# Patient Record
Sex: Female | Born: 1995 | Hispanic: Yes | Marital: Married | Smoking: Never smoker
Health system: Southern US, Community
[De-identification: ages and names within clinical notes are randomized; demographics above are authoritative.]

## PROBLEM LIST (undated history)

## (undated) DIAGNOSIS — Z789 Other specified health status: Secondary | ICD-10-CM

## (undated) DIAGNOSIS — E785 Hyperlipidemia, unspecified: Secondary | ICD-10-CM

## (undated) HISTORY — DX: Hyperlipidemia, unspecified: E78.5

## (undated) HISTORY — PX: WISDOM TOOTH EXTRACTION: SHX21

---
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
## 2015-05-11 ENCOUNTER — Encounter: Payer: Self-pay | Admitting: Pediatrics

## 2015-05-11 ENCOUNTER — Ambulatory Visit (INDEPENDENT_AMBULATORY_CARE_PROVIDER_SITE_OTHER): Payer: Medicaid Other | Admitting: Pediatrics

## 2015-05-11 VITALS — BP 100/60 | Ht 62.01 in | Wt 143.0 lb

## 2015-05-11 DIAGNOSIS — Z00121 Encounter for routine child health examination with abnormal findings: Secondary | ICD-10-CM | POA: Diagnosis not present

## 2015-05-11 DIAGNOSIS — Z113 Encounter for screening for infections with a predominantly sexual mode of transmission: Secondary | ICD-10-CM

## 2015-05-11 DIAGNOSIS — Z23 Encounter for immunization: Secondary | ICD-10-CM

## 2015-05-11 DIAGNOSIS — Z68.41 Body mass index (BMI) pediatric, 5th percentile to less than 85th percentile for age: Secondary | ICD-10-CM

## 2015-05-11 NOTE — Progress Notes (Signed)
[id number] Routine Well-Adolescent Visit  Dorreen's personal or confidential phone number: [phone]  PCP: Kela MillinBARRINO, ALETHEA Y, MD   History was provided by the patient.Patient unaccompanied  Genia Delrika Sanchez-Gonzalez is a 19 y.o. female who is here for well check.   Current concerns: none   Adolescent Assessment:  Confidentiality was discussed with the patient and if applicable, with caregiver as well.  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 Yes  Smoking: no Secondhand smoke exposure?  Drugs/EtOH: no   Sexuality:  -Menarche: age11 - females:  last menses:   - Sexually active? no  - sexual partners in last year:  - contraception use: abstinence - Last STI Screening: no  - Violence/Abuse:   Mood: Suicidality and Depression: denies depression Weapons:   Screenings: The patient completed the Rapid Assessment for Adolescent Preventive Services screening questionnaire and the following topics were identified as risk factors and discussed: mental health issues  In addition, the following topics were discussed as part of anticipatory guidance birth control.  PHQ-9 completed and results indicated mild risk for depression =scored 8 pt feels not depressed. Tired form work and school. Decline counseling   Hearing Screening   125Hz  250Hz  500Hz  1000Hz  2000Hz  4000Hz  8000Hz   Right ear:   20 20 20 20    Left ear:   20 20 20 20      Visual Acuity Screening   Right eye Left eye Both eyes  Without correction: 20/50 20/70   With correction:     Comments: Patient forgot glasses  BP 100/60 mmHg  Ht 5' 2.01" (1.575 m)  Wt 143 lb (64.864 kg)  BMI 26.15  kg/m2  BP 100/60 mmHg  Ht 5' 2.01" (1.575 m)  Wt 143 lb (64.864 kg)  BMI 26.15 kg/m2   Physical Exam:  BP 100/60 mmHg  Ht 5' 2.01" (1.575 m)  Wt 143 lb (64.864 kg)  BMI 26.15 kg/m2 Blood pressure percentiles are 20% systolic and 35% diastolic based on 2000 NHANES data. BP 100/60 mmHg  Ht 5' 2.01" (1.575 m)  Wt 143 lb (64.864 kg)  BMI 26.15 kg/m2   Objective:         General alert in NAD  Derm   no rashes or lesions  Head Normocephalic, atraumatic                    Eyes Normal, no discharge  Ears:   TMs normal bilaterally  Nose:   patent normal mucosa, turbinates normal, no rhinorhea  Oral cavity  moist mucous membranes, no lesions  Throat:   normal tonsils, without exudate or erythema  Neck:   .supple no significant adenopathy  Lungs:  clear with equal breath sounds bilaterally  Breast Tanner5  Heart:   regular rate and rhythm, no murmur  Abdomen:  soft nontender no organomegaly or masses  GU:  normal female Tanner 5  back No deformity  Extremities:   no deformity  Neuro:  intact no focal defects          Assessment/Plan: 1. Need for prophylactic vaccination and inoculation against unspecified single disease  - Hepatitis A vaccine pediatric / adolescent 2 dose IM - HPV vaccine quadravalent 3 dose IM -  Meningococcal conjugate vaccine 4-valent IM  2. Body mass index, pediatric, 5th percentile to less than 85th percentile for age  153. Routine screening for STI (sexually transmitted infection)  - GC/chlamydia probe amp, urineBMI: is appropriate for age  Immunizations today: per orders.  - Follow-up visit in 1 year for next visit, or sooner as needed.   Carma LeavenMary Jo Victoria Henshaw, MD

## 2015-05-11 NOTE — Patient Instructions (Signed)

## 2015-05-14 LAB — GC/CHLAMYDIA PROBE AMP, URINE
Chlamydia, Swab/Urine, PCR: NEGATIVE
GC Probe Amp, Urine: NEGATIVE

## 2015-08-16 ENCOUNTER — Ambulatory Visit (INDEPENDENT_AMBULATORY_CARE_PROVIDER_SITE_OTHER): Payer: Medicaid Other | Admitting: Pediatrics

## 2015-08-16 ENCOUNTER — Encounter (INDEPENDENT_AMBULATORY_CARE_PROVIDER_SITE_OTHER): Payer: Self-pay

## 2015-08-16 VITALS — Temp 97.8°F | Wt 145.2 lb

## 2015-08-16 DIAGNOSIS — Z23 Encounter for immunization: Secondary | ICD-10-CM

## 2015-08-16 NOTE — Progress Notes (Signed)
No chief complaint on file.   HPI Zoraya Sanchez-Gonzalezis here forHPV #3  History was provided by the . patient. No problems with past imminization, no concerns today  family history is not on file.   Temp(Src) 97.8 F (36.6 C)  Wt 145 lb 3.2 oz (65.862 kg)    Objective:         General alert in NAD  Derm   no rashes or lesions  Head Normocephalic, atraumatic                    Eyes Normal, no discharge  Ears:   TMs normal bilaterally  Nose:   patent normal mucosa, turbinates normal, no rhinorhea  Oral cavity  moist mucous membranes, no lesions  Throat:   normal tonsils, without exudate or erythema  Neck supple FROM  Lymph:   no significant cervicaladenopathy  Lungs:  clear with equal breath sounds bilaterally  Heart:   regular rate and rhythm, no murmur  Abdomen:  deferred  GU:  deferred  back No deformity  Extremities:   no deformity  :  i        Assessment/plan    1. Need for vaccination  - HPV 9-valent vaccine,Recombinat    Follow up  prn

## 2016-11-26 ENCOUNTER — Encounter: Payer: Self-pay | Admitting: Pediatrics

## 2016-11-27 ENCOUNTER — Encounter: Payer: Medicaid Other | Admitting: Pediatrics

## 2017-09-02 ENCOUNTER — Emergency Department (HOSPITAL_COMMUNITY)
Admission: EM | Admit: 2017-09-02 | Discharge: 2017-09-02 | Disposition: A | Discharge status: Home / Self Care | Payer: Self-pay | Attending: Emergency Medicine | Admitting: Emergency Medicine

## 2017-09-02 ENCOUNTER — Emergency Department (HOSPITAL_COMMUNITY): Payer: Self-pay

## 2017-09-02 ENCOUNTER — Encounter (HOSPITAL_COMMUNITY): Payer: Self-pay | Admitting: Emergency Medicine

## 2017-09-02 DIAGNOSIS — S60012A Contusion of left thumb without damage to nail, initial encounter: Secondary | ICD-10-CM | POA: Insufficient documentation

## 2017-09-02 DIAGNOSIS — Y939 Activity, unspecified: Secondary | ICD-10-CM | POA: Insufficient documentation

## 2017-09-02 DIAGNOSIS — Y999 Unspecified external cause status: Secondary | ICD-10-CM | POA: Insufficient documentation

## 2017-09-02 DIAGNOSIS — S60312A Abrasion of left thumb, initial encounter: Secondary | ICD-10-CM | POA: Insufficient documentation

## 2017-09-02 DIAGNOSIS — Y929 Unspecified place or not applicable: Secondary | ICD-10-CM | POA: Insufficient documentation

## 2017-09-02 DIAGNOSIS — W230XXA Caught, crushed, jammed, or pinched between moving objects, initial encounter: Secondary | ICD-10-CM | POA: Insufficient documentation

## 2017-09-02 DIAGNOSIS — R52 Pain, unspecified: Secondary | ICD-10-CM

## 2017-09-02 NOTE — Discharge Instructions (Signed)
The x-ray of your left thumb is negative for fracture or dislocation. Your examination is negative for any vascular or nerve related issue. I suspect that you have a contusion to the thumb, as well as an abrasion to the thumb. Please keep your hand elevated as much as possible. Place a clean bandage on the abrasion area daily. Please return to the verge department if any changes, problems, or concerns.
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
## 2017-09-02 NOTE — ED Triage Notes (Signed)
Pt c/o left thumb pain after slamming it in car door.

## 2017-09-02 NOTE — ED Notes (Signed)
Pt ambulatory to waiting room. Pt verbalized understanding of discharge instructions.   

## 2018-12-26 DIAGNOSIS — B360 Pityriasis versicolor: Secondary | ICD-10-CM | POA: Diagnosis not present

## 2018-12-26 DIAGNOSIS — Z01411 Encounter for gynecological examination (general) (routine) with abnormal findings: Secondary | ICD-10-CM | POA: Diagnosis not present

## 2018-12-26 DIAGNOSIS — Z6831 Body mass index (BMI) 31.0-31.9, adult: Secondary | ICD-10-CM | POA: Diagnosis not present

## 2018-12-26 DIAGNOSIS — Z1389 Encounter for screening for other disorder: Secondary | ICD-10-CM | POA: Diagnosis not present

## 2018-12-26 DIAGNOSIS — E6609 Other obesity due to excess calories: Secondary | ICD-10-CM | POA: Diagnosis not present

## 2018-12-26 DIAGNOSIS — L209 Atopic dermatitis, unspecified: Secondary | ICD-10-CM | POA: Diagnosis not present

## 2019-10-06 ENCOUNTER — Telehealth: Payer: Self-pay | Admitting: Advanced Practice Midwife

## 2019-10-06 NOTE — Telephone Encounter (Signed)

## 2019-10-07 ENCOUNTER — Encounter: Payer: Self-pay | Admitting: Women's Health

## 2019-10-07 ENCOUNTER — Ambulatory Visit (INDEPENDENT_AMBULATORY_CARE_PROVIDER_SITE_OTHER): Payer: Self-pay | Admitting: Adult Health

## 2019-10-07 ENCOUNTER — Other Ambulatory Visit: Payer: Self-pay

## 2019-10-07 VITALS — BP 129/85 | HR 95 | Ht 62.0 in | Wt 171.0 lb

## 2019-10-07 DIAGNOSIS — Z3202 Encounter for pregnancy test, result negative: Secondary | ICD-10-CM | POA: Insufficient documentation

## 2019-10-07 DIAGNOSIS — Z319 Encounter for procreative management, unspecified: Secondary | ICD-10-CM | POA: Insufficient documentation

## 2019-10-07 DIAGNOSIS — R1032 Left lower quadrant pain: Secondary | ICD-10-CM | POA: Insufficient documentation

## 2019-10-07 LAB — POCT URINE PREGNANCY: Preg Test, Ur: NEGATIVE

## 2019-10-07 NOTE — Progress Notes (Signed)
Patient ID: Catherine Park, female   DOB: 1996-05-04, 23 y.o.   MRN: [id number] History of Present Illness: Catherine Park is a 23 year old Hispanic female, married, G0P0 in complaining of pain in left ovary area for about a week,radiates to back, noticed after massage.Her periods are every 4-5 weeks, has some cramping and has spotted after sex like 2 times.She would like to get pregnant.Her husband is a Airline pilot. She works at [employer]. PCP is [place].   Current Medications, Allergies, Past Medical History, Past Surgical History, Family History and Social History were reviewed in [employer] record.     Review of Systems: +pain in left ovary area No pain with sex    Physical Exam:BP 129/85 (BP Location: Right Arm, Patient Position: Sitting, Cuff Size: Normal)   Pulse 95   Ht 5\' 2"  (1.575 m)   Wt 171 lb (77.6 kg)   LMP 10/05/2019 Comment: PMP 08/29/2019  BMI 31.28 kg/m UPT is negative.  General:  Well developed, well nourished, no acute distress Skin:  Warm and dry Neck:  Midline trachea, normal thyroid, good ROM, no lymphadenopathy Lungs; Clear to auscultation bilaterally Breast:  No dominant palpable mass, retraction, or nipple discharge Cardiovascular: Regular rate and rhythm Abdomen:  Soft, non tender, no hepatosplenomegaly Pelvic:  External genitalia is normal in appearance, no lesions.  The vagina is normal in appearance.+blood in vault Urethra has no lesions or masses. The cervix is smooth, no CMT.  Uterus is felt to be normal size, shape, and contour.  No adnexal masses. +LLQ tenderness noted.Bladder is non tender, no masses felt. Psych:  No mood changes, alert and cooperative,seems happy Fall risk is low PHQ 2 score is 0. Examination chaperoned and assisted by Weyman Croon FNP student.   Impression and Plan: 1. Pregnancy test negative   2. LLQ pain -will get GYN [place] next week to assess ovaries, will talk when results back, but suspect a cyst, and  she said her sister had to have one removed   3. Patient desires pregnancy -take PNV -can check progesterone level day 21 if -desired-discussed timing of sex, every other day 7-24 of cycle -maybe semen analysis for husband

## 2019-10-13 ENCOUNTER — Other Ambulatory Visit: Payer: Medicaid Other

## 2020-03-27 ENCOUNTER — Emergency Department (HOSPITAL_COMMUNITY)
Admission: EM | Admit: 2020-03-27 | Discharge: 2020-03-27 | Disposition: A | Discharge status: Home / Self Care | Payer: Self-pay | Attending: Emergency Medicine | Admitting: Emergency Medicine

## 2020-03-27 ENCOUNTER — Emergency Department (HOSPITAL_COMMUNITY): Payer: Self-pay

## 2020-03-27 ENCOUNTER — Encounter (HOSPITAL_COMMUNITY): Payer: Self-pay | Admitting: Emergency Medicine

## 2020-03-27 ENCOUNTER — Other Ambulatory Visit: Payer: Self-pay

## 2020-03-27 DIAGNOSIS — Y990 Civilian activity done for income or pay: Secondary | ICD-10-CM | POA: Insufficient documentation

## 2020-03-27 DIAGNOSIS — Y92512 Supermarket, store or market as the place of occurrence of the external cause: Secondary | ICD-10-CM | POA: Insufficient documentation

## 2020-03-27 DIAGNOSIS — W2209XA Striking against other stationary object, initial encounter: Secondary | ICD-10-CM | POA: Insufficient documentation

## 2020-03-27 DIAGNOSIS — Y939 Activity, unspecified: Secondary | ICD-10-CM | POA: Insufficient documentation

## 2020-03-27 DIAGNOSIS — S51812A Laceration without foreign body of left forearm, initial encounter: Secondary | ICD-10-CM | POA: Insufficient documentation

## 2020-03-27 DIAGNOSIS — S5002XA Contusion of left elbow, initial encounter: Secondary | ICD-10-CM | POA: Insufficient documentation

## 2020-03-27 DIAGNOSIS — Z23 Encounter for immunization: Secondary | ICD-10-CM | POA: Insufficient documentation

## 2020-03-27 LAB — POC URINE PREG, ED: Preg Test, Ur: NEGATIVE

## 2020-03-27 MED ORDER — IBUPROFEN 600 MG PO TABS: 600.0000 mg | ORAL_TABLET | Freq: Four times a day (QID) | ORAL | 0 refills | Status: DC | PRN

## 2020-03-27 MED ORDER — LIDOCAINE HCL (PF) 2 % IJ SOLN
5.0000 mL | Freq: Once | INTRAMUSCULAR | Status: AC
  Administered 2020-03-27: 5 mL

## 2020-03-27 MED ORDER — TETANUS-DIPHTH-ACELL PERTUSSIS 5-2.5-18.5 LF-MCG/0.5 IM SUSP
0.5000 mL | Freq: Once | INTRAMUSCULAR | Status: AC
  Administered 2020-03-27: 18:00:00 0.5 mL via INTRAMUSCULAR
  Filled 2020-03-27: qty 0.5

## 2020-03-27 NOTE — ED Notes (Signed)
To Rad 

## 2020-03-27 NOTE — ED Notes (Signed)
Out of bed to BR with easy, fluid gait
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
## 2020-03-27 NOTE — ED Notes (Signed)
From rad   JI PA in to evaluate
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
## 2020-03-27 NOTE — Discharge Instructions (Addendum)
Have your sutures removed in 10 days.  Keep your wound clean and dry,  Until a good scab forms - you may then wash gently twice daily with mild soap and water, but dry completely after.  Get rechecked for any sign of infection (redness,  Swelling,  Increased pain or drainage of purulent fluid).  Use ice packs as much as is comfortable for your aching joints and elbow hematoma - this will help with pain and swelling.
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
## 2020-03-31 ENCOUNTER — Telehealth: Payer: Self-pay | Admitting: Adult Health

## 2020-03-31 NOTE — Telephone Encounter (Signed)
Tried to reach the patient to remind her of her appointment/restrictions, mailbox is full. 

## 2020-04-01 ENCOUNTER — Encounter: Payer: Self-pay | Admitting: Adult Health

## 2020-04-01 ENCOUNTER — Ambulatory Visit (INDEPENDENT_AMBULATORY_CARE_PROVIDER_SITE_OTHER): Payer: Self-pay | Admitting: Adult Health

## 2020-04-01 ENCOUNTER — Other Ambulatory Visit: Payer: Self-pay

## 2020-04-01 VITALS — BP 111/79 | HR 81 | Ht 62.0 in | Wt 172.0 lb

## 2020-04-01 DIAGNOSIS — N760 Acute vaginitis: Secondary | ICD-10-CM

## 2020-04-01 DIAGNOSIS — N930 Postcoital and contact bleeding: Secondary | ICD-10-CM

## 2020-04-01 DIAGNOSIS — R1032 Left lower quadrant pain: Secondary | ICD-10-CM

## 2020-04-01 DIAGNOSIS — Z3202 Encounter for pregnancy test, result negative: Secondary | ICD-10-CM

## 2020-04-01 DIAGNOSIS — N926 Irregular menstruation, unspecified: Secondary | ICD-10-CM

## 2020-04-01 DIAGNOSIS — B9689 Other specified bacterial agents as the cause of diseases classified elsewhere: Secondary | ICD-10-CM

## 2020-04-01 LAB — POCT WET PREP (WET MOUNT)
Clue Cells Wet Prep Whiff POC: NEGATIVE
WBC, Wet Prep HPF POC: POSITIVE

## 2020-04-01 LAB — POCT URINE PREGNANCY: Preg Test, Ur: NEGATIVE

## 2020-04-01 MED ORDER — METRONIDAZOLE 500 MG PO TABS: 500.0000 mg | ORAL_TABLET | Freq: Two times a day (BID) | ORAL | 0 refills | Status: DC
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
## 2020-04-04 ENCOUNTER — Telehealth: Payer: Self-pay | Admitting: Obstetrics & Gynecology

## 2020-04-04 NOTE — Telephone Encounter (Signed)

## 2020-04-06 ENCOUNTER — Ambulatory Visit (INDEPENDENT_AMBULATORY_CARE_PROVIDER_SITE_OTHER): Payer: Self-pay

## 2020-04-06 ENCOUNTER — Other Ambulatory Visit: Payer: Self-pay

## 2020-04-06 DIAGNOSIS — R1032 Left lower quadrant pain: Secondary | ICD-10-CM

## 2020-04-06 NOTE — Progress Notes (Signed)
PELVIC US TA/TV: homogeneous anteverted uterus,wnl,EEC 4.8 mm, normal ovaries,ovaries appear mobile,no free fluid,no pain during ultrasound

## 2020-04-07 ENCOUNTER — Telehealth: Payer: Self-pay | Admitting: Adult Health

## 2020-04-07 NOTE — Telephone Encounter (Signed)
Pt aware that GYN [place] was normal.

## 2020-10-16 IMAGING — DX DG SHOULDER 2+V*L*
3 series · 3 of 3 positions shown · non-contrast
Comparison: None.

CLINICAL DATA: Laceration

EXAM:
LEFT SHOULDER - 2+ VIEW

[shoulder grashey]
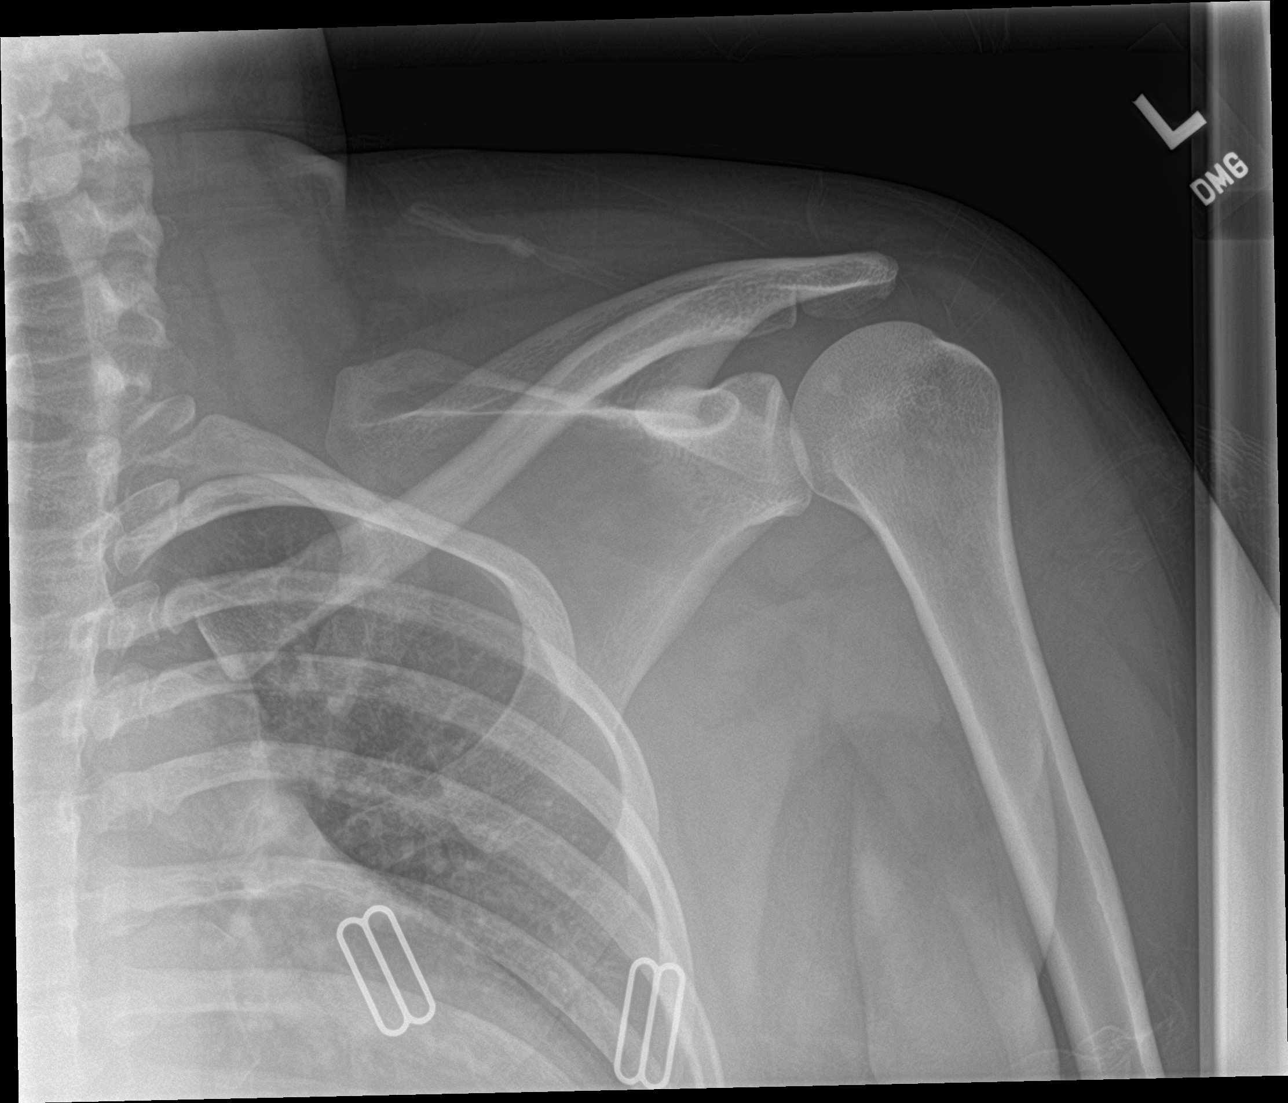

[shoulder y view]
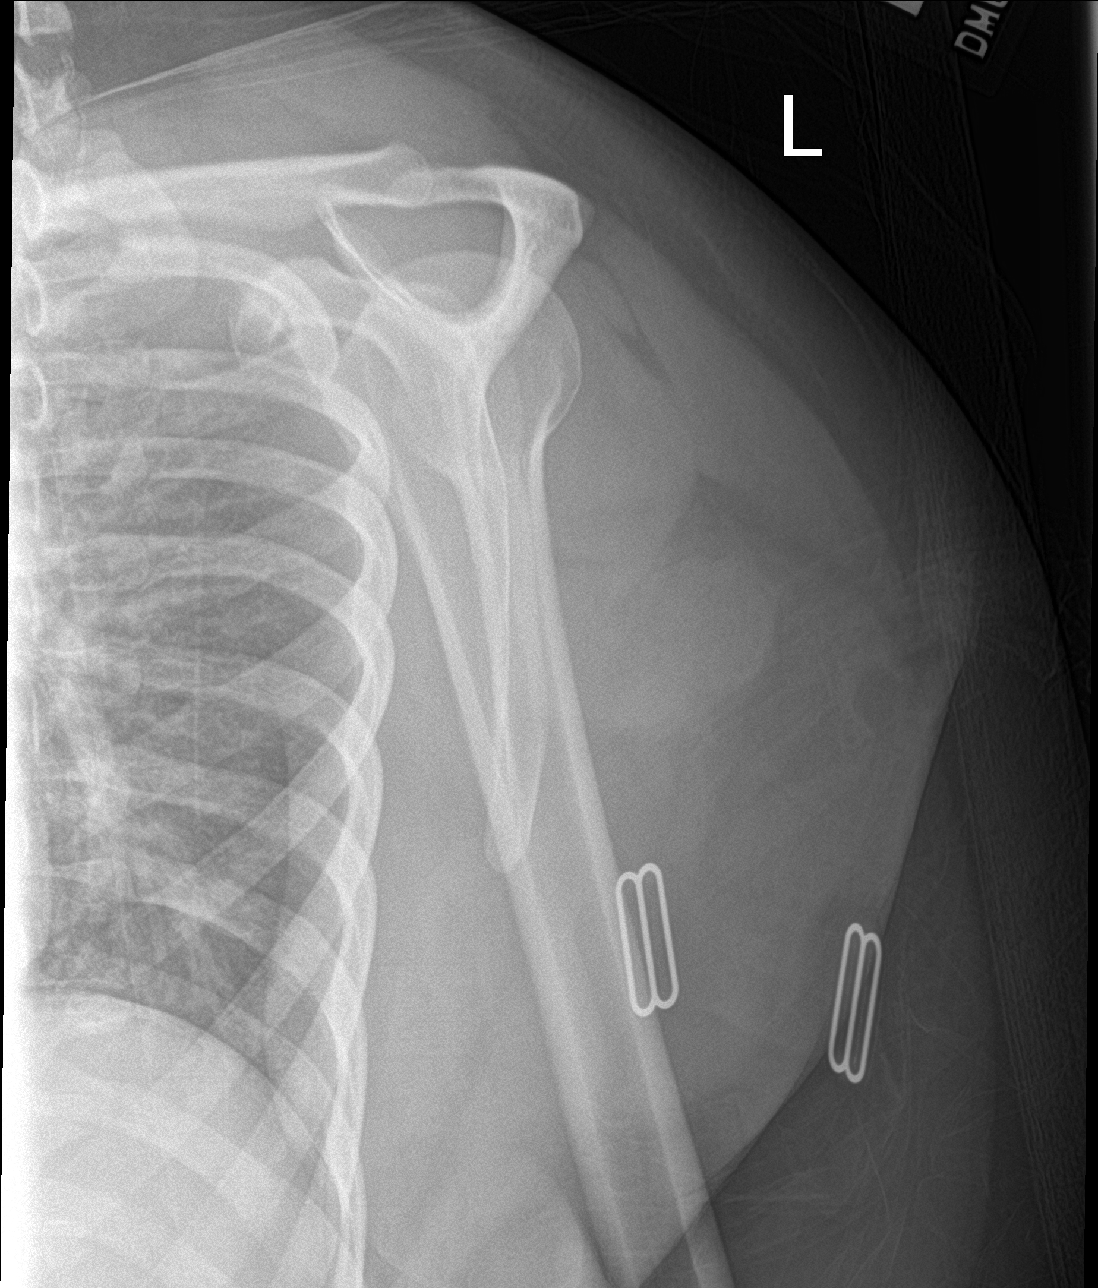

[shoulder axillary]
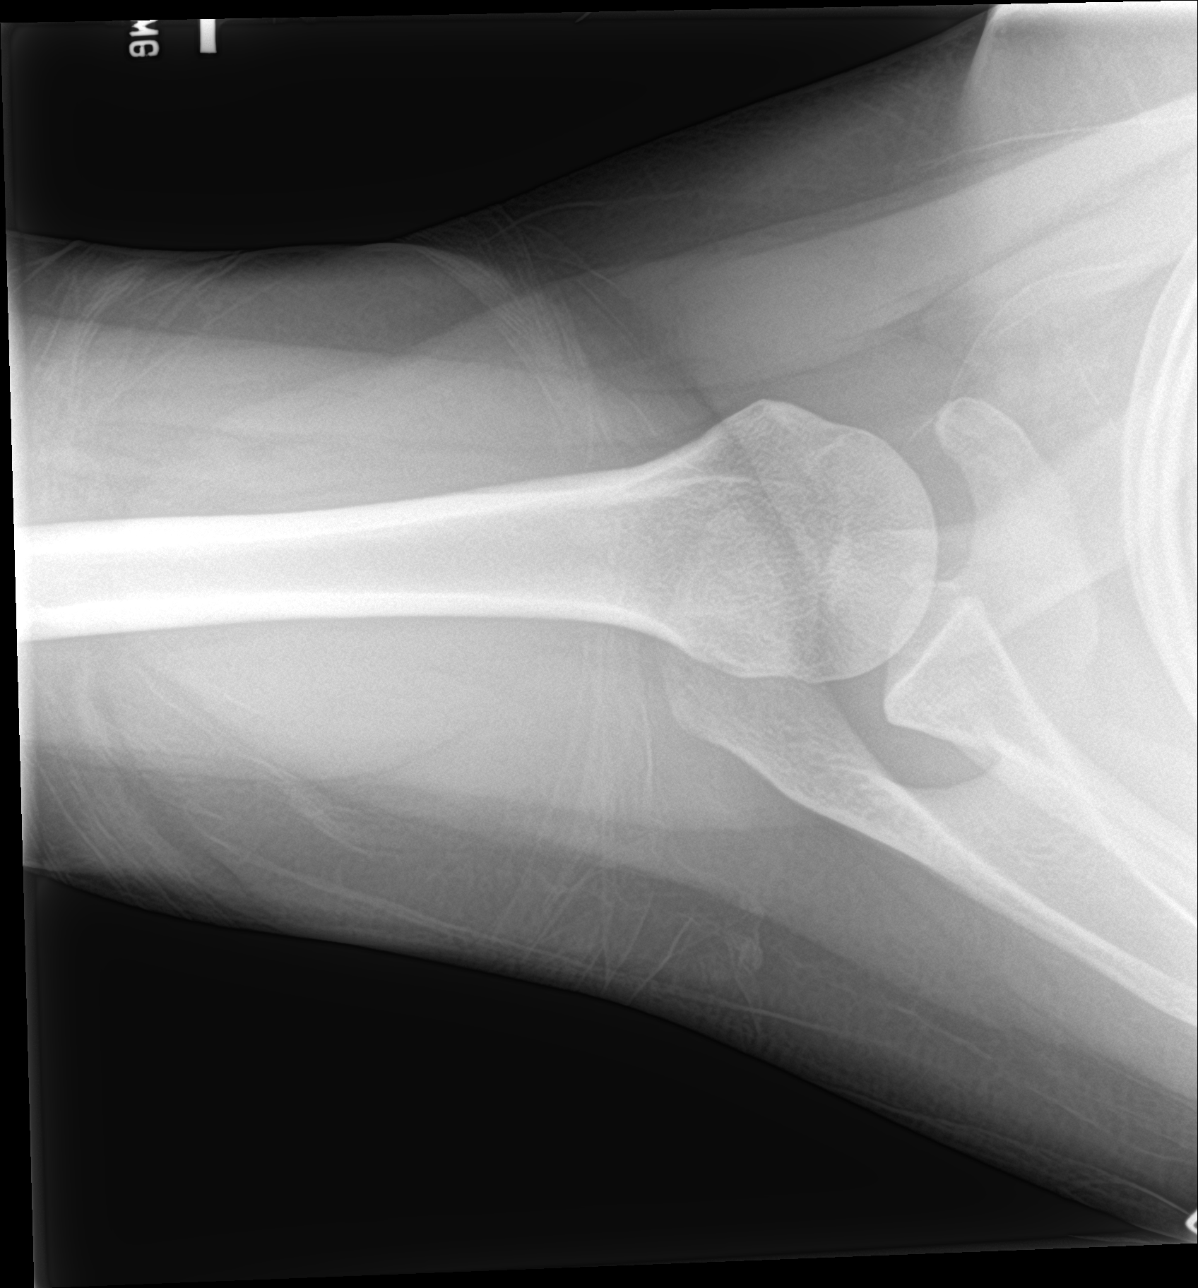

[3 of 3 positions shown; findings below may reference images not displayed]

FINDINGS: There is no evidence of fracture or dislocation. There is no
evidence of arthropathy or other focal bone abnormality. Soft
tissues are unremarkable.
IMPRESSION: No fracture or dislocation of the left shoulder.

## 2020-10-16 IMAGING — DX DG HIP (WITH OR WITHOUT PELVIS) 3-4V BILAT
5 series · 5 of 5 positions shown · non-contrast
Comparison: None.

CLINICAL DATA: Laceration

EXAM:
DG HIP (WITH OR WITHOUT PELVIS) 3-4V BILAT

[pelvis ap]
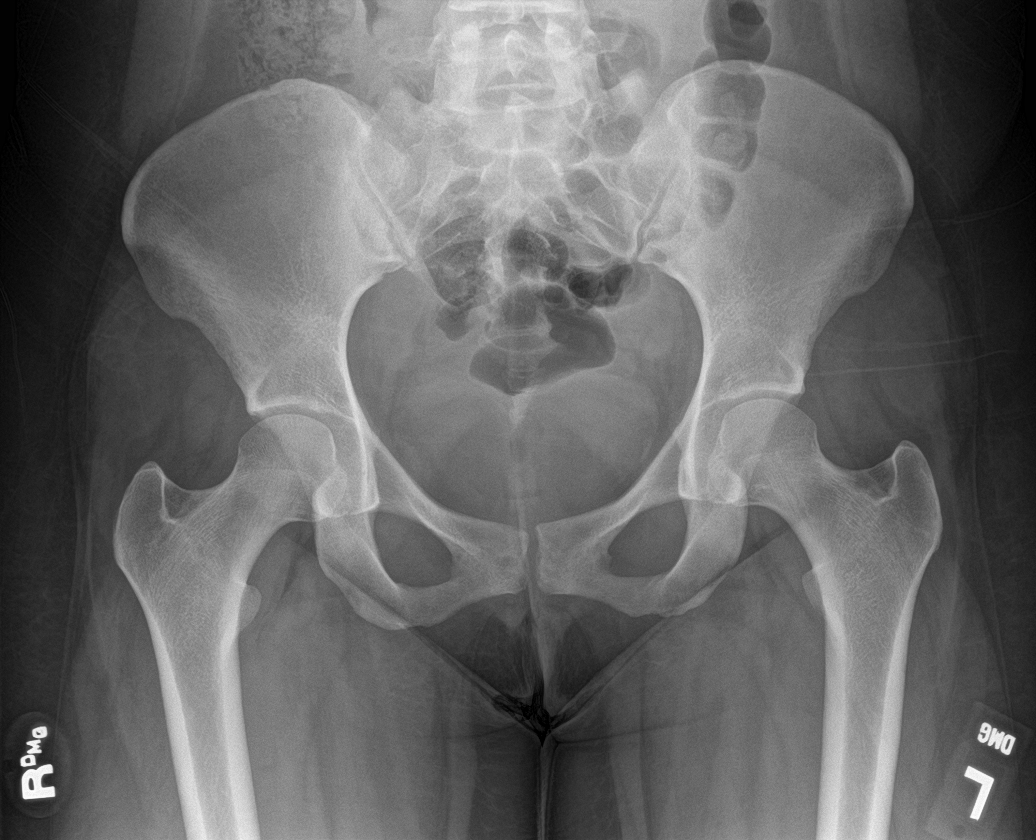

[hip ap (1 of 2)]
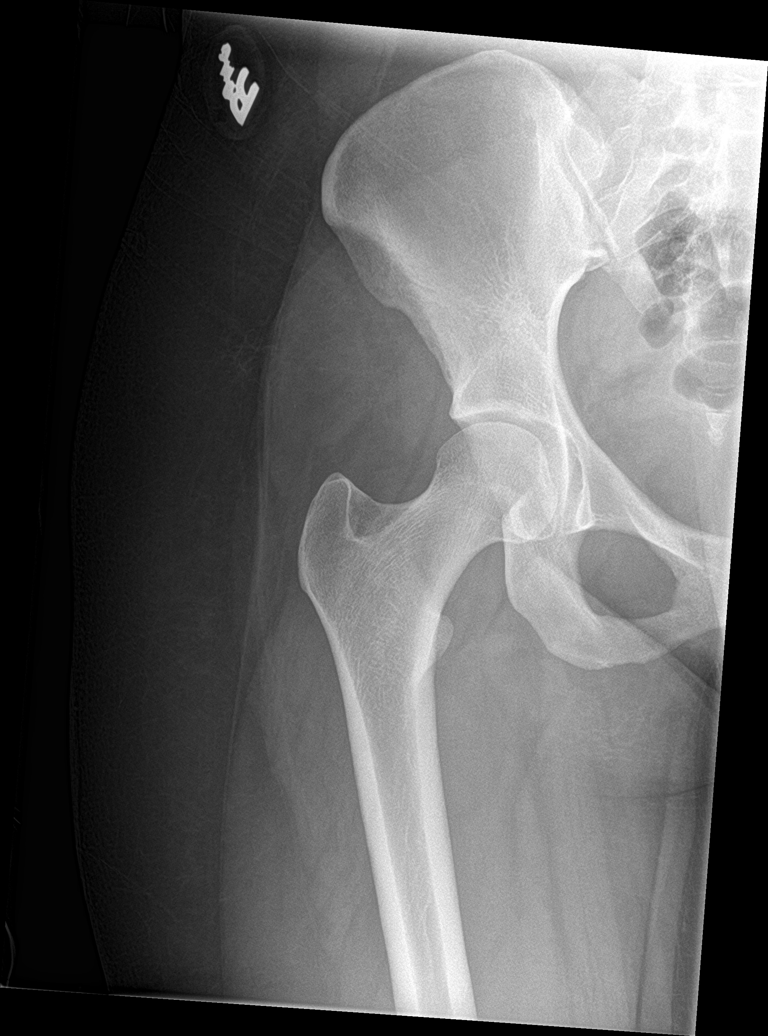

[hip lat (1 of 2)]
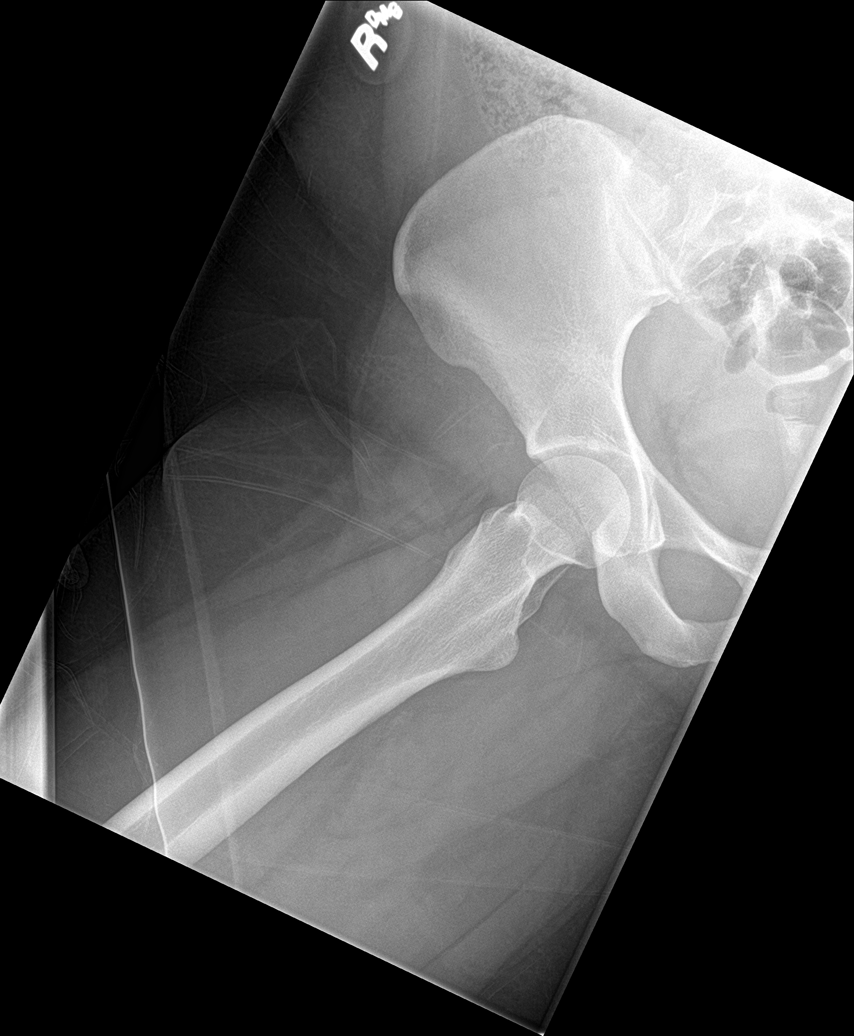

[hip ap (2 of 2)]
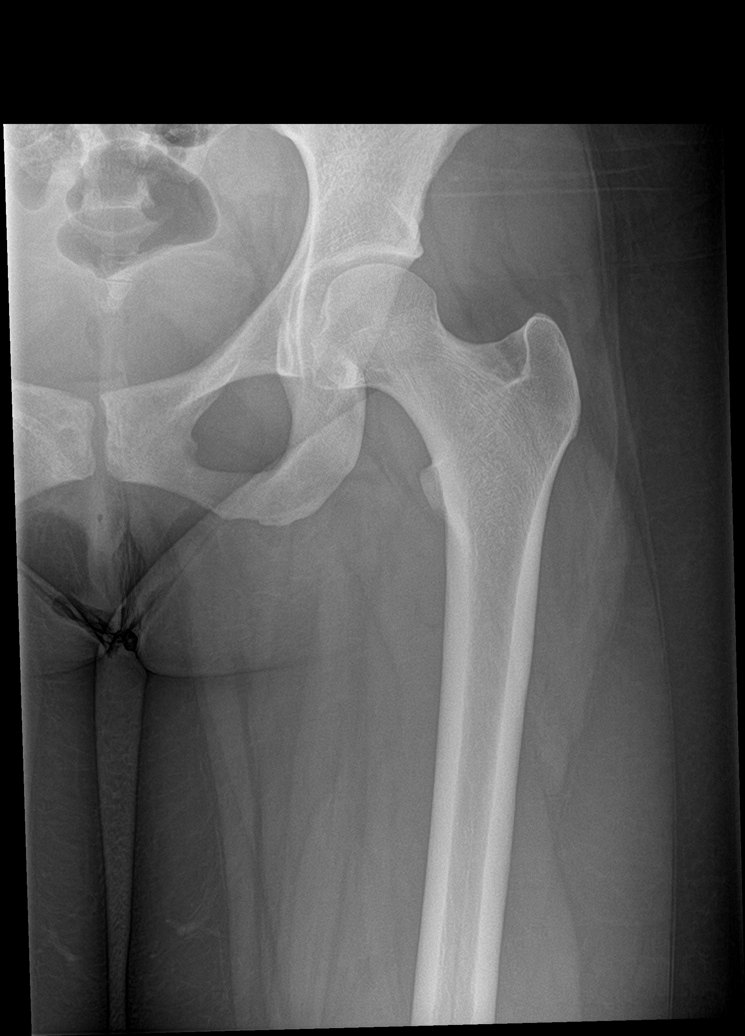

[hip lat (2 of 2)]
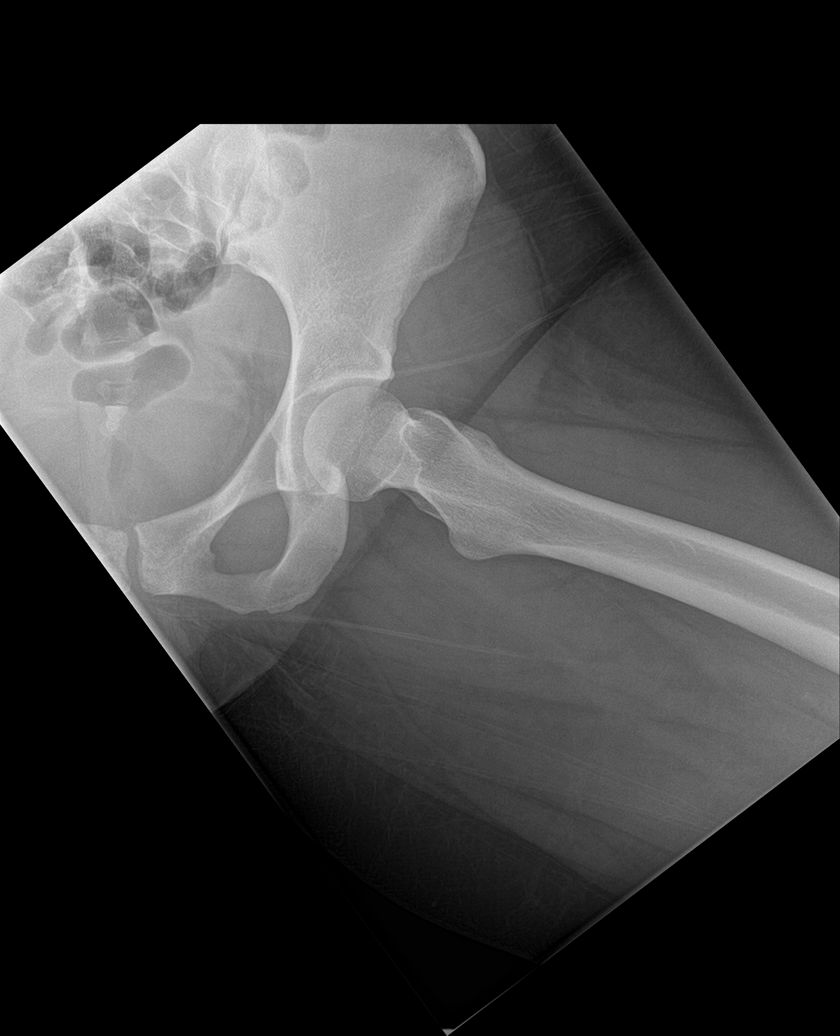

[5 of 5 positions shown; findings below may reference images not displayed]

FINDINGS: There is no evidence of hip fracture or dislocation. There is no
evidence of arthropathy or other focal bone abnormality.
IMPRESSION: No fracture or dislocation of the hips or pelvis.

## 2020-12-24 NOTE — L&D Delivery Note (Signed)
LABOR COURSE Catherine Park is a 25 y.o. female G1P0000 with IUP at [redacted]w[redacted]d by 8 week [place] presenting for SOL. She progressed in spontaneous labor and also underwent AROM.   Delivery Note Called to room and patient was complete and pushing. Head delivered spontaneously. Loose nuchal cord present, delivered through. Shoulder and body delivered in usual fashion. At  0847 a viable and healthy female was delivered via Vaginal, Spontaneous (Presentation: vertex; LOA).  Infant with spontaneous cry, placed on mother's abdomen, dried and stimulated. Cord clamped x 2 after 1-minute delay, and cut by FOB. Cord blood drawn. Placenta delivered spontaneously with gentle cord traction. Appears intact. Fundus firm with massage and Pitocin. Labia, perineum, vagina, and cervix inspected with right vaginal wall abrasion not requiring repair.    APGAR: 9, 10; weight  pending.   Cord: 3VC with the following complications:none.   Cord pH: n/a  Anesthesia: None Episiotomy: None Lacerations: Right vaginal wall abrasion, no repair needed Suture Repair:  n/a Est. Blood Loss (mL): 100  Mom to postpartum.  Baby to Couplet care / Skin to Skin.  Reeves Forth, MD 11/07/21 9:51 AM

## 2021-03-22 ENCOUNTER — Other Ambulatory Visit: Payer: Self-pay

## 2021-03-22 ENCOUNTER — Ambulatory Visit (INDEPENDENT_AMBULATORY_CARE_PROVIDER_SITE_OTHER): Payer: Self-pay

## 2021-03-22 VITALS — BP 128/83 | HR 88 | Ht 62.0 in | Wt 167.6 lb

## 2021-03-22 DIAGNOSIS — N926 Irregular menstruation, unspecified: Secondary | ICD-10-CM

## 2021-03-22 LAB — POCT URINE PREGNANCY: Preg Test, Ur: POSITIVE — AB

## 2021-03-22 NOTE — Progress Notes (Addendum)
   NURSE VISIT- PREGNANCY CONFIRMATION   SUBJECTIVE:  Catherine Park is a 25 y.o. G28P0000 female at [redacted]w[redacted]d by certain LMP of Patient's last menstrual period was 01/30/2021 (exact date). Here for pregnancy confirmation.  Home pregnancy test: positive x 2  She reports cramping.  She is not taking prenatal vitamins.    OBJECTIVE:  BP 128/83 (BP Location: Right Arm, Patient Position: Sitting, Cuff Size: Normal)   Pulse 88   Ht 5\' 2"  (1.575 m)   Wt 167 lb 9.6 oz (76 kg)   LMP 01/30/2021 (Exact Date)   BMI 30.65 kg/m   Appears well, in no apparent distress OB History  Gravida Para Term Preterm AB Living  1 0 0 0 0 0  SAB IAB Ectopic Multiple Live Births  0 0 0 0 0    # Outcome Date GA Lbr Len/2nd Weight Sex Delivery Anes PTL Lv  1 Current             Results for orders placed or performed in visit on 03/22/21 (from the past 24 hour(s))  POCT urine pregnancy   Collection Time: 03/22/21  4:20 PM  Result Value Ref Range   Preg Test, Ur Positive (A) Negative    ASSESSMENT: Positive pregnancy test, [redacted]w[redacted]d by LMP    PLAN: Schedule for dating ultrasound in 1 week Prenatal vitamins: plans to begin OTC ASAP   Nausea medicines: not currently needed   OB packet given: Yes  Bain Whichard A Lorrin Bodner  03/22/2021 4:29 PM   Chart reviewed for nurse visit. Agree with plan of care.  03/24/2021, Cheral Marker 03/23/2021 9:43 AM

## 2021-03-24 DIAGNOSIS — Z419 Encounter for procedure for purposes other than remedying health state, unspecified: Secondary | ICD-10-CM | POA: Diagnosis not present

## 2021-04-05 ENCOUNTER — Other Ambulatory Visit: Payer: Self-pay | Admitting: Obstetrics & Gynecology

## 2021-04-05 ENCOUNTER — Ambulatory Visit (INDEPENDENT_AMBULATORY_CARE_PROVIDER_SITE_OTHER): Payer: Medicaid Other

## 2021-04-05 ENCOUNTER — Other Ambulatory Visit: Payer: Self-pay

## 2021-04-05 DIAGNOSIS — O3680X0 Pregnancy with inconclusive fetal viability, not applicable or unspecified: Secondary | ICD-10-CM | POA: Diagnosis not present

## 2021-04-05 NOTE — Progress Notes (Signed)
[place] 8+2 wks,single IUP w/ys,fht 162 bpm,normal ovaries,crl 18.33 mm

## 2021-04-06 ENCOUNTER — Other Ambulatory Visit: Payer: Medicaid Other

## 2021-04-23 DIAGNOSIS — Z419 Encounter for procedure for purposes other than remedying health state, unspecified: Secondary | ICD-10-CM | POA: Diagnosis not present

## 2021-04-24 DIAGNOSIS — Z3401 Encounter for supervision of normal first pregnancy, first trimester: Secondary | ICD-10-CM | POA: Diagnosis not present

## 2021-05-09 ENCOUNTER — Other Ambulatory Visit: Payer: Self-pay | Admitting: Obstetrics & Gynecology

## 2021-05-09 DIAGNOSIS — Z3400 Encounter for supervision of normal first pregnancy, unspecified trimester: Secondary | ICD-10-CM | POA: Insufficient documentation

## 2021-05-09 DIAGNOSIS — Z3682 Encounter for antenatal screening for nuchal translucency: Secondary | ICD-10-CM

## 2021-05-10 ENCOUNTER — Encounter: Payer: Self-pay | Admitting: Advanced Practice Midwife

## 2021-05-10 ENCOUNTER — Ambulatory Visit (INDEPENDENT_AMBULATORY_CARE_PROVIDER_SITE_OTHER): Payer: Medicaid Other | Admitting: Advanced Practice Midwife

## 2021-05-10 ENCOUNTER — Other Ambulatory Visit: Payer: Self-pay

## 2021-05-10 ENCOUNTER — Ambulatory Visit: Payer: Medicaid Other | Admitting: *Deleted

## 2021-05-10 ENCOUNTER — Other Ambulatory Visit: Payer: Medicaid Other

## 2021-05-10 VITALS — BP 132/55 | HR 82 | Wt 164.0 lb

## 2021-05-10 DIAGNOSIS — Z3A13 13 weeks gestation of pregnancy: Secondary | ICD-10-CM

## 2021-05-10 DIAGNOSIS — Z3401 Encounter for supervision of normal first pregnancy, first trimester: Secondary | ICD-10-CM

## 2021-05-10 LAB — POCT URINALYSIS DIPSTICK OB
Blood, UA: NEGATIVE
Glucose, UA: NEGATIVE
Ketones, UA: NEGATIVE
Leukocytes, UA: NEGATIVE
Nitrite, UA: NEGATIVE
POC,PROTEIN,UA: NEGATIVE

## 2021-05-10 MED ORDER — BLOOD PRESSURE MONITOR MISC: 0 refills | Status: DC

## 2021-05-10 NOTE — Patient Instructions (Signed)
Catherine Park, I greatly value your feedback.  If you receive a survey following your visit with [place] today, we appreciate you taking the time to fill it out.  Thanks, [name] CNM   [facility] ([address] [place], [place] [address]) Entrance C, located off of [address] Free 24/7 valet parking   Nausea & Vomiting  Have saltine crackers or pretzels by your bed and eat a few bites before you raise your head out of bed in the morning  Eat small frequent meals throughout the day instead of large meals  Drink plenty of fluids throughout the day to stay hydrated, just don't drink a lot of fluids with your meals.  This can make your stomach fill up faster making you feel sick  Do not brush your teeth right after you eat  Products with real ginger are good for nausea, like ginger ale and ginger hard candy Make sure it says made with real ginger!  Sucking on sour candy like lemon heads is also good for nausea  If your prenatal vitamins make you nauseated, take them at night so you will sleep through the nausea  Sea Bands  If you feel like you need medicine for the nausea & vomiting please let us know  If you are unable to keep any fluids or food down please let us know   Constipation  Drink plenty of fluid, preferably water, throughout the day  Eat foods high in fiber such as fruits, vegetables, and grains  Exercise, such as walking, is a good way to keep your bowels regular  Drink warm fluids, especially warm prune juice, or decaf coffee  Eat a 1/2 cup of real oatmeal (not instant), 1/2 cup applesauce, and 1/2-1 cup warm prune juice every day  If needed, you may take Colace (docusate sodium) stool softener once or twice a day to help keep the stool soft.   If you still are having problems with constipation, you may take Miralax once daily as needed to help keep your bowels regular.   Home Blood Pressure Monitoring for Patients   Your  provider has recommended that you check your blood pressure (BP) at least once a week at home. If you do not have a blood pressure cuff at home, one will be provided for you. Contact your provider if you have not received your monitor within 1 week.   Helpful Tips for Accurate Home Blood Pressure Checks  . Don't smoke, exercise, or drink caffeine 30 minutes before checking your BP . Use the restroom before checking your BP (a full bladder can raise your pressure) . Relax in a comfortable upright chair . Feet on the ground . Left arm resting comfortably on a flat surface at the level of your heart . Legs uncrossed . Back supported . Sit quietly and don't talk . Place the cuff on your bare arm . Adjust snuggly, so that only two fingertips can fit between your skin and the top of the cuff . Check 2 readings separated by at least one minute . Keep a log of your BP readings . For a visual, please reference this diagram: http://ccnc.care/bpdiagram  Provider Name: [facility]     Phone: [phone]  Zone 1: ALL CLEAR  Continue to monitor your symptoms:  . BP reading is less than 140 (top number) or less than 90 (bottom number)  . No right upper stomach pain . No headaches or seeing spots .  No feeling nauseated or throwing up . No swelling in face and hands  Zone 2: CAUTION Call your doctor's office for any of the following:  . BP reading is greater than 140 (top number) or greater than 90 (bottom number)  . Stomach pain under your ribs in the middle or right side . Headaches or seeing spots . Feeling nauseated or throwing up . Swelling in face and hands  Zone 3: EMERGENCY  Seek immediate medical care if you have any of the following:  . BP reading is greater than160 (top number) or greater than 110 (bottom number) . Severe headaches not improving with Tylenol . Serious difficulty catching your breath . Any worsening symptoms from Zone 2    First Trimester of Pregnancy The  first trimester of pregnancy is from week 1 until the end of week 12 (months 1 through 3). A week after a sperm fertilizes an egg, the egg will implant on the wall of the uterus. This embryo will begin to develop into a baby. Genes from you and your partner are forming the baby. The female genes determine whether the baby is a boy or a girl. At 6-8 weeks, the eyes and face are formed, and the heartbeat can be seen on ultrasound. At the end of 12 weeks, all the baby's organs are formed.  Now that you are pregnant, you will want to do everything you can to have a healthy baby. Two of the most important things are to get good prenatal care and to follow your health care provider's instructions. Prenatal care is all the medical care you receive before the baby's birth. This care will help prevent, find, and treat any problems during the pregnancy and childbirth. BODY CHANGES Your body goes through many changes during pregnancy. The changes vary from woman to woman.   You may gain or lose a couple of pounds at first.  You may feel sick to your stomach (nauseous) and throw up (vomit). If the vomiting is uncontrollable, call your health care provider.  You may tire easily.  You may develop headaches that can be relieved by medicines approved by your health care provider.  You may urinate more often. Painful urination may mean you have a bladder infection.  You may develop heartburn as a result of your pregnancy.  You may develop constipation because certain hormones are causing the muscles that push waste through your intestines to slow down.  You may develop hemorrhoids or swollen, bulging veins (varicose veins).  Your breasts may begin to grow larger and become tender. Your nipples may stick out more, and the tissue that surrounds them (areola) may become darker.  Your gums may bleed and may be sensitive to brushing and flossing.  Dark spots or blotches (chloasma, mask of pregnancy) may develop on  your face. This will likely fade after the baby is born.  Your menstrual periods will stop.  You may have a loss of appetite.  You may develop cravings for certain kinds of food.  You may have changes in your emotions from day to day, such as being excited to be pregnant or being concerned that something may go wrong with the pregnancy and baby.  You may have more vivid and strange dreams.  You may have changes in your hair. These can include thickening of your hair, rapid growth, and changes in texture. Some women also have hair loss during or after pregnancy, or hair that feels dry or thin. Your hair will most  likely return to normal after your baby is born. WHAT TO EXPECT AT YOUR PRENATAL VISITS During a routine prenatal visit:  You will be weighed to make sure you and the baby are growing normally.  Your blood pressure will be taken.  Your abdomen will be measured to track your baby's growth.  The fetal heartbeat will be listened to starting around week 10 or 12 of your pregnancy.  Test results from any previous visits will be discussed. Your health care provider may ask you:  How you are feeling.  If you are feeling the baby move.  If you have had any abnormal symptoms, such as leaking fluid, bleeding, severe headaches, or abdominal cramping.  If you have any questions. Other tests that may be performed during your first trimester include:  Blood tests to find your blood type and to check for the presence of any previous infections. They will also be used to check for low iron levels (anemia) and Rh antibodies. Later in the pregnancy, blood tests for diabetes will be done along with other tests if problems develop.  Urine tests to check for infections, diabetes, or protein in the urine.  An ultrasound to confirm the proper growth and development of the baby.  An amniocentesis to check for possible genetic problems.  Fetal screens for spina bifida and Down  syndrome.  You may need other tests to make sure you and the baby are doing well. HOME CARE INSTRUCTIONS  Medicines  Follow your health care provider's instructions regarding medicine use. Specific medicines may be either safe or unsafe to take during pregnancy.  Take your prenatal vitamins as directed.  If you develop constipation, try taking a stool softener if your health care provider approves. Diet  Eat regular, well-balanced meals. Choose a variety of foods, such as meat or vegetable-based protein, fish, milk and low-fat dairy products, vegetables, fruits, and whole grain breads and cereals. Your health care provider will help you determine the amount of weight gain that is right for you.  Avoid raw meat and uncooked cheese. These carry germs that can cause birth defects in the baby.  Eating four or five small meals rather than three large meals a day may help relieve nausea and vomiting. If you start to feel nauseous, eating a few soda crackers can be helpful. Drinking liquids between meals instead of during meals also seems to help nausea and vomiting.  If you develop constipation, eat more high-fiber foods, such as fresh vegetables or fruit and whole grains. Drink enough fluids to keep your urine clear or pale yellow. Activity and Exercise  Exercise only as directed by your health care provider. Exercising will help you:  Control your weight.  Stay in shape.  Be prepared for labor and delivery.  Experiencing pain or cramping in the lower abdomen or low back is a good sign that you should stop exercising. Check with your health care provider before continuing normal exercises.  Try to avoid standing for long periods of time. Move your legs often if you must stand in one place for a long time.  Avoid heavy lifting.  Wear low-heeled shoes, and practice good posture.  You may continue to have sex unless your health care provider directs you otherwise. Relief of Pain or  Discomfort  Wear a good support bra for breast tenderness.    Take warm sitz baths to soothe any pain or discomfort caused by hemorrhoids. Use hemorrhoid cream if your health care provider approves.  Rest with your legs elevated if you have leg cramps or low back pain.  If you develop varicose veins in your legs, wear support hose. Elevate your feet for 15 minutes, 3-4 times a day. Limit salt in your diet. Prenatal Care  Schedule your prenatal visits by the twelfth week of pregnancy. They are usually scheduled monthly at first, then more often in the last 2 months before delivery.  Write down your questions. Take them to your prenatal visits.  Keep all your prenatal visits as directed by your health care provider. Safety  Wear your seat belt at all times when driving.  Make a list of emergency phone numbers, including numbers for family, friends, the hospital, and police and fire departments. General Tips  Ask your health care provider for a referral to a local prenatal education class. Begin classes no later than at the beginning of month 6 of your pregnancy.  Ask for help if you have counseling or nutritional needs during pregnancy. Your health care provider can offer advice or refer you to specialists for help with various needs.  Do not use hot tubs, steam rooms, or saunas.  Do not douche or use tampons or scented sanitary pads.  Do not cross your legs for long periods of time.  Avoid cat litter boxes and soil used by cats. These carry germs that can cause birth defects in the baby and possibly loss of the fetus by miscarriage or stillbirth.  Avoid all smoking, herbs, alcohol, and medicines not prescribed by your health care provider. Chemicals in these affect the formation and growth of the baby.  Schedule a dentist appointment. At home, brush your teeth with a soft toothbrush and be gentle when you floss. SEEK MEDICAL CARE IF:   You have dizziness.  You have mild  pelvic cramps, pelvic pressure, or nagging pain in the abdominal area.  You have persistent nausea, vomiting, or diarrhea.  You have a bad smelling vaginal discharge.  You have pain with urination.  You notice increased swelling in your face, hands, legs, or ankles. SEEK IMMEDIATE MEDICAL CARE IF:   You have a fever.  You are leaking fluid from your vagina.  You have spotting or bleeding from your vagina.  You have severe abdominal cramping or pain.  You have rapid weight gain or loss.  You vomit blood or material that looks like coffee grounds.  You are exposed to [place] measles and have never had them.  You are exposed to fifth disease or chickenpox.  You develop a severe headache.  You have shortness of breath.  You have any kind of trauma, such as from a fall or a car accident. Document Released: 12/04/2001 Document Revised: 04/26/2014 Document Reviewed: 10/20/2013 [facility] Patient Information 2015 [place], [place]. This information is not intended to replace advice given to you by your health care provider. Make sure you discuss any questions you have with your health care provider.

## 2021-05-10 NOTE — Progress Notes (Signed)
INITIAL OBSTETRICAL VISIT Patient name: Catherine Park MRN [id number]  Date of birth: 25-Apr-1996 Chief Complaint:   Initial Prenatal Visit  History of Present Illness:   Dimitria Ketchum is a 25 y.o. G15P0000 Hispanic female at [redacted]w[redacted]d by [place] at 8.2 weeks with an Estimated Date of Delivery: 11/13/21 being seen today for her initial obstetrical visit.   Her obstetrical history is significant for G1.   Today she reports no complaints.  Depression screen [facility] 2/9 05/10/2021 10/07/2019  Decreased Interest 0 0  Down, Depressed, Hopeless 0 0  PHQ - 2 Score 0 0  Altered sleeping 1 -  Tired, decreased energy 0 -  Change in appetite 1 -  Feeling bad or failure about yourself  0 -  Trouble concentrating 1 -  Moving slowly or fidgety/restless 0 -  Suicidal thoughts 0 -  PHQ-9 Score 3 -    Patient's last menstrual period was 01/30/2021 (exact date). Last pap early 2020. Results were: neg per pt; done at [facility] and records requested Review of Systems:   Pertinent items are noted in HPI Denies cramping/contractions, leakage of fluid, vaginal bleeding, abnormal vaginal discharge w/ itching/odor/irritation, headaches, visual changes, shortness of breath, chest pain, abdominal pain, severe nausea/vomiting, or problems with urination or bowel movements unless otherwise stated above.  Pertinent History Reviewed:  Reviewed past medical,surgical, social, obstetrical and family history.  Reviewed problem list, medications and allergies. OB History  Gravida Para Term Preterm AB Living  1 0 0 0 0 0  SAB IAB Ectopic Multiple Live Births  0 0 0 0 0    # Outcome Date GA Lbr Len/2nd Weight Sex Delivery Anes PTL Lv  1 Current            Physical Assessment:   Vitals:   05/10/21 1439  BP: (!) 132/55  Pulse: 82  Weight: 164 lb (74.4 kg)  Body mass index is 30 kg/m.       Physical Examination:  General appearance - well appearing, and in no distress  Mental status - alert, oriented to  person, place, and time  Psych:  She has a normal mood and affect  Skin - warm and dry, normal color, no suspicious lesions noted  Chest - effort normal, all lung fields clear to auscultation bilaterally  Heart - normal rate and regular rhythm  Abdomen - soft, nontender; FHT dopplered 148bpm  Extremities:  No swelling or varicosities noted  Pelvic - not indicated  Thin prep pap is not done   TODAY'S NT : unable to obtain  Results for orders placed or performed in visit on 05/10/21 (from the past 24 hour(s))  POC Urinalysis Dipstick OB   Collection Time: 05/10/21  3:32 PM  Result Value Ref Range   Color, UA     Clarity, UA     Glucose, UA Negative Negative   Bilirubin, UA     Ketones, UA neg    Spec Grav, UA     Blood, UA neg    pH, UA     POC,PROTEIN,UA Negative Negative, Trace, Small (1+), Moderate (2+), Large (3+), 4+   Urobilinogen, UA     Nitrite, UA neg    Leukocytes, UA Negative Negative   Appearance     Odor      Assessment & Plan:  1) Low-Risk Pregnancy G1P0000 at [redacted]w[redacted]d with an Estimated Date of Delivery: 11/13/21   2) Initial OB visit  Meds:  Meds ordered this encounter  Medications  . Blood Pressure  Monitor MISC    Sig: For regular home bp monitoring during pregnancy    Dispense:  1 each    Refill:  0    Z34.13 Please mail to patient    Initial labs obtained Continue prenatal vitamins Reviewed n/v relief measures and warning s/s to report Reviewed recommended weight gain based on pre-gravid BMI Encouraged well-balanced diet Genetic & carrier screening discussed: requests Panorama and Horizon 14 , requests AFP Ultrasound discussed; fetal survey: requested [facility] completed> form faxed if has or is planning to apply for medicaid The nature of [facility] [employer] with multiple MDs and other Advanced Practice Providers was explained to patient; also emphasized that fellows, residents, and students are part of our team. Does not have  home bp cuff. Office bp cuff given: no; rx given for cuff. Check bp weekly, let us know if consistently >140/90.   No indications for ASA therapy or early HgbA1c (per uptodate)   Follow-up: Return in about 3 weeks (around 05/31/2021) for LROB; 3-4wks, in person.   Orders Placed This Encounter  Procedures  . Urine Culture  . GC/Chlamydia Probe Amp  . Genetic Screening  . Pain Management Screening Profile (10S)  . Hepatitis C antibody  . CBC/D/Plt+RPR+Rh+ABO+RubIgG...  . POC Urinalysis Dipstick OB    Arabella Merles [facility] 05/10/2021 4:32 PM

## 2021-05-11 LAB — CBC/D/PLT+RPR+RH+ABO+RUBIGG...
Antibody Screen: NEGATIVE
Basophils Absolute: 0.1 10*3/uL (ref 0.0–0.2)
Basos: 1 %
EOS (ABSOLUTE): 0.2 10*3/uL (ref 0.0–0.4)
Eos: 2 %
HCV Ab: <0.1 s/co ratio (ref 0.0–0.9)
HIV Screen 4th Generation wRfx: NONREACTIVE
Hematocrit: 39.4 % (ref 34.0–46.6)
Hemoglobin: 12.9 g/dL (ref 11.1–15.9)
Hepatitis B Surface Ag: NEGATIVE
Immature Grans (Abs): 0 10*3/uL (ref 0.0–0.1)
Immature Granulocytes: 0 %
Lymphocytes Absolute: 2.5 10*3/uL (ref 0.7–3.1)
Lymphs: 24 %
MCH: 28.5 pg (ref 26.6–33.0)
MCHC: 32.7 g/dL (ref 31.5–35.7)
MCV: 87 fL (ref 79–97)
Monocytes Absolute: 0.5 10*3/uL (ref 0.1–0.9)
Monocytes: 5 %
Neutrophils Absolute: 7 10*3/uL (ref 1.4–7.0)
Neutrophils: 68 %
Platelets: 294 10*3/uL (ref 150–450)
RBC: 4.53 x10E6/uL (ref 3.77–5.28)
RDW: 13.1 % (ref 11.7–15.4)
RPR Ser Ql: NONREACTIVE
Rh Factor: POSITIVE
Rubella Antibodies, IGG: 2.16 index (ref >0.99)
WBC: 10.2 10*3/uL (ref 3.4–10.8)

## 2021-05-11 LAB — PMP SCREEN PROFILE (10S), URINE
Amphetamine Scrn, Ur: NEGATIVE ng/mL
BARBITURATE SCREEN URINE: NEGATIVE ng/mL
BENZODIAZEPINE SCREEN, URINE: NEGATIVE ng/mL
CANNABINOIDS UR QL SCN: NEGATIVE ng/mL
Cocaine (Metab) Scrn, Ur: NEGATIVE ng/mL
Creatinine(Crt), U: 34.3 mg/dL (ref 20.0–300.0)
Methadone Screen, Urine: NEGATIVE ng/mL
OXYCODONE+OXYMORPHONE UR QL SCN: NEGATIVE ng/mL
Opiate Scrn, Ur: NEGATIVE ng/mL
Ph of Urine: 5.8 (ref 4.5–8.9)
Phencyclidine Qn, Ur: NEGATIVE ng/mL
Propoxyphene Scrn, Ur: NEGATIVE ng/mL

## 2021-05-11 LAB — HEPATITIS C ANTIBODY: Hep C Virus Ab: <0.1 s/co ratio (ref 0.0–0.9)

## 2021-05-11 LAB — HCV INTERPRETATION

## 2021-05-12 LAB — GC/CHLAMYDIA PROBE AMP
Chlamydia trachomatis, NAA: NEGATIVE
Neisseria Gonorrhoeae by PCR: NEGATIVE

## 2021-05-12 LAB — URINE CULTURE

## 2021-05-18 DIAGNOSIS — Z3401 Encounter for supervision of normal first pregnancy, first trimester: Secondary | ICD-10-CM | POA: Diagnosis not present

## 2021-05-23 ENCOUNTER — Encounter: Payer: Self-pay | Admitting: *Deleted

## 2021-05-23 ENCOUNTER — Telehealth: Payer: Self-pay | Admitting: Women's Health

## 2021-05-23 NOTE — Telephone Encounter (Signed)
Patient called stating that she forgot to take the information of the natera, so she could look up the gender of the baby. She would like to know if we could give her login info for natera so she could see it. Please contact pt

## 2021-05-24 DIAGNOSIS — Z419 Encounter for procedure for purposes other than remedying health state, unspecified: Secondary | ICD-10-CM | POA: Diagnosis not present

## 2021-06-07 ENCOUNTER — Other Ambulatory Visit (HOSPITAL_COMMUNITY)
Admission: RE | Admit: 2021-06-07 | Discharge: 2021-06-07 | Disposition: A | Discharge status: Home / Self Care | Payer: Medicaid Other | Source: Ambulatory Visit | Attending: Advanced Practice Midwife | Admitting: Advanced Practice Midwife

## 2021-06-07 ENCOUNTER — Other Ambulatory Visit: Payer: Self-pay

## 2021-06-07 ENCOUNTER — Ambulatory Visit (INDEPENDENT_AMBULATORY_CARE_PROVIDER_SITE_OTHER): Payer: Medicaid Other | Admitting: Advanced Practice Midwife

## 2021-06-07 VITALS — BP 108/68 | HR 88 | Wt 165.6 lb

## 2021-06-07 DIAGNOSIS — Z01419 Encounter for gynecological examination (general) (routine) without abnormal findings: Secondary | ICD-10-CM | POA: Insufficient documentation

## 2021-06-07 DIAGNOSIS — Z363 Encounter for antenatal screening for malformations: Secondary | ICD-10-CM

## 2021-06-07 DIAGNOSIS — Z3402 Encounter for supervision of normal first pregnancy, second trimester: Secondary | ICD-10-CM

## 2021-06-07 DIAGNOSIS — Z1379 Encounter for other screening for genetic and chromosomal anomalies: Secondary | ICD-10-CM

## 2021-06-07 DIAGNOSIS — Z3A17 17 weeks gestation of pregnancy: Secondary | ICD-10-CM

## 2021-06-07 NOTE — Progress Notes (Signed)
   LOW-RISK PREGNANCY VISIT Patient name: Catherine Park MRN [id number]  Date of birth: [birth year] Chief Complaint:   Routine Prenatal Visit  History of Present Illness:   Catherine Park is a 25 y.o. G45P0000 female at [redacted]w[redacted]d with an Estimated Date of Delivery: 11/13/21 being seen today for ongoing management of a low-risk pregnancy.  Today she reports  low back and R leg discomfort . Contractions: Not present. Vag. Bleeding: None.  Movement: Absent. denies leaking of fluid. Review of Systems:   Pertinent items are noted in HPI Denies abnormal vaginal discharge w/ itching/odor/irritation, headaches, visual changes, shortness of breath, chest pain, abdominal pain, severe nausea/vomiting, or problems with urination or bowel movements unless otherwise stated above. Pertinent History Reviewed:  Reviewed past medical,surgical, social, obstetrical and family history.  Reviewed problem list, medications and allergies. Physical Assessment:   Vitals:   06/07/21 1620  BP: 108/68  Pulse: 88  Weight: 165 lb 9.6 oz (75.1 kg)  Body mass index is 30.29 kg/m.        Physical Examination:   General appearance: Well appearing, and in no distress  Mental status: Alert, oriented to person, place, and time  Skin: Warm & dry  Cardiovascular: Normal heart rate noted  Respiratory: Normal respiratory effort, no distress  Abdomen: Soft, gravid, nontender  Pelvic: Cervical exam deferred ; Thin prep collected        Extremities: Edema: None  Fetal Status: Fetal Heart Rate (bpm): 146   Movement: Absent    No results found for this or any previous visit (from the past 24 hour(s)).  Assessment & Plan:  1) Low-risk pregnancy G1P0000 at [redacted]w[redacted]d with an Estimated Date of Delivery: 11/13/21   2) Low back/R leg discomfort, tips given; may take intermittent ibuprofen until 28wks   Meds: No orders of the defined types were placed in this encounter.  Labs/procedures today: Pap & AFP  Plan:   Continue routine obstetrical care   Reviewed: Preterm labor symptoms and general obstetric precautions including but not limited to vaginal bleeding, contractions, leaking of fluid and fetal movement were reviewed in detail with the patient.  All questions were answered. Didn't ask about home bp cuff. Check bp weekly, let us know if >140/90.   Follow-up: Return for 2-3 wks LROB, anatomy u/s.  Orders Placed This Encounter  Procedures   US OB Comp + 14 Wk   AFP, Serum, Open Spina Bifida   Arabella Merles [facility] 06/07/2021 4:49 PM

## 2021-06-07 NOTE — Patient Instructions (Signed)
Catherine Park, thank you for choosing our office today! We appreciate the opportunity to meet your healthcare needs. You may receive a short survey by mail, e-mail, or through MyChart. If you are happy with your care we would appreciate if you could take just a few minutes to complete the survey questions. We read all of your comments and take your feedback very seriously. Thank you again for choosing our office.  [facility] Team at [facility] ([address] [place], [place] [address]) Entrance C, located off of [address] Free 24/7 valet parking  Go to [email or web address] to register for FREE online childbirth classes  Call the office ([phone]) or go to [facility] if: You begin to severe cramping Your water breaks.  Sometimes it is a big gush of fluid, sometimes it is just a trickle that keeps getting your panties wet or running down your legs You have vaginal bleeding.  It is normal to have a small amount of spotting if your cervix was checked.   [place] Pediatricians/Family Doctors [facility] ([facility]): [address], [phone]           [facility]: [address], [phone]                [facility] ([facility]): [address], [phone] (call to ask if accepting patients) [facility]: [address], [place], [phone]    [place] Pediatricians/Family Doctors [facility] ([facility]): [address], [phone] [facility]: [address], [phone] [facility]: [address], [phone]  [place] [facility]  [facility] ([facility]): [phone] [facility]: [address], [phone]   [place] [facility]: [address], [phone]  [place] [facility]  [facility]: [address], [phone]  Home Blood Pressure Monitoring for Patients   Your provider has recommended that you check your blood pressure (BP) at least once a week at home. If you do not have a blood pressure cuff at home, one will be provided for you. Contact your provider if you have not received your monitor within 1 week.   Helpful Tips for Accurate Home Blood Pressure Checks  Don't smoke, exercise, or drink caffeine 30 minutes before checking your BP Use the restroom before checking your BP (a full bladder can raise your pressure) Relax in a comfortable upright chair Feet on the ground Left arm resting comfortably on a flat surface at the level of your heart Legs uncrossed Back supported Sit quietly and don't talk Place the cuff on your bare arm Adjust snuggly, so that only two fingertips can fit between your skin and the top of the cuff Check 2 readings separated by at least one minute Keep a log of your BP readings For a visual, please reference this diagram: http://ccnc.care/bpdiagram  Provider Name: [facility]     Phone: [phone]  Zone 1: ALL CLEAR  Continue to monitor your symptoms:  BP reading is less than 140 (top number) or less than 90 (bottom number)  No right upper stomach pain No headaches or seeing spots No feeling nauseated or throwing up No swelling in face and hands  Zone 2: CAUTION Call your doctor's office for any of the following:  BP reading is greater than 140 (top number) or greater than   90 (bottom number)  Stomach pain under your ribs in the middle or right side Headaches or seeing spots Feeling nauseated or throwing up Swelling in face and hands  Zone 3: EMERGENCY  Seek immediate medical care if you have any of the following:  BP reading is greater than160 (top number) or greater than 110 (bottom number) Severe headaches not improving with Tylenol Serious difficulty catching your breath Any worsening symptoms from  Zone 2     Second Trimester of Pregnancy The second trimester is from week 14 through week 27 (months 4 through 6). The second trimester is often a time when you feel your best. Your body has adjusted to being pregnant, and you begin to feel better physically. Usually, morning sickness has lessened or quit completely, you may have more energy, and you may have an increase in appetite. The second trimester is also a time when the fetus is growing rapidly. At the end of the sixth month, the fetus is about 9 inches long and weighs about 1 pounds. You will likely begin to feel the baby move (quickening) between 16 and 20 weeks of pregnancy. Body changes during your second trimester Your body continues to go through many changes during your second trimester. The changes vary from woman to woman. Your weight will continue to increase. You will notice your lower abdomen bulging out. You may begin to get stretch marks on your hips, abdomen, and breasts. You may develop headaches that can be relieved by medicines. The medicines should be approved by your health care provider. You may urinate more often because the fetus is pressing on your bladder. You may develop or continue to have heartburn as a result of your pregnancy. You may develop constipation because certain hormones are causing the muscles that push waste through your intestines to slow down. You may develop hemorrhoids or swollen, bulging veins (varicose veins). You may have back pain. This is caused by: Weight gain. Pregnancy hormones that are relaxing the joints in your pelvis. A shift in weight and the muscles that support your balance. Your breasts will continue to grow and they will continue to become tender. Your gums may bleed and may be sensitive to brushing and flossing. Dark spots or blotches (chloasma, mask of pregnancy) may develop on your face. This will likely fade after the baby is born. A dark line from your belly button to  the pubic area (linea nigra) may appear. This will likely fade after the baby is born. You may have changes in your hair. These can include thickening of your hair, rapid growth, and changes in texture. Some women also have hair loss during or after pregnancy, or hair that feels dry or thin. Your hair will most likely return to normal after your baby is born.  What to expect at prenatal visits During a routine prenatal visit: You will be weighed to make sure you and the fetus are growing normally. Your blood pressure will be taken. Your abdomen will be measured to track your baby's growth. The fetal heartbeat will be listened to. Any test results from the previous visit will be discussed.  Your health care provider may ask you: How you are feeling. If you are feeling the baby move. If you have had any abnormal symptoms, such as leaking fluid, bleeding, severe headaches, or abdominal cramping. If you are using any tobacco products, including cigarettes, chewing tobacco, and electronic cigarettes. If you have any questions.  Other tests that may be performed during   your second trimester include: Blood tests that check for: Low iron levels (anemia). High blood sugar that affects pregnant women (gestational diabetes) between 24 and 28 weeks. Rh antibodies. This is to check for a protein on red blood cells (Rh factor). Urine tests to check for infections, diabetes, or protein in the urine. An ultrasound to confirm the proper growth and development of the baby. An amniocentesis to check for possible genetic problems. Fetal screens for spina bifida and Down syndrome. HIV (human immunodeficiency virus) testing. Routine prenatal testing includes screening for HIV, unless you choose not to have this test.  Follow these instructions at home: Medicines Follow your health care provider's instructions regarding medicine use. Specific medicines may be either safe or unsafe to take during  pregnancy. Take a prenatal vitamin that contains at least 600 micrograms (mcg) of folic acid. If you develop constipation, try taking a stool softener if your health care provider approves. Eating and drinking Eat a balanced diet that includes fresh fruits and vegetables, whole grains, good sources of protein such as meat, eggs, or tofu, and low-fat dairy. Your health care provider will help you determine the amount of weight gain that is right for you. Avoid raw meat and uncooked cheese. These carry germs that can cause birth defects in the baby. If you have low calcium intake from food, talk to your health care provider about whether you should take a daily calcium supplement. Limit foods that are high in fat and processed sugars, such as fried and sweet foods. To prevent constipation: Drink enough fluid to keep your urine clear or pale yellow. Eat foods that are high in fiber, such as fresh fruits and vegetables, whole grains, and beans. Activity Exercise only as directed by your health care provider. Most women can continue their usual exercise routine during pregnancy. Try to exercise for 30 minutes at least 5 days a week. Stop exercising if you experience uterine contractions. Avoid heavy lifting, wear low heel shoes, and practice good posture. A sexual relationship may be continued unless your health care provider directs you otherwise. Relieving pain and discomfort Wear a good support bra to prevent discomfort from breast tenderness. Take warm sitz baths to soothe any pain or discomfort caused by hemorrhoids. Use hemorrhoid cream if your health care provider approves. Rest with your legs elevated if you have leg cramps or low back pain. If you develop varicose veins, wear support hose. Elevate your feet for 15 minutes, 3-4 times a day. Limit salt in your diet. Prenatal Care Write down your questions. Take them to your prenatal visits. Keep all your prenatal visits as told by your health  care provider. This is important. Safety Wear your seat belt at all times when driving. Make a list of emergency phone numbers, including numbers for family, friends, the hospital, and police and fire departments. General instructions Ask your health care provider for a referral to a local prenatal education class. Begin classes no later than the beginning of month 6 of your pregnancy. Ask for help if you have counseling or nutritional needs during pregnancy. Your health care provider can offer advice or refer you to specialists for help with various needs. Do not use hot tubs, steam rooms, or saunas. Do not douche or use tampons or scented sanitary pads. Do not cross your legs for long periods of time. Avoid cat litter boxes and soil used by cats. These carry germs that can cause birth defects in the baby and possibly loss of the   fetus by miscarriage or stillbirth. Avoid all smoking, herbs, alcohol, and unprescribed drugs. Chemicals in these products can affect the formation and growth of the baby. Do not use any products that contain nicotine or tobacco, such as cigarettes and e-cigarettes. If you need help quitting, ask your health care provider. Visit your dentist if you have not gone yet during your pregnancy. Use a soft toothbrush to brush your teeth and be gentle when you floss. Contact a health care provider if: You have dizziness. You have mild pelvic cramps, pelvic pressure, or nagging pain in the abdominal area. You have persistent nausea, vomiting, or diarrhea. You have a bad smelling vaginal discharge. You have pain when you urinate. Get help right away if: You have a fever. You are leaking fluid from your vagina. You have spotting or bleeding from your vagina. You have severe abdominal cramping or pain. You have rapid weight gain or weight loss. You have shortness of breath with chest pain. You notice sudden or extreme swelling of your face, hands, ankles, feet, or legs. You  have not felt your baby move in over an hour. You have severe headaches that do not go away when you take medicine. You have vision changes. Summary The second trimester is from week 14 through week 27 (months 4 through 6). It is also a time when the fetus is growing rapidly. Your body goes through many changes during pregnancy. The changes vary from woman to woman. Avoid all smoking, herbs, alcohol, and unprescribed drugs. These chemicals affect the formation and growth your baby. Do not use any tobacco products, such as cigarettes, chewing tobacco, and e-cigarettes. If you need help quitting, ask your health care provider. Contact your health care provider if you have any questions. Keep all prenatal visits as told by your health care provider. This is important. This information is not intended to replace advice given to you by your health care provider. Make sure you discuss any questions you have with your health care provider. Document Released: 12/04/2001 Document Revised: 05/17/2016 Document Reviewed: 02/10/2013 Elsevier Interactive Patient Education  2017 Elsevier Inc.  

## 2021-06-09 LAB — CYTOLOGY - PAP: Diagnosis: NEGATIVE

## 2021-06-15 LAB — AFP, SERUM, OPEN SPINA BIFIDA
AFP MoM: 0.77
AFP Value: 29.5 ng/mL
Gest. Age on Collection Date: 17.2 weeks
Maternal Age At EDD: 25.2 yr
OSBR Risk 1 IN: 10000
Test Results:: NEGATIVE
Weight: 165 [lb_av]

## 2021-06-16 ENCOUNTER — Encounter (HOSPITAL_COMMUNITY): Payer: Self-pay | Admitting: Emergency Medicine

## 2021-06-16 ENCOUNTER — Emergency Department (HOSPITAL_COMMUNITY): Payer: Medicaid Other

## 2021-06-16 ENCOUNTER — Other Ambulatory Visit: Payer: Self-pay

## 2021-06-16 ENCOUNTER — Emergency Department (HOSPITAL_COMMUNITY)
Admission: EM | Admit: 2021-06-16 | Discharge: 2021-06-16 | Disposition: A | Discharge status: Home / Self Care | Payer: Medicaid Other | Attending: Emergency Medicine | Admitting: Emergency Medicine

## 2021-06-16 DIAGNOSIS — O219 Vomiting of pregnancy, unspecified: Secondary | ICD-10-CM | POA: Diagnosis not present

## 2021-06-16 DIAGNOSIS — O26892 Other specified pregnancy related conditions, second trimester: Secondary | ICD-10-CM | POA: Insufficient documentation

## 2021-06-16 DIAGNOSIS — Z3A18 18 weeks gestation of pregnancy: Secondary | ICD-10-CM | POA: Diagnosis not present

## 2021-06-16 DIAGNOSIS — R1031 Right lower quadrant pain: Secondary | ICD-10-CM | POA: Diagnosis not present

## 2021-06-16 DIAGNOSIS — R188 Other ascites: Secondary | ICD-10-CM | POA: Diagnosis not present

## 2021-06-16 DIAGNOSIS — O210 Mild hyperemesis gravidarum: Secondary | ICD-10-CM | POA: Diagnosis not present

## 2021-06-16 DIAGNOSIS — R1033 Periumbilical pain: Secondary | ICD-10-CM | POA: Diagnosis not present

## 2021-06-16 DIAGNOSIS — E785 Hyperlipidemia, unspecified: Secondary | ICD-10-CM | POA: Insufficient documentation

## 2021-06-16 DIAGNOSIS — R112 Nausea with vomiting, unspecified: Secondary | ICD-10-CM

## 2021-06-16 DIAGNOSIS — O99282 Endocrine, nutritional and metabolic diseases complicating pregnancy, second trimester: Secondary | ICD-10-CM | POA: Insufficient documentation

## 2021-06-16 DIAGNOSIS — R110 Nausea: Secondary | ICD-10-CM | POA: Diagnosis not present

## 2021-06-16 DIAGNOSIS — R109 Unspecified abdominal pain: Secondary | ICD-10-CM

## 2021-06-16 LAB — URINALYSIS, ROUTINE W REFLEX MICROSCOPIC
Bilirubin Urine: NEGATIVE
Glucose, UA: NEGATIVE mg/dL
Hgb urine dipstick: NEGATIVE
Ketones, ur: 80 mg/dL — AB
Nitrite: NEGATIVE
Protein, ur: 30 mg/dL — AB
Specific Gravity, Urine: 1.027 (ref 1.005–1.030)
pH: 7 (ref 5.0–8.0)

## 2021-06-16 LAB — BASIC METABOLIC PANEL
Anion gap: 8 (ref 5–15)
BUN: 6 mg/dL (ref 6–20)
CO2: 23 mmol/L (ref 22–32)
Calcium: 9 mg/dL (ref 8.9–10.3)
Chloride: 104 mmol/L (ref 98–111)
Creatinine, Ser: 0.39 mg/dL — ABNORMAL LOW (ref 0.44–1.00)
GFR, Estimated: >60 mL/min (ref >60)
Glucose, Bld: 115 mg/dL — ABNORMAL HIGH (ref 70–99)
Potassium: 3.5 mmol/L (ref 3.5–5.1)
Sodium: 135 mmol/L (ref 135–145)

## 2021-06-16 LAB — CBC
HCT: 38.4 % (ref 36.0–46.0)
Hemoglobin: 13.1 g/dL (ref 12.0–15.0)
MCH: 29.8 pg (ref 26.0–34.0)
MCHC: 34.1 g/dL (ref 30.0–36.0)
MCV: 87.3 fL (ref 80.0–100.0)
Platelets: 271 10*3/uL (ref 150–400)
RBC: 4.4 MIL/uL (ref 3.87–5.11)
RDW: 13.2 % (ref 11.5–15.5)
WBC: 15.6 10*3/uL — ABNORMAL HIGH (ref 4.0–10.5)
nRBC: 0 % (ref 0.0–0.2)

## 2021-06-16 MED ORDER — DEXTROSE 5 % IN LACTATED RINGERS IV BOLUS
1000.0000 mL | Freq: Once | INTRAVENOUS | Status: AC
  Administered 2021-06-16: 1000 mL via INTRAVENOUS

## 2021-06-16 MED ORDER — ONDANSETRON HCL 4 MG/2ML IJ SOLN
4.0000 mg | Freq: Once | INTRAMUSCULAR | Status: AC
  Administered 2021-06-16: 4 mg via INTRAVENOUS
  Filled 2021-06-16: qty 2

## 2021-06-16 NOTE — ED Triage Notes (Signed)
Pt c/o N/V since about 2030 last night. Pt is currently [redacted] weeks pregnant.

## 2021-06-16 NOTE — ED Notes (Signed)
Pt transported to MRI 

## 2021-06-16 NOTE — ED Provider Notes (Signed)
[facility] EMERGENCY DEPARTMENT Provider Note   CSN: [id number] Arrival date & time: 06/16/21  0319     History Chief Complaint  Patient presents with   Emesis During Pregnancy    Catherine Park is a 25 y.o. female.  The history is provided by the patient.  She has history of hyperlipidemia and is currently [redacted] weeks pregnant and comes in because of right-sided abdominal pain and nausea and vomiting which started about 8 PM.  She has vomited several times.  Pain is located in the right mid abdomen.  There is no radiation of pain.  She denies fever, chills, sweats.  Pain is rated at 6/10.  Pregnancy has been uncomplicated to this point.   Past Medical History:  Diagnosis Date   Hyperlipidemia     Patient Active Problem List   Diagnosis Date Noted   Supervision of normal first pregnancy 05/09/2021    Past Surgical History:  Procedure Laterality Date   WISDOM TOOTH EXTRACTION       OB History     Gravida  1   Para  0   Term  0   Preterm  0   AB  0   Living  0      SAB  0   IAB  0   Ectopic  0   Multiple  0   Live Births  0           Family History  Problem Relation Age of Onset   Hyperlipidemia Mother     Social History   Tobacco Use   Smoking status: Never   Smokeless tobacco: Never  Vaping Use   Vaping Use: Never used  Substance Use Topics   Alcohol use: No   Drug use: No    Home Medications Prior to Admission medications   Medication Sig Start Date End Date Taking? Authorizing Provider  Blood Pressure Monitor MISC For regular home bp monitoring during pregnancy 05/10/21   Arabella Merles, CNM  Prenatal Vit-Fe Fumarate-FA (PRENATAL VITAMIN PO) Take by mouth. Patient not taking: No sig reported    [provider]    Allergies    Patient has no known allergies.  Review of Systems   Review of Systems  All other systems reviewed and are negative.  Physical Exam Updated Vital Signs BP 111/76 (BP Location:  Left Arm)   Pulse 80   Temp 98 F (36.7 C) (Oral)   Resp 17   Ht 5\' 2"  (1.575 m)   Wt 75.8 kg   LMP 01/30/2021 (Exact Date)   SpO2 99%   BMI 30.54 kg/m   Physical Exam Vitals and nursing note reviewed.  25 year old female, resting comfortably and in no acute distress. Vital signs are normal. Oxygen saturation is 99%, which is normal. Head is normocephalic and atraumatic. PERRLA, EOMI. Oropharynx is clear. Neck is nontender and supple without adenopathy or JVD. Back is nontender and there is no CVA tenderness. Lungs are clear without rales, wheezes, or rhonchi. Chest is nontender. Heart has regular rate and rhythm without murmur. Abdomen is soft, flat, with moderate tenderness in the right mid abdomen.  There is no rebound or guarding.  There are no masses or hepatosplenomegaly and peristalsis is hypoactive.  Fundus is enlarged to just below the umbilicus, consistent with dates.  Fundus is nontender. Extremities have no cyanosis or edema, full range of motion is present. Skin is warm and dry without rash. Neurologic: Mental status is normal, cranial nerves  are intact, there are no motor or sensory deficits.  ED Results / Procedures / Treatments   Labs (all labs ordered are listed, but only abnormal results are displayed) Labs Reviewed  CBC - Abnormal; Notable for the following components:      Result Value   WBC 15.6 (*)    All other components within normal limits  BASIC METABOLIC PANEL - Abnormal; Notable for the following components:   Glucose, Bld 115 (*)    Creatinine, Ser 0.39 (*)    All other components within normal limits  URINALYSIS, ROUTINE W REFLEX MICROSCOPIC - Abnormal; Notable for the following components:   APPearance HAZY (*)    Ketones, ur 80 (*)    Protein, ur 30 (*)    Leukocytes,Ua TRACE (*)    Bacteria, UA FEW (*)    All other components within normal limits   Radiology No results found.  Procedures Procedures   Medications Ordered in  ED Medications  dextrose 5% lactated ringers bolus 1,000 mL (1,000 mLs Intravenous New Bag/Given 06/16/21 0627)  ondansetron (ZOFRAN) injection 4 mg (4 mg Intravenous Given 06/16/21 8469)    ED Course  I have reviewed the triage vital signs and the nursing notes.  Pertinent labs & imaging results that were available during my care of the patient were reviewed by me and considered in my medical decision making (see chart for details).   MDM Rules/Calculators/A&P                         Right-sided abdominal pain in the second trimester pregnancy.  Abdomen is only mildly to moderately tender, but I am concerned about possible intra-abdominal pathology such as cholecystitis, appendicitis.  Will check screening labs and give IV fluids and ondansetron.  Old records are reviewed, confirming outpatient management of first pregnancy with estimated date of delivery 11/13/2021.  Labs show mild leukocytosis which is not unusual for the stage of pregnancy.  Urinalysis does have significant ketonuria.  Case is signed out to Dr. Bernette Mayers.  Final Clinical Impression(s) / ED Diagnoses Final diagnoses:  Abdominal pain during pregnancy in second trimester  Non-intractable vomiting with nausea, unspecified vomiting type    Rx / DC Orders ED Discharge Orders     None        Dione Booze, MD 06/16/21 0740

## 2021-06-16 NOTE — ED Provider Notes (Signed)
Care of the patient assumed at the change of shift. She is ~[redacted]wks pregnant with uncomplicated course so far. Here with R sided abdominal pain, vomiting.  Physical Exam  BP 113/70   Pulse 74   Temp 98 F (36.7 C) (Oral)   Resp 18   Ht 5\' 2"  (1.575 m)   Wt 75.8 kg   LMP 01/30/2021 (Exact Date)   SpO2 100%   BMI 30.54 kg/m   Physical Exam Tenderness to palpation RLQ, no  guarding, gravid uterus below the umbilicus ED Course/Procedures     Procedures  MDM  Given continued RLQ tenderness, will send for MRI to rule out appendicitis.   10:20 AM Appendix not definitely seen on MRI but no other sequela of acute appendicitis. Will d/c home with Ob follow up.        03/30/2021, MD 06/16/21 [phone]

## 2021-06-19 ENCOUNTER — Ambulatory Visit (INDEPENDENT_AMBULATORY_CARE_PROVIDER_SITE_OTHER): Payer: Medicaid Other | Admitting: Women's Health

## 2021-06-19 ENCOUNTER — Other Ambulatory Visit: Payer: Self-pay

## 2021-06-19 ENCOUNTER — Encounter: Payer: Self-pay | Admitting: Women's Health

## 2021-06-19 VITALS — BP 117/79 | HR 83 | Wt 168.0 lb

## 2021-06-19 DIAGNOSIS — R1011 Right upper quadrant pain: Secondary | ICD-10-CM

## 2021-06-19 DIAGNOSIS — Z3402 Encounter for supervision of normal first pregnancy, second trimester: Secondary | ICD-10-CM

## 2021-06-19 DIAGNOSIS — R1013 Epigastric pain: Secondary | ICD-10-CM

## 2021-06-19 NOTE — Progress Notes (Signed)
Work-in LOW-RISK PREGNANCY VISIT Patient name: Catherine Park MRN [id number]  Date of birth: 1996-11-28 Chief Complaint:   Pain  History of Present Illness:   Christean Silvestri is a 25 y.o. G29P0000 female at [redacted]w[redacted]d with an Estimated Date of Delivery: 11/13/21 being seen today for ongoing management of a low-risk pregnancy.   Today she is being seen as a work-in for report of epigastric pain radiating to RUQ that started last Thursday night. Tried to apply heat, which had helped in past when she had this pain (in April), had n/v x1. Ate noodles, then vomited few other times. Went to ED, had neg labs/MRI. Denies heartburn/reflux. Pain does not radiate to right upper back. Pain and n/v have both improved since Thursday. Ate hamburger on Sat w/o any problems.  Contractions: Not present. Vag. Bleeding: None.  Movement: Present. denies leaking of fluid.  Depression screen [facility] 2/9 05/10/2021 10/07/2019  Decreased Interest 0 0  Down, Depressed, Hopeless 0 0  PHQ - 2 Score 0 0  Altered sleeping 1 -  Tired, decreased energy 0 -  Change in appetite 1 -  Feeling bad or failure about yourself  0 -  Trouble concentrating 1 -  Moving slowly or fidgety/restless 0 -  Suicidal thoughts 0 -  PHQ-9 Score 3 -     GAD 7 : Generalized Anxiety Score 05/10/2021  Nervous, Anxious, on Edge 1  Control/stop worrying 1  Worry too much - different things 1  Trouble relaxing 0  Restless 1  Easily annoyed or irritable 1  Afraid - awful might happen 0  Total GAD 7 Score 5      Review of Systems:   Pertinent items are noted in HPI Denies abnormal vaginal discharge w/ itching/odor/irritation, headaches, visual changes, shortness of breath, chest pain, abdominal pain, severe nausea/vomiting, or problems with urination or bowel movements unless otherwise stated above. Pertinent History Reviewed:  Reviewed past medical,surgical, social, obstetrical and family history.  Reviewed problem list,  medications and allergies. Physical Assessment:   Vitals:   06/19/21 1005  BP: 117/79  Pulse: 83  Weight: 168 lb (76.2 kg)  Body mass index is 30.73 kg/m.        Physical Examination:   General appearance: Well appearing, and in no distress  Mental status: Alert, oriented to person, place, and time  Skin: Warm & dry  Cardiovascular: Normal heart rate noted  Respiratory: Normal respiratory effort, no distress  Abdomen: Soft, gravid, no epigastric pain, mild RUQ pain  Pelvic: Cervical exam deferred         Extremities: Edema: Trace  Fetal Status: Fetal Heart Rate (bpm): 140   Movement: Present    Chaperone: N/A   No results found for this or any previous visit (from the past 24 hour(s)).  Assessment & Plan:  1) Low-risk pregnancy G1P0000 at [redacted]w[redacted]d with an Estimated Date of Delivery: 11/13/21   2) Epigastric/RUQ pain w/ n/v, on Thursday, improving now. Normal MRI/labs in ED. Mild RUQ tenderness on exam. Gallbladder vs reflux discussed. If comes back, try TUMS or otc antacid, if worsening/not improving let us know and will get GB u/s.    Meds: No orders of the defined types were placed in this encounter.  Labs/procedures today: none  Plan:  Continue routine obstetrical care  Next visit: prefers will be in person for u/s     Reviewed: Preterm labor symptoms and general obstetric precautions including but not limited to vaginal bleeding, contractions, leaking of fluid and fetal  movement were reviewed in detail with the patient.  All questions were answered.   Follow-up: Return for As scheduled.  Future Appointments  Date Time Provider Department Center  06/29/2021  3:15 PM [facility] [place] [facility] None  06/29/2021  4:10 PM Cheral Marker, CNM [facility]    No orders of the defined types were placed in this encounter.  Cheral Marker CNM, [facility] 06/19/2021 11:18 AM

## 2021-06-19 NOTE — Patient Instructions (Signed)
[place], thank you for choosing our office today! We appreciate the opportunity to meet your healthcare needs. You may receive a short survey by mail, e-mail, or through [employer]. If you are happy with your care we would appreciate if you could take just a few minutes to complete the survey questions. We read all of your comments and take your feedback very seriously. Thank you again for choosing our office.  Center for [employer] Team at [facility] at [facility] ([address] [place], [place] [address]) Entrance C, located off of [address] Free 24/7 valet parking  Go to [email or web address] to register for FREE online childbirth classes  Call the office [phone]) or go to [facility] if: You begin to severe cramping Your water breaks.  Sometimes it is a big gush of fluid, sometimes it is just a trickle that keeps getting your panties wet or running down your legs You have vaginal bleeding.  It is normal to have a small amount of spotting if your cervix was checked.   [facility] Pediatricians/Family Doctors [facility]): [address] Colette Ribas, [phone]           [facility]: [address], [phone]                [facility]): [address], [phone] (call to ask if accepting patients) [place] Department: [address], [place], [phone]    [facility] Pediatricians/Family Doctors [facility]): [phone] [address], [phone] [facility]: [address] [place], [phone] [facility]: [address], [phone]  [facility] Doctors  [facility]): [phone] [facility]: [address], [phone]   [facility] Doctors [facility]: [address] [phone]  [facility] Doctors  [facility]: [phone], [phone]  Home Blood Pressure Monitoring for Patients   Your provider has recommended that you check your blood pressure (BP) at least once a week at home. If you do not have a blood pressure cuff at home, one will be provided for you. Contact your provider if you have not received your monitor within 1 week.   Helpful Tips for Accurate Home Blood Pressure Checks  Don't smoke, exercise, or drink caffeine 30 minutes before checking your BP Use the restroom before checking your BP (a full bladder can raise your pressure) Relax in a comfortable upright chair Feet on the ground Left arm resting comfortably on a flat surface at the level of your heart Legs uncrossed Back supported Sit quietly and don't talk Place the cuff on your bare arm Adjust snuggly, so that only two fingertips can fit between your skin and the top of the cuff Check 2 readings separated by at least one minute Keep a log of your BP readings For a visual, please reference this diagram: http://ccnc.care/bpdiagram  Provider Name: [facility]     Phone: [phone]  Zone 1: ALL CLEAR  Continue to monitor your symptoms:  BP reading is less than 140 (top number) or less than 90 (bottom number)  No right upper stomach pain No headaches or seeing spots No feeling nauseated or throwing up No swelling in face and hands  Zone 2: CAUTION Call your doctor's office for any of the following:  BP reading is greater than 140 (top number) or greater than  90 (bottom number)  Stomach pain under your ribs in the middle or right side Headaches or seeing spots Feeling nauseated or throwing up Swelling in face and hands  Zone 3: EMERGENCY  Seek immediate medical care if you have any of the following:  BP reading is greater than160 (top number) or greater than 110 (bottom number) Severe headaches not improving with Tylenol Serious difficulty catching your breath Any worsening symptoms from  Zone 2     Second Trimester of Pregnancy The second trimester is from week 14 through week 27 (months 4 through 6). The second trimester is often a time when you feel your best. Your body has adjusted to being pregnant, and you begin to feel better physically. Usually, morning sickness has lessened or quit completely, you may have more energy, and you may have an increase in appetite. The second trimester is also a time when the fetus is growing rapidly. At the end of the sixth month, the fetus is about 9 inches long and weighs about 1 pounds. You will likely begin to feel the baby move (quickening) between 16 and 20 weeks of pregnancy. Body changes during your second trimester Your body continues to go through many changes during your second trimester. The changes vary from woman to woman. Your weight will continue to increase. You will notice your lower abdomen bulging out. You may begin to get stretch marks on your hips, abdomen, and breasts. You may develop headaches that can be relieved by medicines. The medicines should be approved by your health care provider. You may urinate more often because the fetus is pressing on your bladder. You may develop or continue to have heartburn as a result of your pregnancy. You may develop constipation because certain hormones are causing the muscles that push waste through your intestines to slow down. You may develop hemorrhoids or swollen, bulging veins (varicose veins). You may have back pain. This is caused by: Weight gain. Pregnancy hormones that are relaxing the joints in your pelvis. A shift in weight and the muscles that support your balance. Your breasts will continue to grow and they will continue to become tender. Your gums may bleed and may be sensitive to brushing and flossing. Dark spots or blotches (chloasma, mask of pregnancy) may develop on your face. This will likely fade after the baby is born. A dark line from your belly button to  the pubic area (linea nigra) may appear. This will likely fade after the baby is born. You may have changes in your hair. These can include thickening of your hair, rapid growth, and changes in texture. Some women also have hair loss during or after pregnancy, or hair that feels dry or thin. Your hair will most likely return to normal after your baby is born.  What to expect at prenatal visits During a routine prenatal visit: You will be weighed to make sure you and the fetus are growing normally. Your blood pressure will be taken. Your abdomen will be measured to track your baby's growth. The fetal heartbeat will be listened to. Any test results from the previous visit will be discussed.  Your health care provider may ask you: How you are feeling. If you are feeling the baby move. If you have had any abnormal symptoms, such as leaking fluid, bleeding, severe headaches, or abdominal cramping. If you are using any tobacco products, including cigarettes, chewing tobacco, and electronic cigarettes. If you have any questions.  Other tests that may be performed during   your second trimester include: Blood tests that check for: Low iron levels (anemia). High blood sugar that affects pregnant women (gestational diabetes) between 24 and 28 weeks. Rh antibodies. This is to check for a protein on red blood cells (Rh factor). Urine tests to check for infections, diabetes, or protein in the urine. An ultrasound to confirm the proper growth and development of the baby. An amniocentesis to check for possible genetic problems. Fetal screens for spina bifida and Down syndrome. HIV (human immunodeficiency virus) testing. Routine prenatal testing includes screening for HIV, unless you choose not to have this test.  Follow these instructions at home: Medicines Follow your health care provider's instructions regarding medicine use. Specific medicines may be either safe or unsafe to take during  pregnancy. Take a prenatal vitamin that contains at least 600 micrograms (mcg) of folic acid. If you develop constipation, try taking a stool softener if your health care provider approves. Eating and drinking Eat a balanced diet that includes fresh fruits and vegetables, whole grains, good sources of protein such as meat, eggs, or tofu, and low-fat dairy. Your health care provider will help you determine the amount of weight gain that is right for you. Avoid raw meat and uncooked cheese. These carry germs that can cause birth defects in the baby. If you have low calcium intake from food, talk to your health care provider about whether you should take a daily calcium supplement. Limit foods that are high in fat and processed sugars, such as fried and sweet foods. To prevent constipation: Drink enough fluid to keep your urine clear or pale yellow. Eat foods that are high in fiber, such as fresh fruits and vegetables, whole grains, and beans. Activity Exercise only as directed by your health care provider. Most women can continue their usual exercise routine during pregnancy. Try to exercise for 30 minutes at least 5 days a week. Stop exercising if you experience uterine contractions. Avoid heavy lifting, wear low heel shoes, and practice good posture. A sexual relationship may be continued unless your health care provider directs you otherwise. Relieving pain and discomfort Wear a good support bra to prevent discomfort from breast tenderness. Take warm sitz baths to soothe any pain or discomfort caused by hemorrhoids. Use hemorrhoid cream if your health care provider approves. Rest with your legs elevated if you have leg cramps or low back pain. If you develop varicose veins, wear support hose. Elevate your feet for 15 minutes, 3-4 times a day. Limit salt in your diet. Prenatal Care Write down your questions. Take them to your prenatal visits. Keep all your prenatal visits as told by your health  care provider. This is important. Safety Wear your seat belt at all times when driving. Make a list of emergency phone numbers, including numbers for family, friends, the hospital, and police and fire departments. General instructions Ask your health care provider for a referral to a local prenatal education class. Begin classes no later than the beginning of month 6 of your pregnancy. Ask for help if you have counseling or nutritional needs during pregnancy. Your health care provider can offer advice or refer you to specialists for help with various needs. Do not use hot tubs, steam rooms, or saunas. Do not douche or use tampons or scented sanitary pads. Do not cross your legs for long periods of time. Avoid cat litter boxes and soil used by cats. These carry germs that can cause birth defects in the baby and possibly loss of the   fetus by miscarriage or stillbirth. Avoid all smoking, herbs, alcohol, and unprescribed drugs. Chemicals in these products can affect the formation and growth of the baby. Do not use any products that contain nicotine or tobacco, such as cigarettes and e-cigarettes. If you need help quitting, ask your health care provider. Visit your dentist if you have not gone yet during your pregnancy. Use a soft toothbrush to brush your teeth and be gentle when you floss. Contact a health care provider if: You have dizziness. You have mild pelvic cramps, pelvic pressure, or nagging pain in the abdominal area. You have persistent nausea, vomiting, or diarrhea. You have a bad smelling vaginal discharge. You have pain when you urinate. Get help right away if: You have a fever. You are leaking fluid from your vagina. You have spotting or bleeding from your vagina. You have severe abdominal cramping or pain. You have rapid weight gain or weight loss. You have shortness of breath with chest pain. You notice sudden or extreme swelling of your face, hands, ankles, feet, or legs. You  have not felt your baby move in over an hour. You have severe headaches that do not go away when you take medicine. You have vision changes. Summary The second trimester is from week 14 through week 27 (months 4 through 6). It is also a time when the fetus is growing rapidly. Your body goes through many changes during pregnancy. The changes vary from woman to woman. Avoid all smoking, herbs, alcohol, and unprescribed drugs. These chemicals affect the formation and growth your baby. Do not use any tobacco products, such as cigarettes, chewing tobacco, and e-cigarettes. If you need help quitting, ask your health care provider. Contact your health care provider if you have any questions. Keep all prenatal visits as told by your health care provider. This is important. This information is not intended to replace advice given to you by your health care provider. Make sure you discuss any questions you have with your health care provider. Document Released: 12/04/2001 Document Revised: 05/17/2016 Document Reviewed: 02/10/2013 [employer] Interactive Patient Education  2017 [employer]  

## 2021-06-23 DIAGNOSIS — Z419 Encounter for procedure for purposes other than remedying health state, unspecified: Secondary | ICD-10-CM | POA: Diagnosis not present

## 2021-06-29 ENCOUNTER — Ambulatory Visit (INDEPENDENT_AMBULATORY_CARE_PROVIDER_SITE_OTHER): Payer: Medicaid Other | Admitting: Women's Health

## 2021-06-29 ENCOUNTER — Ambulatory Visit (INDEPENDENT_AMBULATORY_CARE_PROVIDER_SITE_OTHER): Payer: Medicaid Other

## 2021-06-29 ENCOUNTER — Other Ambulatory Visit: Payer: Self-pay

## 2021-06-29 ENCOUNTER — Encounter: Payer: Self-pay | Admitting: Women's Health

## 2021-06-29 VITALS — BP 113/70 | HR 77 | Wt 167.4 lb

## 2021-06-29 DIAGNOSIS — Z3A20 20 weeks gestation of pregnancy: Secondary | ICD-10-CM

## 2021-06-29 DIAGNOSIS — Z3402 Encounter for supervision of normal first pregnancy, second trimester: Secondary | ICD-10-CM

## 2021-06-29 DIAGNOSIS — Z363 Encounter for antenatal screening for malformations: Secondary | ICD-10-CM

## 2021-06-29 NOTE — Patient Instructions (Signed)
Catherine Park, thank you for choosing our office today! We appreciate the opportunity to meet your healthcare needs. You may receive a short survey by mail, e-mail, or through MyChart. If you are happy with your care we would appreciate if you could take just a few minutes to complete the survey questions. We read all of your comments and take your feedback very seriously. Thank you again for choosing our office.  [facility] Team at [facility] ([address] [place], [place] [address]) Entrance C, located off of [address] Free 24/7 valet parking  Go to [email or web address] to register for FREE online childbirth classes  Call the office ([phone]) or go to [facility] if: You begin to severe cramping Your water breaks.  Sometimes it is a big gush of fluid, sometimes it is just a trickle that keeps getting your panties wet or running down your legs You have vaginal bleeding.  It is normal to have a small amount of spotting if your cervix was checked.   [place] Pediatricians/Family Doctors [facility] ([facility]): [address], [phone]           [facility]: [address], [phone]                [facility] ([facility]): [address], [phone] (call to ask if accepting patients) [facility]: [address], [place], [phone]    [place] Pediatricians/Family Doctors [facility] ([facility]): [address], [phone] [facility]: [address], [phone] [facility]: [address], [phone]  [place] [facility]  [facility] ([facility]): [phone] [facility]: [address], [phone]   [place] [facility] Doctors [facility]: [address], [phone]  [place] [facility]  [facility]: [address], [phone]  Home Blood Pressure Monitoring for Patients   Your provider has recommended that you check your blood pressure (BP) at least once a week at home. If you do not have a blood pressure cuff at home, one will be provided for you. Contact your provider if you have not received your monitor within 1 week.   Helpful Tips for Accurate Home Blood Pressure Checks  Don't smoke, exercise, or drink caffeine 30 minutes before checking your BP Use the restroom before checking your BP (a full bladder can raise your pressure) Relax in a comfortable upright chair Feet on the ground Left arm resting comfortably on a flat surface at the level of your heart Legs uncrossed Back supported Sit quietly and don't talk Place the cuff on your bare arm Adjust snuggly, so that only two fingertips can fit between your skin and the top of the cuff Check 2 readings separated by at least one minute Keep a log of your BP readings For a visual, please reference this diagram: http://[facility].care/bpdiagram  Provider Name: [facility]     Phone: [phone]  Zone 1: ALL CLEAR  Continue to monitor your symptoms:  BP reading is less than 140 (top number) or less than 90 (bottom number)  No right upper stomach pain No headaches or seeing spots No feeling nauseated or throwing up No swelling in face and hands  Zone 2: CAUTION Call your doctor's office for any of the following:  BP reading is greater than 140 (top number) or greater than   90 (bottom number)  Stomach pain under your ribs in the middle or right side Headaches or seeing spots Feeling nauseated or throwing up Swelling in face and hands  Zone 3: EMERGENCY  Seek immediate medical care if you have any of the following:  BP reading is greater than160 (top number) or greater than 110 (bottom number) Severe headaches not improving with Tylenol Serious difficulty catching your breath Any worsening symptoms from  Zone 2     Second Trimester of Pregnancy The second trimester is from week 14 through week 27 (months 4 through 6). The second trimester is often a time when you feel your best. Your body has adjusted to being pregnant, and you begin to feel better physically. Usually, morning sickness has lessened or quit completely, you may have more energy, and you may have an increase in appetite. The second trimester is also a time when the fetus is growing rapidly. At the end of the sixth month, the fetus is about 9 inches long and weighs about 1 pounds. You will likely begin to feel the baby move (quickening) between 16 and 20 weeks of pregnancy. Body changes during your second trimester Your body continues to go through many changes during your second trimester. The changes vary from woman to woman. Your weight will continue to increase. You will notice your lower abdomen bulging out. You may begin to get stretch marks on your hips, abdomen, and breasts. You may develop headaches that can be relieved by medicines. The medicines should be approved by your health care provider. You may urinate more often because the fetus is pressing on your bladder. You may develop or continue to have heartburn as a result of your pregnancy. You may develop constipation because certain hormones are causing the muscles that push waste through your intestines to slow down. You may develop hemorrhoids or swollen, bulging veins (varicose veins). You may have back pain. This is caused by: Weight gain. Pregnancy hormones that are relaxing the joints in your pelvis. A shift in weight and the muscles that support your balance. Your breasts will continue to grow and they will continue to become tender. Your gums may bleed and may be sensitive to brushing and flossing. Dark spots or blotches (chloasma, mask of pregnancy) may develop on your face. This will likely fade after the baby is born. A dark line from your belly button to  the pubic area (linea nigra) may appear. This will likely fade after the baby is born. You may have changes in your hair. These can include thickening of your hair, rapid growth, and changes in texture. Some women also have hair loss during or after pregnancy, or hair that feels dry or thin. Your hair will most likely return to normal after your baby is born.  What to expect at prenatal visits During a routine prenatal visit: You will be weighed to make sure you and the fetus are growing normally. Your blood pressure will be taken. Your abdomen will be measured to track your baby's growth. The fetal heartbeat will be listened to. Any test results from the previous visit will be discussed.  Your health care provider may ask you: How you are feeling. If you are feeling the baby move. If you have had any abnormal symptoms, such as leaking fluid, bleeding, severe headaches, or abdominal cramping. If you are using any tobacco products, including cigarettes, chewing tobacco, and electronic cigarettes. If you have any questions.  Other tests that may be performed during   your second trimester include: Blood tests that check for: Low iron levels (anemia). High blood sugar that affects pregnant women (gestational diabetes) between 24 and 28 weeks. Rh antibodies. This is to check for a protein on red blood cells (Rh factor). Urine tests to check for infections, diabetes, or protein in the urine. An ultrasound to confirm the proper growth and development of the baby. An amniocentesis to check for possible genetic problems. Fetal screens for spina bifida and Down syndrome. HIV (human immunodeficiency virus) testing. Routine prenatal testing includes screening for HIV, unless you choose not to have this test.  Follow these instructions at home: Medicines Follow your health care provider's instructions regarding medicine use. Specific medicines may be either safe or unsafe to take during  pregnancy. Take a prenatal vitamin that contains at least 600 micrograms (mcg) of folic acid. If you develop constipation, try taking a stool softener if your health care provider approves. Eating and drinking Eat a balanced diet that includes fresh fruits and vegetables, whole grains, good sources of protein such as meat, eggs, or tofu, and low-fat dairy. Your health care provider will help you determine the amount of weight gain that is right for you. Avoid raw meat and uncooked cheese. These carry germs that can cause birth defects in the baby. If you have low calcium intake from food, talk to your health care provider about whether you should take a daily calcium supplement. Limit foods that are high in fat and processed sugars, such as fried and sweet foods. To prevent constipation: Drink enough fluid to keep your urine clear or pale yellow. Eat foods that are high in fiber, such as fresh fruits and vegetables, whole grains, and beans. Activity Exercise only as directed by your health care provider. Most women can continue their usual exercise routine during pregnancy. Try to exercise for 30 minutes at least 5 days a week. Stop exercising if you experience uterine contractions. Avoid heavy lifting, wear low heel shoes, and practice good posture. A sexual relationship may be continued unless your health care provider directs you otherwise. Relieving pain and discomfort Wear a good support bra to prevent discomfort from breast tenderness. Take warm sitz baths to soothe any pain or discomfort caused by hemorrhoids. Use hemorrhoid cream if your health care provider approves. Rest with your legs elevated if you have leg cramps or low back pain. If you develop varicose veins, wear support hose. Elevate your feet for 15 minutes, 3-4 times a day. Limit salt in your diet. Prenatal Care Write down your questions. Take them to your prenatal visits. Keep all your prenatal visits as told by your health  care provider. This is important. Safety Wear your seat belt at all times when driving. Make a list of emergency phone numbers, including numbers for family, friends, the hospital, and police and fire departments. General instructions Ask your health care provider for a referral to a local prenatal education class. Begin classes no later than the beginning of month 6 of your pregnancy. Ask for help if you have counseling or nutritional needs during pregnancy. Your health care provider can offer advice or refer you to specialists for help with various needs. Do not use hot tubs, steam rooms, or saunas. Do not douche or use tampons or scented sanitary pads. Do not cross your legs for long periods of time. Avoid cat litter boxes and soil used by cats. These carry germs that can cause birth defects in the baby and possibly loss of the   fetus by miscarriage or stillbirth. Avoid all smoking, herbs, alcohol, and unprescribed drugs. Chemicals in these products can affect the formation and growth of the baby. Do not use any products that contain nicotine or tobacco, such as cigarettes and e-cigarettes. If you need help quitting, ask your health care provider. Visit your dentist if you have not gone yet during your pregnancy. Use a soft toothbrush to brush your teeth and be gentle when you floss. Contact a health care provider if: You have dizziness. You have mild pelvic cramps, pelvic pressure, or nagging pain in the abdominal area. You have persistent nausea, vomiting, or diarrhea. You have a bad smelling vaginal discharge. You have pain when you urinate. Get help right away if: You have a fever. You are leaking fluid from your vagina. You have spotting or bleeding from your vagina. You have severe abdominal cramping or pain. You have rapid weight gain or weight loss. You have shortness of breath with chest pain. You notice sudden or extreme swelling of your face, hands, ankles, feet, or legs. You  have not felt your baby move in over an hour. You have severe headaches that do not go away when you take medicine. You have vision changes. Summary The second trimester is from week 14 through week 27 (months 4 through 6). It is also a time when the fetus is growing rapidly. Your body goes through many changes during pregnancy. The changes vary from woman to woman. Avoid all smoking, herbs, alcohol, and unprescribed drugs. These chemicals affect the formation and growth your baby. Do not use any tobacco products, such as cigarettes, chewing tobacco, and e-cigarettes. If you need help quitting, ask your health care provider. Contact your health care provider if you have any questions. Keep all prenatal visits as told by your health care provider. This is important. This information is not intended to replace advice given to you by your health care provider. Make sure you discuss any questions you have with your health care provider. Document Released: 12/04/2001 Document Revised: 05/17/2016 Document Reviewed: 02/10/2013 Elsevier Interactive Patient Education  2017 [employer].

## 2021-06-29 NOTE — Progress Notes (Addendum)
[place] 20+3 wks,breech,anterior placenta gr 0,cx 3.2 cm.normal ovaries,fhr 138 bpm,svp of fluid 3.5 cm,efw 358 g 49%,anatomy complete,no obvious abnormalities

## 2021-06-29 NOTE — Progress Notes (Signed)
LOW-RISK PREGNANCY VISIT Patient name: Catherine Park MRN [id number]  Date of birth: September 18, 1996 Chief Complaint:   Routine Prenatal Visit (U/s/Recent MRI-feeling anxious since)  History of Present Illness:   Catherine Park is a 25 y.o. G58P0000 female at [redacted]w[redacted]d with an Estimated Date of Delivery: 11/13/21 being seen today for ongoing management of a low-risk pregnancy.   Today she reports  no pain since last visit. Had gone to ED 6/24 w/ RUQ/mid pain and n/v. Labs/MRI normal. Ever since MRI has become claustrophobic. Doesn't like being in confined/tight spaces, esp if hot. . Contractions: Not present. Vag. Bleeding: None.  Movement: Present. denies leaking of fluid.  Depression screen [facility] 2/9 05/10/2021 10/07/2019  Decreased Interest 0 0  Down, Depressed, Hopeless 0 0  PHQ - 2 Score 0 0  Altered sleeping 1 -  Tired, decreased energy 0 -  Change in appetite 1 -  Feeling bad or failure about yourself  0 -  Trouble concentrating 1 -  Moving slowly or fidgety/restless 0 -  Suicidal thoughts 0 -  PHQ-9 Score 3 -     GAD 7 : Generalized Anxiety Score 05/10/2021  Nervous, Anxious, on Edge 1  Control/stop worrying 1  Worry too much - different things 1  Trouble relaxing 0  Restless 1  Easily annoyed or irritable 1  Afraid - awful might happen 0  Total GAD 7 Score 5      Review of Systems:   Pertinent items are noted in HPI Denies abnormal vaginal discharge w/ itching/odor/irritation, headaches, visual changes, shortness of breath, chest pain, abdominal pain, severe nausea/vomiting, or problems with urination or bowel movements unless otherwise stated above. Pertinent History Reviewed:  Reviewed past medical,surgical, social, obstetrical and family history.  Reviewed problem list, medications and allergies. Physical Assessment:   Vitals:   06/29/21 1619  BP: 113/70  Pulse: 77  Weight: 167 lb 6.4 oz (75.9 kg)  Body mass index is 30.62 kg/m.        Physical  Examination:   General appearance: Well appearing, and in no distress  Mental status: Alert, oriented to person, place, and time  Skin: Warm & dry  Cardiovascular: Normal heart rate noted  Respiratory: Normal respiratory effort, no distress  Abdomen: Soft, gravid, nontender  Pelvic: Cervical exam deferred         Extremities: Edema: Trace  Fetal Status: Fetal Heart Rate (bpm): 138 u/s   Movement: Present  [place] 20+3 wks,breech,anterior placenta gr 0,cx 4.9 cm.normal ovaries,fhr 138 bpm,svp of fluid 3.5 cm,efw 358 g 49%,anatomy complete,no obvious abnormalities,please have pt come back today for additional picture for PT  Chaperone: N/A   No results found for this or any previous visit (from the past 24 hour(s)).  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  Labs/procedures today: U/S  Plan:  Continue routine obstetrical care  Next visit: prefers in person    Reviewed: Preterm labor symptoms and general obstetric precautions including but not limited to vaginal bleeding, contractions, leaking of fluid and fetal movement were reviewed in detail with the patient.  All questions were answered.   Follow-up: Return in about 4 weeks (around 07/27/2021) for LROB, CNM, in person.  Future Appointments  Date Time Provider Department Center  07/27/2021  4:10 PM Jacklyn Shell, CNM  [facility]    No orders of the defined types were placed in this encounter.  Cheral Marker CNM, [facility] 06/29/2021 4:53 PM

## 2021-07-24 DIAGNOSIS — Z419 Encounter for procedure for purposes other than remedying health state, unspecified: Secondary | ICD-10-CM | POA: Diagnosis not present

## 2021-07-27 ENCOUNTER — Ambulatory Visit (INDEPENDENT_AMBULATORY_CARE_PROVIDER_SITE_OTHER): Payer: Medicaid Other | Admitting: Advanced Practice Midwife

## 2021-07-27 ENCOUNTER — Other Ambulatory Visit: Payer: Self-pay

## 2021-07-27 VITALS — BP 117/74 | HR 89 | Wt 174.0 lb

## 2021-07-27 DIAGNOSIS — Z3A24 24 weeks gestation of pregnancy: Secondary | ICD-10-CM

## 2021-07-27 DIAGNOSIS — R1011 Right upper quadrant pain: Secondary | ICD-10-CM

## 2021-07-27 DIAGNOSIS — Z3402 Encounter for supervision of normal first pregnancy, second trimester: Secondary | ICD-10-CM

## 2021-07-27 MED ORDER — TERCONAZOLE 0.4 % VA CREA
1.0000 | TOPICAL_CREAM | Freq: Every day | VAGINAL | 0 refills | Status: DC
Start: 2021-07-27 — End: 2021-09-04

## 2021-07-27 NOTE — Patient Instructions (Signed)

## 2021-07-27 NOTE — Progress Notes (Signed)
we  LOW-RISK PREGNANCY VISIT Patient name: Catherine Park MRN [id number]  Date of birth: [birth year] Chief Complaint:   Routine Prenatal Visit  History of Present Illness:   Catherine Park is a 25 y.o. G52P0000 female at [redacted]w[redacted]d with an Estimated Date of Delivery: 11/13/21 being seen today for ongoing management of a low-risk pregnancy.  Today she reports still having occ RUQ pain, was worked up last month (MRI, CT for appy, no [place]). Some nausea, may be worse after fattty fried foods. Contractions: Not present. Vag. Bleeding: None.  Movement: Present. denies leaking of fluid. Review of Systems:   Pertinent items are noted in HPI Denies abnormal vaginal discharge w/ itching/odor/irritation, headaches, visual changes, shortness of breath, chest pain, abdominal pain, severe nausea/vomiting, or problems with urination or bowel movements unless otherwise stated above. Pertinent History Reviewed:  Reviewed past medical,surgical, social, obstetrical and family history.  Reviewed problem list, medications and allergies. Physical Assessment:   Vitals:   07/27/21 1611  BP: 117/74  Pulse: 89  Weight: 78.9 kg  Body mass index is 31.83 kg/m.        Physical Examination:   General appearance: Well appearing, and in no distress  Mental status: Alert, oriented to person, place, and time  Skin: Warm & dry  Cardiovascular: Normal heart rate noted  Respiratory: Normal respiratory effort, no distress  Abdomen: Soft, gravid, nontender  Pelvic: Cervical exam deferred         Extremities: Edema: Trace  Fetal Status: Fetal Heart Rate (bpm): 145 Fundal Height: 25 cm Movement: Present    Chaperone: n/a    Results for orders placed or performed in visit on 07/27/21 (from the past 24 hour(s))  Amylase   Collection Time: 07/27/21  4:56 PM  Result Value Ref Range   Amylase 47 31 - 110 U/L  Lipase   Collection Time: 07/27/21  4:56 PM  Result Value Ref Range   Lipase 30 14 - 72 U/L     Assessment & Plan:  1) Low-risk pregnancy G1P0000 at [redacted]w[redacted]d with an Estimated Date of Delivery: 11/13/21   2) RUQ pain, check GB US   Meds:  Meds ordered this encounter  Medications   terconazole (TERAZOL 7) 0.4 % vaginal cream    Sig: Place 1 applicator vaginally at bedtime.    Dispense:  45 g    Refill:  0    Order Specific Question:   Supervising Provider    Answer:   Lazaro Arms [[id number]]   Labs/procedures today:  Orders Placed This Encounter  Procedures   US Abdomen Limited RUQ (LIVER/GB)    Standing Status:   Future    Standing Expiration Date:   07/27/2022    Scheduling Instructions:     Please schedule for the next week or two    Order Specific Question:   Reason for Exam (SYMPTOM  OR DIAGNOSIS REQUIRED)    Answer:   RUQ pain/pregnant    Order Specific Question:   Preferred imaging location?    Answer:   [facility]   Amylase   Lipase     Plan:  Continue routine obstetrical care  Next visit: prefers in person    Reviewed: Preterm labor symptoms and general obstetric precautions including but not limited to vaginal bleeding, contractions, leaking of fluid and fetal movement were reviewed in detail with the patient.  All questions were answered. Has home bp cuff. Check bp weekly, let us know if >140/90.   Follow-up: Return in about 3  weeks (around 08/17/2021) for PN2/LROB.  Orders Placed This Encounter  Procedures   US Abdomen Limited RUQ (LIVER/GB)   Amylase   Lipase   Jacklyn Shell DNP, CNM 07/28/2021 12:19 PM

## 2021-07-28 LAB — AMYLASE: Amylase: 47 U/L (ref 31–110)

## 2021-07-28 LAB — LIPASE: Lipase: 30 U/L (ref 14–72)

## 2021-08-07 ENCOUNTER — Other Ambulatory Visit: Payer: Self-pay

## 2021-08-07 ENCOUNTER — Ambulatory Visit (HOSPITAL_COMMUNITY)
Admission: RE | Admit: 2021-08-07 | Discharge: 2021-08-07 | Disposition: A | Discharge status: Home / Self Care | Payer: Medicaid Other | Source: Ambulatory Visit | Attending: Advanced Practice Midwife | Admitting: Advanced Practice Midwife

## 2021-08-07 DIAGNOSIS — R1011 Right upper quadrant pain: Secondary | ICD-10-CM | POA: Diagnosis not present

## 2021-08-07 DIAGNOSIS — Z3402 Encounter for supervision of normal first pregnancy, second trimester: Secondary | ICD-10-CM | POA: Insufficient documentation

## 2021-08-11 DIAGNOSIS — D485 Neoplasm of uncertain behavior of skin: Secondary | ICD-10-CM | POA: Diagnosis not present

## 2021-08-11 DIAGNOSIS — L980 Pyogenic granuloma: Secondary | ICD-10-CM | POA: Diagnosis not present

## 2021-08-17 ENCOUNTER — Other Ambulatory Visit: Payer: Medicaid Other

## 2021-08-17 ENCOUNTER — Encounter: Payer: Self-pay | Admitting: Obstetrics & Gynecology

## 2021-08-17 ENCOUNTER — Ambulatory Visit (INDEPENDENT_AMBULATORY_CARE_PROVIDER_SITE_OTHER): Payer: Medicaid Other | Admitting: Obstetrics & Gynecology

## 2021-08-17 ENCOUNTER — Other Ambulatory Visit: Payer: Self-pay

## 2021-08-17 VITALS — BP 115/73 | HR 89 | Wt 178.0 lb

## 2021-08-17 DIAGNOSIS — Z23 Encounter for immunization: Secondary | ICD-10-CM | POA: Diagnosis not present

## 2021-08-17 DIAGNOSIS — Z3A27 27 weeks gestation of pregnancy: Secondary | ICD-10-CM

## 2021-08-17 DIAGNOSIS — Z3402 Encounter for supervision of normal first pregnancy, second trimester: Secondary | ICD-10-CM

## 2021-08-17 NOTE — Progress Notes (Signed)
   LOW-RISK PREGNANCY VISIT Patient name: Catherine Park MRN [id number]  Date of birth: 10-13-1996 Chief Complaint:   Routine Prenatal Visit (PN2 today)  History of Present Illness:   Catherine Park is a 25 y.o. G62P0000 female at [redacted]w[redacted]d with an Estimated Date of Delivery: 11/13/21 being seen today for ongoing management of a low-risk pregnancy.  Depression screen [facility] 2/9 08/17/2021 05/10/2021 10/07/2019  Decreased Interest 0 0 0  Down, Depressed, Hopeless 0 0 0  PHQ - 2 Score 0 0 0  Altered sleeping 1 1 -  Tired, decreased energy 1 0 -  Change in appetite 1 1 -  Feeling bad or failure about yourself  0 0 -  Trouble concentrating 1 1 -  Moving slowly or fidgety/restless 0 0 -  Suicidal thoughts 0 0 -  PHQ-9 Score 4 3 -    Today she reports no complaints. Contractions: Not present. Vag. Bleeding: None.  Movement: Present. denies leaking of fluid. Review of Systems:   Pertinent items are noted in HPI Denies abnormal vaginal discharge w/ itching/odor/irritation, headaches, visual changes, shortness of breath, chest pain, abdominal pain, severe nausea/vomiting, or problems with urination or bowel movements unless otherwise stated above. Pertinent History Reviewed:  Reviewed past medical,surgical, social, obstetrical and family history.  Reviewed problem list, medications and allergies. Physical Assessment:   Vitals:   08/17/21 0855  BP: 115/73  Pulse: 89  Weight: 178 lb (80.7 kg)  Body mass index is 32.56 kg/m.        Physical Examination:   General appearance: Well appearing, and in no distress  Mental status: Alert, oriented to person, place, and time  Skin: Warm & dry  Cardiovascular: Normal heart rate noted  Respiratory: Normal respiratory effort, no distress  Abdomen: Soft, gravid, nontender  Pelvic: Cervical exam deferred         Extremities: Edema: Trace  Fetal Status: Fetal Heart Rate (bpm): 140 Fundal Height: 28 cm Movement: Present    Chaperone: n/a     No results found for this or any previous visit (from the past 24 hour(s)).  Assessment & Plan:  1) Low-risk pregnancy G1P0000 at [redacted]w[redacted]d with an Estimated Date of Delivery: 11/13/21   2) PN2 today,    Meds: No orders of the defined types were placed in this encounter.  Labs/procedures today: PN2  Plan:  Continue routine obstetrical care  Next visit: prefers in person    Reviewed: Preterm labor symptoms and general obstetric precautions including but not limited to vaginal bleeding, contractions, leaking of fluid and fetal movement were reviewed in detail with the patient.  All questions were answered. Has home bp cuff. Rx faxed to . Check bp weekly, let us know if >140/90.   Follow-up: Return in about 3 weeks (around 09/07/2021) for LROB.  Orders Placed This Encounter  Procedures   Tdap vaccine greater than or equal to 7yo IM    Lazaro Arms, MD 08/17/2021 9:26 AM

## 2021-08-18 LAB — CBC
Hematocrit: 37.4 % (ref 34.0–46.6)
Hemoglobin: 12.3 g/dL (ref 11.1–15.9)
MCH: 29 pg (ref 26.6–33.0)
MCHC: 32.9 g/dL (ref 31.5–35.7)
MCV: 88 fL (ref 79–97)
Platelets: 266 10*3/uL (ref 150–450)
RBC: 4.24 x10E6/uL (ref 3.77–5.28)
RDW: 12.8 % (ref 11.7–15.4)
WBC: 12.7 10*3/uL — ABNORMAL HIGH (ref 3.4–10.8)

## 2021-08-18 LAB — HIV ANTIBODY (ROUTINE TESTING W REFLEX): HIV Screen 4th Generation wRfx: NONREACTIVE

## 2021-08-18 LAB — RPR: RPR Ser Ql: NONREACTIVE

## 2021-08-18 LAB — ANTIBODY SCREEN: Antibody Screen: NEGATIVE

## 2021-08-18 LAB — GLUCOSE TOLERANCE, 2 HOURS W/ 1HR
Glucose, 1 hour: 137 mg/dL (ref 65–179)
Glucose, 2 hour: 106 mg/dL (ref 65–152)
Glucose, Fasting: 85 mg/dL (ref 65–91)

## 2021-08-24 DIAGNOSIS — Z419 Encounter for procedure for purposes other than remedying health state, unspecified: Secondary | ICD-10-CM | POA: Diagnosis not present

## 2021-09-04 ENCOUNTER — Other Ambulatory Visit (INDEPENDENT_AMBULATORY_CARE_PROVIDER_SITE_OTHER): Payer: Medicaid Other

## 2021-09-04 ENCOUNTER — Other Ambulatory Visit: Payer: Self-pay

## 2021-09-04 VITALS — BP 107/68 | HR 97

## 2021-09-04 DIAGNOSIS — R399 Unspecified symptoms and signs involving the genitourinary system: Secondary | ICD-10-CM | POA: Diagnosis not present

## 2021-09-04 LAB — POCT URINALYSIS DIPSTICK OB
Glucose, UA: NEGATIVE
Ketones, UA: NEGATIVE
Nitrite, UA: NEGATIVE
POC,PROTEIN,UA: NEGATIVE

## 2021-09-04 NOTE — Progress Notes (Addendum)
   NURSE VISIT- UTI SYMPTOMS   SUBJECTIVE:  Catherine Park is a 25 y.o. G80P0000 female here for UTI symptoms. She is [redacted]w[redacted]d pregnant. She reports cloudy urine, flank pain bilaterally, and urinary frequency.  OBJECTIVE:  BP 107/68   Pulse 97   LMP 01/30/2021 (Exact Date)   Appears well, in no apparent distress  Results for orders placed or performed in visit on 09/04/21 (from the past 24 hour(s))  POC Urinalysis Dipstick OB   Collection Time: 09/04/21  9:08 AM  Result Value Ref Range   Color, UA     Clarity, UA     Glucose, UA Negative Negative   Bilirubin, UA     Ketones, UA neg    Spec Grav, UA     Blood, UA trace hemolyzed    pH, UA     POC,PROTEIN,UA Negative Negative, Trace, Small (1+), Moderate (2+), Large (3+), 4+   Urobilinogen, UA     Nitrite, UA neg    Leukocytes, UA Large (3+) (A) Negative   Appearance     Odor      ASSESSMENT: Pregnancy [redacted]w[redacted]d with UTI symptoms and negative nitrites  PLAN: Discussed with Dr. Charlotta Newton   Rx sent by provider today: No Urine culture sent Call or return to clinic prn if these symptoms worsen or fail to improve as anticipated. Follow-up: as scheduled   Dionna Wiedemann A Nyjai Graff  09/04/2021 9:10 AM   Chart reviewed for nurse visit. Agree with plan of care.  Myna Hidalgo, DO 09/05/2021 8:20 AM

## 2021-09-07 LAB — URINE CULTURE

## 2021-09-08 ENCOUNTER — Other Ambulatory Visit: Payer: Self-pay

## 2021-09-08 ENCOUNTER — Encounter: Payer: Self-pay | Admitting: Obstetrics & Gynecology

## 2021-09-08 ENCOUNTER — Other Ambulatory Visit (HOSPITAL_COMMUNITY)
Admission: RE | Admit: 2021-09-08 | Discharge: 2021-09-08 | Disposition: A | Discharge status: Home / Self Care | Payer: Medicaid Other | Source: Ambulatory Visit | Attending: Obstetrics & Gynecology | Admitting: Obstetrics & Gynecology

## 2021-09-08 ENCOUNTER — Ambulatory Visit (INDEPENDENT_AMBULATORY_CARE_PROVIDER_SITE_OTHER): Payer: Medicaid Other | Admitting: Obstetrics & Gynecology

## 2021-09-08 VITALS — BP 119/76 | HR 106 | Wt 182.4 lb

## 2021-09-08 DIAGNOSIS — O26893 Other specified pregnancy related conditions, third trimester: Secondary | ICD-10-CM

## 2021-09-08 DIAGNOSIS — Z3A30 30 weeks gestation of pregnancy: Secondary | ICD-10-CM

## 2021-09-08 DIAGNOSIS — Z3403 Encounter for supervision of normal first pregnancy, third trimester: Secondary | ICD-10-CM

## 2021-09-08 DIAGNOSIS — N898 Other specified noninflammatory disorders of vagina: Secondary | ICD-10-CM

## 2021-09-08 MED ORDER — NUVESSA 1.3 % VA GEL: 1.0000 | Freq: Once | VAGINAL | 1 refills | Status: AC

## 2021-09-08 NOTE — Progress Notes (Signed)
   LOW-RISK PREGNANCY VISIT Patient name: Jalise Zawistowski MRN [id number]  Date of birth: 14-Jun-1996 Chief Complaint:   Routine Prenatal Visit (Heavy discharge)  History of Present Illness:   Ayannah Faddis is a 25 y.o. G80P0000 female at [redacted]w[redacted]d with an Estimated Date of Delivery: 11/13/21 being seen today for ongoing management of a low-risk pregnancy.   Sunday noted pink when she wiped.  Pt came in for RN visit- UA concerning, but final culture negative.  Pt does not vaginal discharge, no odor or itch.  Last night also noted a few drops of almost mixed blood when she wiped.  No pelvic pain.    Depression screen [facility] 2/9 08/17/2021 05/10/2021 10/07/2019  Decreased Interest 0 0 0  Down, Depressed, Hopeless 0 0 0  PHQ - 2 Score 0 0 0  Altered sleeping 1 1 -  Tired, decreased energy 1 0 -  Change in appetite 1 1 -  Feeling bad or failure about yourself  0 0 -  Trouble concentrating 1 1 -  Moving slowly or fidgety/restless 0 0 -  Suicidal thoughts 0 0 -  PHQ-9 Score 4 3 -    Today she reports  discharge . Contractions: Not present. Vag. Bleeding: None.  Movement: Present. denies leaking of fluid. Review of Systems:   Pertinent items are noted in HPI Denies headaches, visual changes, shortness of breath, chest pain, abdominal pain, severe nausea/vomiting, or problems with urination or bowel movements unless otherwise stated above. Pertinent History Reviewed:  Reviewed past medical,surgical, social, obstetrical and family history.  Reviewed problem list, medications and allergies.  Physical Assessment:   Vitals:   09/08/21 1214  BP: 119/76  Pulse: (!) 106  Weight: 182 lb 6.4 oz (82.7 kg)  Body mass index is 33.36 kg/m.        Physical Examination:   General appearance: Well appearing, and in no distress  Mental status: Alert, oriented to person, place, and time  Skin: Warm & dry  Respiratory: Normal respiratory effort, no distress  Abdomen: Soft, gravid,  nontender  Pelvic: Cervical exam deferred  Dilation: Closed    on visual exam  Extremities: Edema: None  Psych:  mood and affect appropriate  Fetal Status: Fetal Heart Rate (bpm): 155 Fundal Height: 29 cm Movement: Present    Chaperone: Latisha Cresenzo    No results found for this or any previous visit (from the past 24 hour(s)).   Assessment & Plan:  1) Low-risk pregnancy G1P0000 at [redacted]w[redacted]d with an Estimated Date of Delivery: 11/13/21   2) Vaginal discharge vaginitis obtained- concern for both BV and yeast- Rx sent in for [facility]- await culture to see if yeast treatment is also indicated.  previously treated for yeast and stated symptoms worsened   Meds: No orders of the defined types were placed in this encounter.  Labs/procedures today: vaginitis panel, doppler  Plan:  Continue routine obstetrical care Next visit: prefers in person    Reviewed: Preterm labor symptoms and general obstetric precautions including but not limited to vaginal bleeding, contractions, leaking of fluid and fetal movement were reviewed in detail with the patient.  All questions were answered. Pt has home bp cuff. Check bp weekly, let us know if >140/90.   Follow-up: Return in about 2 weeks (around 09/22/2021) for LROB visit.  No orders of the defined types were placed in this encounter.   Myna Hidalgo, DO Attending Obstetrician & Gynecologist, [facility] for [employer], [facility]

## 2021-09-11 ENCOUNTER — Other Ambulatory Visit: Payer: Self-pay | Admitting: Obstetrics & Gynecology

## 2021-09-11 LAB — CERVICOVAGINAL ANCILLARY ONLY
Bacterial Vaginitis (gardnerella): NEGATIVE
Candida Glabrata: NEGATIVE
Candida Vaginitis: POSITIVE — AB
Chlamydia: NEGATIVE
Comment: NEGATIVE
Comment: NEGATIVE
Comment: NEGATIVE
Comment: NEGATIVE
Comment: NEGATIVE
Comment: NORMAL
Neisseria Gonorrhea: NEGATIVE
Trichomonas: POSITIVE — AB

## 2021-09-11 MED ORDER — TERCONAZOLE 0.4 % VA CREA: 1.0000 | TOPICAL_CREAM | Freq: Every day | VAGINAL | 1 refills | Status: AC

## 2021-09-11 MED ORDER — METRONIDAZOLE 500 MG PO TABS: 500.0000 mg | ORAL_TABLET | Freq: Two times a day (BID) | ORAL | 0 refills | Status: AC

## 2021-09-12 NOTE — Progress Notes (Signed)
CCDR completed and faxed

## 2021-09-22 ENCOUNTER — Other Ambulatory Visit: Payer: Self-pay

## 2021-09-22 ENCOUNTER — Encounter: Payer: Self-pay | Admitting: Women's Health

## 2021-09-22 ENCOUNTER — Ambulatory Visit (INDEPENDENT_AMBULATORY_CARE_PROVIDER_SITE_OTHER): Payer: Medicaid Other | Admitting: Women's Health

## 2021-09-22 VITALS — BP 116/74 | HR 102 | Wt 185.0 lb

## 2021-09-22 DIAGNOSIS — Z3403 Encounter for supervision of normal first pregnancy, third trimester: Secondary | ICD-10-CM

## 2021-09-22 NOTE — Progress Notes (Signed)
LOW-RISK PREGNANCY VISIT Patient name: Catherine Park MRN [id number]  Date of birth: 24-Jan-1996 Chief Complaint:   No chief complaint on file.  History of Present Illness:   Catherine Park is a 25 y.o. G79P0000 female at [redacted]w[redacted]d with an Estimated Date of Delivery: 11/13/21 being seen today for ongoing management of a low-risk pregnancy.   Today she reports no complaints. Taking flagyl for recent +trich. Contractions: Not present. Vag. Bleeding: None.  Movement: Present. denies leaking of fluid.  Depression screen [facility] 2/9 08/17/2021 05/10/2021 10/07/2019  Decreased Interest 0 0 0  Down, Depressed, Hopeless 0 0 0  PHQ - 2 Score 0 0 0  Altered sleeping 1 1 -  Tired, decreased energy 1 0 -  Change in appetite 1 1 -  Feeling bad or failure about yourself  0 0 -  Trouble concentrating 1 1 -  Moving slowly or fidgety/restless 0 0 -  Suicidal thoughts 0 0 -  PHQ-9 Score 4 3 -     GAD 7 : Generalized Anxiety Score 08/17/2021 05/10/2021  Nervous, Anxious, on Edge 0 1  Control/stop worrying 0 1  Worry too much - different things 0 1  Trouble relaxing 0 0  Restless 0 1  Easily annoyed or irritable 0 1  Afraid - awful might happen 0 0  Total GAD 7 Score 0 5      Review of Systems:   Pertinent items are noted in HPI Denies abnormal vaginal discharge w/ itching/odor/irritation, headaches, visual changes, shortness of breath, chest pain, abdominal pain, severe nausea/vomiting, or problems with urination or bowel movements unless otherwise stated above. Pertinent History Reviewed:  Reviewed past medical,surgical, social, obstetrical and family history.  Reviewed problem list, medications and allergies. Physical Assessment:   Vitals:   09/22/21 1227  BP: 116/74  Pulse: (!) 102  Weight: 185 lb (83.9 kg)  Body mass index is 33.84 kg/m.        Physical Examination:   General appearance: Well appearing, and in no distress  Mental status: Alert, oriented to person, place,  and time  Skin: Warm & dry  Cardiovascular: Normal heart rate noted  Respiratory: Normal respiratory effort, no distress  Abdomen: Soft, gravid, nontender  Pelvic: Cervical exam deferred         Extremities: Edema: None  Fetal Status: Fetal Heart Rate (bpm): 140 Fundal Height: 31 cm Movement: Present    Chaperone: N/A   No results found for this or any previous visit (from the past 24 hour(s)).  Assessment & Plan:  1) Low-risk pregnancy G1P0000 at [redacted]w[redacted]d with an Estimated Date of Delivery: 11/13/21   2) Recent + trichomonas, pt & partner to finish flagyl, no sex until at least 7d from both finishing meds, POC 4wks from tx   Meds: No orders of the defined types were placed in this encounter.  Labs/procedures today: none  Plan:  Continue routine obstetrical care  Next visit: prefers in person    Reviewed: Preterm labor symptoms and general obstetric precautions including but not limited to vaginal bleeding, contractions, leaking of fluid and fetal movement were reviewed in detail with the patient.  All questions were answered. Does have home bp cuff. Office bp cuff given: not applicable. Check bp weekly, let us know if consistently >140 and/or >90.  Follow-up: Return in about 2 weeks (around 10/06/2021) for LROB, CNM, in person.  Future Appointments  Date Time Provider Department Center  10/06/2021 11:50 AM Cheral Marker, CNM [facility]  No orders of the defined types were placed in this encounter.  Cheral Marker CNM, [facility] 09/22/2021 12:51 PM

## 2021-09-22 NOTE — Patient Instructions (Signed)
Catherine Park, thank you for choosing our office today! We appreciate the opportunity to meet your healthcare needs. You may receive a short survey by mail, e-mail, or through MyChart. If you are happy with your care we would appreciate if you could take just a few minutes to complete the survey questions. We read all of your comments and take your feedback very seriously. Thank you again for choosing our office.  [facility]  [facility] ([address] [place], [place] [address]) Entrance C, located off of [address] Free 24/7 valet parking   CLASSES: Go to [email or web address] to register for classes (childbirth, breastfeeding, waterbirth, infant CPR, daddy bootcamp, etc.)  Call the office ([phone]) or go to [facility] if: You begin to have strong, frequent contractions Your water breaks.  Sometimes it is a big gush of fluid, sometimes it is just a trickle that keeps getting your panties wet or running down your legs You have vaginal bleeding.  It is normal to have a small amount of spotting if your cervix was checked.  You don't feel your baby moving like normal.  If you don't, get you something to eat and drink and lay down and focus on feeling your baby move.   If your baby is still not moving like normal, you should call the office or go to [facility].  Call the office ([phone]) or go to [facility] for these signs of pre-eclampsia: Severe headache that does not go away with Tylenol Visual changes- seeing spots, double, blurred vision Pain under your right breast or upper abdomen that does not go away with Tums or heartburn medicine Nausea and/or vomiting Severe swelling in your hands, feet, and face   Tdap Vaccine It is recommended that you get the Tdap vaccine during the third trimester of EACH pregnancy to help protect your baby from getting pertussis (whooping cough) 27-36 weeks is the BEST time to do  this so that you can pass the protection on to your baby. During pregnancy is better than after pregnancy, but if you are unable to get it during pregnancy it will be offered at the hospital.  You can get this vaccine with us, at the health department, your family doctor, or some local pharmacies Everyone who will be around your baby should also be up-to-date on their vaccines before the baby comes. Adults (who are not pregnant) only need 1 dose of Tdap during adulthood.   [place] Pediatricians/Family Doctors [facility] ([facility]): [address], [phone]           [facility]: [address], [phone]                [facility] ([facility]): [address], [phone] (call to ask if accepting patients) [facility]: [address], [place], [phone]    [place] Pediatricians/Family Doctors [facility] ([facility]): [address], [phone] [facility]: [address], [phone] [facility]: [address], [phone]  [place] [facility]  [facility] ([facility]): [phone] [facility]: [address], [phone]   [place] Family Doctors [facility]: [address], [phone]  [place] [facility]  [facility]: [address], [phone]  Home Blood Pressure Monitoring for Patients   Your provider has recommended that you check your   blood pressure (BP) at least once a week at home. If you do not have a blood pressure cuff at home, one will be provided for you. Contact your provider if you have not received your monitor within 1 week.   Helpful Tips for Accurate Home Blood Pressure Checks  Don't smoke, exercise, or drink caffeine 30 minutes before checking your BP Use the restroom before checking your BP (a full bladder can raise your  pressure) Relax in a comfortable upright chair Feet on the ground Left arm resting comfortably on a flat surface at the level of your heart Legs uncrossed Back supported Sit quietly and don't talk Place the cuff on your bare arm Adjust snuggly, so that only two fingertips can fit between your skin and the top of the cuff Check 2 readings separated by at least one minute Keep a log of your BP readings For a visual, please reference this diagram: http://ccnc.care/bpdiagram  Provider Name: [facility]     Phone: [phone]  Zone 1: ALL CLEAR  Continue to monitor your symptoms:  BP reading is less than 140 (top number) or less than 90 (bottom number)  No right upper stomach pain No headaches or seeing spots No feeling nauseated or throwing up No swelling in face and hands  Zone 2: CAUTION Call your doctor's office for any of the following:  BP reading is greater than 140 (top number) or greater than 90 (bottom number)  Stomach pain under your ribs in the middle or right side Headaches or seeing spots Feeling nauseated or throwing up Swelling in face and hands  Zone 3: EMERGENCY  Seek immediate medical care if you have any of the following:  BP reading is greater than160 (top number) or greater than 110 (bottom number) Severe headaches not improving with Tylenol Serious difficulty catching your breath Any worsening symptoms from Zone 2  Preterm Labor and Birth Information  The normal length of a pregnancy is 39-41 weeks. Preterm labor is when labor starts before 37 completed weeks of pregnancy. What are the risk factors for preterm labor? Preterm labor is more likely to occur in women who: Have certain infections during pregnancy such as a bladder infection, sexually transmitted infection, or infection inside the uterus (chorioamnionitis). Have a shorter-than-normal cervix. Have gone into preterm labor before. Have had surgery on their cervix. Are younger than age 17  or older than age 35. Are African American. Are pregnant with twins or multiple babies (multiple gestation). Take street drugs or smoke while pregnant. Do not gain enough weight while pregnant. Became pregnant shortly after having been pregnant. What are the symptoms of preterm labor? Symptoms of preterm labor include: Cramps similar to those that can happen during a menstrual period. The cramps may happen with diarrhea. Pain in the abdomen or lower back. Regular uterine contractions that may feel like tightening of the abdomen. A feeling of increased pressure in the pelvis. Increased watery or bloody mucus discharge from the vagina. Water breaking (ruptured amniotic sac). Why is it important to recognize signs of preterm labor? It is important to recognize signs of preterm labor because babies who are born prematurely may not be fully developed. This can put them at an increased risk for: Long-term (chronic) heart and lung problems. Difficulty immediately after birth with regulating body systems, including blood sugar, body temperature, heart rate, and breathing rate. Bleeding in the brain. Cerebral palsy. Learning difficulties. Death. These risks are highest for babies who are born before 34 weeks   of pregnancy. How is preterm labor treated? Treatment depends on the length of your pregnancy, your condition, and the health of your baby. It may involve: Having a stitch (suture) placed in your cervix to prevent your cervix from opening too early (cerclage). Taking or being given medicines, such as: Hormone medicines. These may be given early in pregnancy to help support the pregnancy. Medicine to stop contractions. Medicines to help mature the baby's lungs. These may be prescribed if the risk of delivery is high. Medicines to prevent your baby from developing cerebral palsy. If the labor happens before 34 weeks of pregnancy, you may need to stay in the hospital. What should I do if I  think I am in preterm labor? If you think that you are going into preterm labor, call your health care provider right away. How can I prevent preterm labor in future pregnancies? To increase your chance of having a full-term pregnancy: Do not use any tobacco products, such as cigarettes, chewing tobacco, and e-cigarettes. If you need help quitting, ask your health care provider. Do not use street drugs or medicines that have not been prescribed to you during your pregnancy. Talk with your health care provider before taking any herbal supplements, even if you have been taking them regularly. Make sure you gain a healthy amount of weight during your pregnancy. Watch for infection. If you think that you might have an infection, get it checked right away. Make sure to tell your health care provider if you have gone into preterm labor before. This information is not intended to replace advice given to you by your health care provider. Make sure you discuss any questions you have with your health care provider. Document Revised: 04/03/2019 Document Reviewed: 05/02/2016 Elsevier Patient Education  2020 Elsevier Inc.   

## 2021-09-23 DIAGNOSIS — Z419 Encounter for procedure for purposes other than remedying health state, unspecified: Secondary | ICD-10-CM | POA: Diagnosis not present

## 2021-09-29 ENCOUNTER — Inpatient Hospital Stay (HOSPITAL_COMMUNITY)
Admission: AD | Admit: 2021-09-29 | Discharge: 2021-09-29 | Disposition: A | Discharge status: Home / Self Care | Payer: Medicaid Other | Attending: Obstetrics and Gynecology | Admitting: Obstetrics and Gynecology

## 2021-09-29 ENCOUNTER — Encounter (HOSPITAL_COMMUNITY): Payer: Self-pay | Admitting: Obstetrics and Gynecology

## 2021-09-29 DIAGNOSIS — Z3A33 33 weeks gestation of pregnancy: Secondary | ICD-10-CM | POA: Insufficient documentation

## 2021-09-29 DIAGNOSIS — O4703 False labor before 37 completed weeks of gestation, third trimester: Secondary | ICD-10-CM | POA: Diagnosis not present

## 2021-09-29 DIAGNOSIS — O4700 False labor before 37 completed weeks of gestation, unspecified trimester: Secondary | ICD-10-CM

## 2021-09-29 LAB — URINALYSIS, ROUTINE W REFLEX MICROSCOPIC
Bilirubin Urine: NEGATIVE
Glucose, UA: NEGATIVE mg/dL
Hgb urine dipstick: NEGATIVE
Ketones, ur: NEGATIVE mg/dL
Nitrite: NEGATIVE
Protein, ur: NEGATIVE mg/dL
Specific Gravity, Urine: 1.016 (ref 1.005–1.030)
pH: 6 (ref 5.0–8.0)

## 2021-09-29 LAB — FETAL FIBRONECTIN: Fetal Fibronectin: NEGATIVE

## 2021-09-29 MED ORDER — FLUCONAZOLE 150 MG PO TABS
150.0000 mg | ORAL_TABLET | Freq: Once | ORAL | Status: AC
  Administered 2021-09-29: 150 mg via ORAL
  Filled 2021-09-29: qty 1

## 2021-09-29 NOTE — MAU Provider Note (Signed)
History     CSN: [id number]  Arrival date and time: 09/29/21 1618   Event Date/Time   First Provider Initiated Contact with Patient 09/29/21 1710      Chief Complaint  Patient presents with   Contractions   HPI  [name] is a 25 y.o. female [redacted]w[redacted]d @ G1P0000 here with contractions that started at 1400 today and lasted 2 hours. She reports sharp, strong, contraction like pain at the top of her uterus. 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 She reports not drinking enough water today. She reports resolution of contraction pain after drinking a large cup of water. + fetal movement.  No bleeding. Rating contraction pain 2/10 at this time.   Recent + trich. Is finishing flagyl for treatment.   OB History     Gravida  1   Para  0   Term  0   Preterm  0   AB  0   Living  0      SAB  0   IAB  0   Ectopic  0   Multiple  0   Live Births  0           Past Medical History:  Diagnosis Date   Hyperlipidemia     Past Surgical History:  Procedure Laterality Date   WISDOM TOOTH EXTRACTION      Family History  Problem Relation Age of Onset   Hyperlipidemia Mother     Social History   Tobacco Use   Smoking status: Never   Smokeless tobacco: Never  Vaping Use   Vaping Use: Never used  Substance Use Topics   Alcohol use: No   Drug use: No    Allergies: No Known Allergies  Medications Prior to Admission  Medication Sig Dispense Refill Last Dose   metroNIDAZOLE (FLAGYL) 500 MG tablet Take 500 mg by mouth 2 (two) times daily.   09/29/2021   multivitamin (VIT W/EXTRA C) CHEW chewable tablet Chew 2 tablets by mouth daily.   09/28/2021   Blood Pressure Monitor MISC For regular home bp monitoring during pregnancy 1 each 0    folic acid (FOLVITE) 1 MG tablet Take 1 mg by mouth daily.      Results for orders placed or performed during the hospital encounter of 09/29/21 (from the past 48 hour(s))  Urinalysis, Routine w reflex  microscopic Urine, Clean Catch     Status: Abnormal   Collection Time: 09/29/21  4:22 PM  Result Value Ref Range   Color, Urine YELLOW YELLOW   APPearance HAZY (A) CLEAR   Specific Gravity, Urine 1.016 1.005 - 1.030   pH 6.0 5.0 - 8.0   Glucose, UA NEGATIVE NEGATIVE mg/dL   Hgb urine dipstick NEGATIVE NEGATIVE   Bilirubin Urine NEGATIVE NEGATIVE   Ketones, ur NEGATIVE NEGATIVE mg/dL   Protein, ur NEGATIVE NEGATIVE mg/dL   Nitrite NEGATIVE NEGATIVE   Leukocytes,Ua LARGE (A) NEGATIVE   RBC / HPF 0-5 0 - 5 RBC/hpf   WBC, UA 6-10 0 - 5 WBC/hpf   Bacteria, UA RARE (A) NONE SEEN   Squamous Epithelial / LPF 0-5 0 - 5   Mucus PRESENT     Comment: Performed at [facility], [address] [place], [place] [address]  Fetal fibronectin     Status: None   Collection Time: 09/29/21  5:22 PM  Result Value Ref Range   Fetal Fibronectin NEGATIVE NEGATIVE    Comment: Performed at [facility]  [facility], [address] [place], [place] [address]    Review of Systems  Gastrointestinal:  Negative for abdominal pain.  Genitourinary:  Negative for dysuria and vaginal bleeding.  Physical Exam   Blood pressure 120/74, pulse 94, temperature 98.5 F (36.9 C), temperature source Oral, resp. rate 15, last menstrual period 01/30/2021.  Physical Exam Constitutional:      General: She is not in acute distress.    Appearance: Normal appearance. She is not ill-appearing, toxic-appearing or diaphoretic.  HENT:     Head: Normocephalic.  Abdominal:     Palpations: Abdomen is soft.     Tenderness: There is no abdominal tenderness.  Genitourinary:    Comments: Dilation: 1 Effacement (%): 70 Station: -3 FFN collected  Exam by:: Venia Carbon, NP  Skin:    General: Skin is warm.  Neurological:     Mental Status: She is alert and oriented to person, place, and time.   Fetal Tracing: Baseline: 135 bpm Variability: Moderate  Accelerations: 15x15 Decelerations: None Toco: Occasional  MAU Course   Procedures  MDM  Oral fluid hydration done. Patient drank 2 pitcher of water FFN negative Yeast noted on vulva, she did not complete terazol because she felt like it made her discharge worse. Diflucan 150 mg given PO She needs TOC in 3 weeks, she is aware Reports 0/10 pain at the time of discharge.   Assessment and Plan   A:  1. Threatened premature labor, antepartum   2. [redacted] weeks gestation of pregnancy      P:  Discharge home in stable condition Preterm labor precautions Increase oral fluid intake Return to MAU if symptoms worsen  Tieler Cournoyer, Harolyn Rutherford, NP 09/29/2021 7:15 PM

## 2021-09-29 NOTE — MAU Note (Signed)
.  Catherine Park is a 25 y.o. at [redacted]w[redacted]d here in MAU reporting: upper abdominal cramping that started around 1330. States it feels like she is having ctx. Denies VB or LOF. Currently being treated for trich. Endorses good fetal movement.   Pain score: 7 Vitals:   09/29/21 1638  BP: 125/72  Pulse: 91  Resp: 15  Temp: 98.5 F (36.9 C)     FHT:151 Lab orders placed from triage:  UA

## 2021-10-01 LAB — CULTURE, OB URINE

## 2021-10-06 ENCOUNTER — Encounter: Payer: Self-pay | Admitting: Women's Health

## 2021-10-06 ENCOUNTER — Ambulatory Visit (INDEPENDENT_AMBULATORY_CARE_PROVIDER_SITE_OTHER): Payer: Medicaid Other | Admitting: Women's Health

## 2021-10-06 ENCOUNTER — Other Ambulatory Visit: Payer: Self-pay

## 2021-10-06 VITALS — BP 116/68 | HR 99 | Wt 190.0 lb

## 2021-10-06 DIAGNOSIS — Z331 Pregnant state, incidental: Secondary | ICD-10-CM

## 2021-10-06 DIAGNOSIS — Z1389 Encounter for screening for other disorder: Secondary | ICD-10-CM

## 2021-10-06 DIAGNOSIS — A599 Trichomoniasis, unspecified: Secondary | ICD-10-CM | POA: Insufficient documentation

## 2021-10-06 DIAGNOSIS — Z3403 Encounter for supervision of normal first pregnancy, third trimester: Secondary | ICD-10-CM

## 2021-10-06 LAB — POCT URINALYSIS DIPSTICK OB
Blood, UA: NEGATIVE
Glucose, UA: NEGATIVE
Ketones, UA: NEGATIVE
Nitrite, UA: NEGATIVE

## 2021-10-06 NOTE — Patient Instructions (Signed)
Catherine Park, thank you for choosing our office today! We appreciate the opportunity to meet your healthcare needs. You may receive a short survey by mail, e-mail, or through MyChart. If you are happy with your care we would appreciate if you could take just a few minutes to complete the survey questions. We read all of your comments and take your feedback very seriously. Thank you again for choosing our office.  [facility] ([address] [place], [place] [address]) Entrance C, located off of [address] Free 24/7 valet parking   CLASSES: Go to [email or web address] to register for classes (childbirth, breastfeeding, waterbirth, infant CPR, daddy bootcamp, etc.)  Call the office ([phone]) or go to [facility] if: You begin to have strong, frequent contractions Your water breaks.  Sometimes it is a big gush of fluid, sometimes it is just a trickle that keeps getting your panties wet or running down your legs You have vaginal bleeding.  It is normal to have a small amount of spotting if your cervix was checked.  You don't feel your baby moving like normal.  If you don't, get you something to eat and drink and lay down and focus on feeling your baby move.   If your baby is still not moving like normal, you should call the office or go to [facility].  Call the office ([phone]) or go to [facility] for these signs of pre-eclampsia: Severe headache that does not go away with Tylenol Visual changes- seeing spots, double, blurred vision Pain under your right breast or upper abdomen that does not go away with Tums or heartburn medicine Nausea and/or vomiting Severe swelling in your hands, feet, and face   Tdap Vaccine It is recommended that you get the Tdap vaccine during the third trimester of EACH pregnancy to help protect your baby from getting pertussis (whooping cough) 27-36 weeks is the BEST time to do  this so that you can pass the protection on to your baby. During pregnancy is better than after pregnancy, but if you are unable to get it during pregnancy it will be offered at the hospital.  You can get this vaccine with us, at the health department, your family doctor, or some local pharmacies Everyone who will be around your baby should also be up-to-date on their vaccines before the baby comes. Adults (who are not pregnant) only need 1 dose of Tdap during adulthood.   [place] [facility]/Family Doctors [facility] ([facility]): [address], [phone]           [facility]: [address], [phone]                [facility] ([facility]): [address], [phone] (call to ask if accepting patients) [facility]: [address], [place], [phone]    [place] [facility]/Family Doctors [facility] ([facility]): [address], [phone] [facility]: [address], [phone] [facility]: [address], [phone]  [place] [facility]  [facility] ([facility]): [phone] [facility]: [address], [phone]   [place] [facility]: [address], [phone]  [place] [facility]  [facility]: [address], [phone]  Home Blood Pressure Monitoring for Patients   Your provider has recommended that you check your   blood pressure (BP) at least once a week at home. If you do not have a blood pressure cuff at home, one will be provided for you. Contact your provider if you have not received your monitor within 1 week.   Helpful Tips for Accurate Home Blood Pressure Checks  Don't smoke, exercise, or drink caffeine 30 minutes before checking your BP Use the restroom before checking your BP (a full bladder can raise your  pressure) Relax in a comfortable upright chair Feet on the ground Left arm resting comfortably on a flat surface at the level of your heart Legs uncrossed Back supported Sit quietly and don't talk Place the cuff on your bare arm Adjust snuggly, so that only two fingertips can fit between your skin and the top of the cuff Check 2 readings separated by at least one minute Keep a log of your BP readings For a visual, please reference this diagram: http://ccnc.care/bpdiagram  Provider Name: [facility]     Phone: [phone]  Zone 1: ALL CLEAR  Continue to monitor your symptoms:  BP reading is less than 140 (top number) or less than 90 (bottom number)  No right upper stomach pain No headaches or seeing spots No feeling nauseated or throwing up No swelling in face and hands  Zone 2: CAUTION Call your doctor's office for any of the following:  BP reading is greater than 140 (top number) or greater than 90 (bottom number)  Stomach pain under your ribs in the middle or right side Headaches or seeing spots Feeling nauseated or throwing up Swelling in face and hands  Zone 3: EMERGENCY  Seek immediate medical care if you have any of the following:  BP reading is greater than160 (top number) or greater than 110 (bottom number) Severe headaches not improving with Tylenol Serious difficulty catching your breath Any worsening symptoms from Zone 2  Preterm Labor and Birth Information  The normal length of a pregnancy is 39-41 weeks. Preterm labor is when labor starts before 37 completed weeks of pregnancy. What are the risk factors for preterm labor? Preterm labor is more likely to occur in women who: Have certain infections during pregnancy such as a bladder infection, sexually transmitted infection, or infection inside the uterus (chorioamnionitis). Have a shorter-than-normal cervix. Have gone into preterm labor before. Have had surgery on their cervix. Are younger than age 17  or older than age 35. Are African American. Are pregnant with twins or multiple babies (multiple gestation). Take street drugs or smoke while pregnant. Do not gain enough weight while pregnant. Became pregnant shortly after having been pregnant. What are the symptoms of preterm labor? Symptoms of preterm labor include: Cramps similar to those that can happen during a menstrual period. The cramps may happen with diarrhea. Pain in the abdomen or lower back. Regular uterine contractions that may feel like tightening of the abdomen. A feeling of increased pressure in the pelvis. Increased watery or bloody mucus discharge from the vagina. Water breaking (ruptured amniotic sac). Why is it important to recognize signs of preterm labor? It is important to recognize signs of preterm labor because babies who are born prematurely may not be fully developed. This can put them at an increased risk for: Long-term (chronic) heart and lung problems. Difficulty immediately after birth with regulating body systems, including blood sugar, body temperature, heart rate, and breathing rate. Bleeding in the brain. Cerebral palsy. Learning difficulties. Death. These risks are highest for babies who are born before 34 weeks   of pregnancy. How is preterm labor treated? Treatment depends on the length of your pregnancy, your condition, and the health of your baby. It may involve: Having a stitch (suture) placed in your cervix to prevent your cervix from opening too early (cerclage). Taking or being given medicines, such as: Hormone medicines. These may be given early in pregnancy to help support the pregnancy. Medicine to stop contractions. Medicines to help mature the baby's lungs. These may be prescribed if the risk of delivery is high. Medicines to prevent your baby from developing cerebral palsy. If the labor happens before 34 weeks of pregnancy, you may need to stay in the hospital. What should I do if I  think I am in preterm labor? If you think that you are going into preterm labor, call your health care provider right away. How can I prevent preterm labor in future pregnancies? To increase your chance of having a full-term pregnancy: Do not use any tobacco products, such as cigarettes, chewing tobacco, and e-cigarettes. If you need help quitting, ask your health care provider. Do not use street drugs or medicines that have not been prescribed to you during your pregnancy. Talk with your health care provider before taking any herbal supplements, even if you have been taking them regularly. Make sure you gain a healthy amount of weight during your pregnancy. Watch for infection. If you think that you might have an infection, get it checked right away. Make sure to tell your health care provider if you have gone into preterm labor before. This information is not intended to replace advice given to you by your health care provider. Make sure you discuss any questions you have with your health care provider. Document Revised: 04/03/2019 Document Reviewed: 05/02/2016 Elsevier Patient Education  2020 Elsevier Inc.   

## 2021-10-06 NOTE — Progress Notes (Signed)
LOW-RISK PREGNANCY VISIT Patient name: Catherine Park MRN [id number]  Date of birth: 06-10-1996 Chief Complaint:   Routine Prenatal Visit  History of Present Illness:   Catherine Park is a 25 y.o. G14P0000 female at [redacted]w[redacted]d with an Estimated Date of Delivery: 11/13/21 being seen today for ongoing management of a low-risk pregnancy.   Today she reports no complaints. Went to [facility] last week w/ uc's. Just finished up flagyl for trich last Tues. Hasn't had sex yet.  Contractions: Not present.  .  Movement: Present. denies leaking of fluid.  Depression screen [facility] 2/9 08/17/2021 05/10/2021 10/07/2019  Decreased Interest 0 0 0  Down, Depressed, Hopeless 0 0 0  PHQ - 2 Score 0 0 0  Altered sleeping 1 1 -  Tired, decreased energy 1 0 -  Change in appetite 1 1 -  Feeling bad or failure about yourself  0 0 -  Trouble concentrating 1 1 -  Moving slowly or fidgety/restless 0 0 -  Suicidal thoughts 0 0 -  PHQ-9 Score 4 3 -     GAD 7 : Generalized Anxiety Score 08/17/2021 05/10/2021  Nervous, Anxious, on Edge 0 1  Control/stop worrying 0 1  Worry too much - different things 0 1  Trouble relaxing 0 0  Restless 0 1  Easily annoyed or irritable 0 1  Afraid - awful might happen 0 0  Total GAD 7 Score 0 5      Review of Systems:   Pertinent items are noted in HPI Denies abnormal vaginal discharge w/ itching/odor/irritation, headaches, visual changes, shortness of breath, chest pain, abdominal pain, severe nausea/vomiting, or problems with urination or bowel movements unless otherwise stated above. Pertinent History Reviewed:  Reviewed past medical,surgical, social, obstetrical and family history.  Reviewed problem list, medications and allergies. Physical Assessment:   Vitals:   10/06/21 1204  BP: 116/68  Pulse: 99  Weight: 190 lb (86.2 kg)  Body mass index is 34.75 kg/m.        Physical Examination:   General appearance: Well appearing, and in no distress  Mental  status: Alert, oriented to person, place, and time  Skin: Warm & dry  Cardiovascular: Normal heart rate noted  Respiratory: Normal respiratory effort, no distress  Abdomen: Soft, gravid, nontender  Pelvic: Cervical exam deferred         Extremities: Edema: None  Fetal Status: Fetal Heart Rate (bpm): 142 Fundal Height: 32 cm Movement: Present    Chaperone: N/A   Results for orders placed or performed in visit on 10/06/21 (from the past 24 hour(s))  POC Urinalysis Dipstick OB   Collection Time: 10/06/21 12:10 PM  Result Value Ref Range   Color, UA     Clarity, UA     Glucose, UA Negative Negative   Bilirubin, UA     Ketones, UA neg    Spec Grav, UA     Blood, UA neg    pH, UA     POC,PROTEIN,UA Trace Negative, Trace, Small (1+), Moderate (2+), Large (3+), 4+   Urobilinogen, UA     Nitrite, UA neg    Leukocytes, UA Moderate (2+) (A) Negative   Appearance     Odor      Assessment & Plan:  1) Low-risk pregnancy G1P0000 at [redacted]w[redacted]d with an Estimated Date of Delivery: 11/13/21   2) Recent trichomonas, POC next visit   Meds: No orders of the defined types were placed in this encounter.  Labs/procedures today: none  Plan:  Continue routine obstetrical care  Next visit: prefers will be in person for cultures     Reviewed: Preterm labor symptoms and general obstetric precautions including but not limited to vaginal bleeding, contractions, leaking of fluid and fetal movement were reviewed in detail with the patient.  All questions were answered. Does have home bp cuff. Office bp cuff given: not applicable. Check bp weekly, let us know if consistently >140 and/or >90.  Follow-up: Return in about 2 weeks (around 10/20/2021) for LROB, CNM, in person.  No future appointments.  Orders Placed This Encounter  Procedures   POC Urinalysis Dipstick OB   Cheral Marker CNM, [facility] 10/06/2021 12:25 PM

## 2021-10-20 ENCOUNTER — Encounter: Payer: Medicaid Other | Admitting: Women's Health

## 2021-10-24 DIAGNOSIS — Z419 Encounter for procedure for purposes other than remedying health state, unspecified: Secondary | ICD-10-CM | POA: Diagnosis not present

## 2021-10-27 ENCOUNTER — Other Ambulatory Visit: Payer: Self-pay

## 2021-10-27 ENCOUNTER — Ambulatory Visit (INDEPENDENT_AMBULATORY_CARE_PROVIDER_SITE_OTHER): Payer: Medicaid Other | Admitting: Advanced Practice Midwife

## 2021-10-27 ENCOUNTER — Other Ambulatory Visit (HOSPITAL_COMMUNITY)
Admission: RE | Admit: 2021-10-27 | Discharge: 2021-10-27 | Disposition: A | Discharge status: Home / Self Care | Payer: Medicaid Other | Source: Ambulatory Visit | Attending: Women's Health | Admitting: Women's Health

## 2021-10-27 VITALS — BP 116/78 | HR 91 | Wt 194.0 lb

## 2021-10-27 DIAGNOSIS — Z3A37 37 weeks gestation of pregnancy: Secondary | ICD-10-CM | POA: Insufficient documentation

## 2021-10-27 DIAGNOSIS — A599 Trichomoniasis, unspecified: Secondary | ICD-10-CM

## 2021-10-27 DIAGNOSIS — Z3403 Encounter for supervision of normal first pregnancy, third trimester: Secondary | ICD-10-CM | POA: Insufficient documentation

## 2021-10-27 LAB — OB RESULTS CONSOLE GC/CHLAMYDIA: Gonorrhea: NEGATIVE

## 2021-10-27 NOTE — Progress Notes (Signed)
   LOW-RISK PREGNANCY VISIT Patient name: Catherine Park MRN [id number]  Date of birth: 10-25-1996 Chief Complaint:   Routine Prenatal Visit  History of Present Illness:   Diesha Rostad is a 25 y.o. G78P0000 female at [redacted]w[redacted]d with an Estimated Date of Delivery: 11/13/21 being seen today for ongoing management of a low-risk pregnancy.  Today she reports  intermittent ctx . Contractions: Not present. Vag. Bleeding: None.  Movement: Present. denies leaking of fluid. Review of Systems:   Pertinent items are noted in HPI Denies abnormal vaginal discharge w/ itching/odor/irritation, headaches, visual changes, shortness of breath, chest pain, abdominal pain, severe nausea/vomiting, or problems with urination or bowel movements unless otherwise stated above. Pertinent History Reviewed:  Reviewed past medical,surgical, social, obstetrical and family history.  Reviewed problem list, medications and allergies. Physical Assessment:   Vitals:   10/27/21 1120  BP: 116/78  Pulse: 91  Weight: 194 lb (88 kg)  Body mass index is 35.48 kg/m.        Physical Examination:   General appearance: Well appearing, and in no distress  Mental status: Alert, oriented to person, place, and time  Skin: Warm & dry  Cardiovascular: Normal heart rate noted  Respiratory: Normal respiratory effort, no distress  Abdomen: Soft, gravid, nontender  Pelvic: Cervical exam performed  Dilation: 2 Effacement (%): 50 Station: -2  Extremities: Edema: None  Fetal Status: Fetal Heart Rate (bpm): 158 Fundal Height: 37 cm Movement: Present Presentation: Vertex  No results found for this or any previous visit (from the past 24 hour(s)).  Assessment & Plan:  1) Low-risk pregnancy G1P0000 at [redacted]w[redacted]d with an Estimated Date of Delivery: 11/13/21   2) Recent +trich 9/16, POC today   Meds: No orders of the defined types were placed in this encounter.  Labs/procedures today: SVE, GBS, CV swab incl trich POC  Plan:   Continue routine obstetrical care   Reviewed: Term labor symptoms and general obstetric precautions including but not limited to vaginal bleeding, contractions, leaking of fluid and fetal movement were reviewed in detail with the patient.  All questions were answered. Has home bp cuff. Check bp weekly, let us know if >140/90.   Follow-up: Return in about 1 week (around 11/03/2021) for LROB, in person.  Orders Placed This Encounter  Procedures   Culture, beta strep (group b only)   Arabella Merles [facility] 10/27/2021 11:37 AM

## 2021-10-30 LAB — CERVICOVAGINAL ANCILLARY ONLY
Chlamydia: NEGATIVE
Comment: NEGATIVE
Comment: NEGATIVE
Comment: NORMAL
Neisseria Gonorrhea: NEGATIVE
Trichomonas: NEGATIVE

## 2021-10-30 LAB — CULTURE, BETA STREP (GROUP B ONLY): Strep Gp B Culture: POSITIVE — AB

## 2021-10-31 ENCOUNTER — Encounter: Payer: Self-pay | Admitting: Advanced Practice Midwife

## 2021-10-31 DIAGNOSIS — B951 Streptococcus, group B, as the cause of diseases classified elsewhere: Secondary | ICD-10-CM | POA: Insufficient documentation

## 2021-11-03 ENCOUNTER — Other Ambulatory Visit: Payer: Self-pay

## 2021-11-03 ENCOUNTER — Encounter (HOSPITAL_COMMUNITY): Payer: Self-pay | Admitting: Family Medicine

## 2021-11-03 ENCOUNTER — Inpatient Hospital Stay (HOSPITAL_COMMUNITY)
Admission: AD | Admit: 2021-11-03 | Discharge: 2021-11-03 | Disposition: A | Discharge status: Home / Self Care | Payer: Medicaid Other | Attending: Family Medicine | Admitting: Family Medicine

## 2021-11-03 DIAGNOSIS — O26893 Other specified pregnancy related conditions, third trimester: Secondary | ICD-10-CM | POA: Diagnosis not present

## 2021-11-03 DIAGNOSIS — Z0371 Encounter for suspected problem with amniotic cavity and membrane ruled out: Secondary | ICD-10-CM

## 2021-11-03 DIAGNOSIS — O4693 Antepartum hemorrhage, unspecified, third trimester: Secondary | ICD-10-CM | POA: Diagnosis not present

## 2021-11-03 DIAGNOSIS — N898 Other specified noninflammatory disorders of vagina: Secondary | ICD-10-CM

## 2021-11-03 DIAGNOSIS — O98813 Other maternal infectious and parasitic diseases complicating pregnancy, third trimester: Secondary | ICD-10-CM | POA: Diagnosis not present

## 2021-11-03 DIAGNOSIS — B951 Streptococcus, group B, as the cause of diseases classified elsewhere: Secondary | ICD-10-CM | POA: Insufficient documentation

## 2021-11-03 DIAGNOSIS — Z3A38 38 weeks gestation of pregnancy: Secondary | ICD-10-CM | POA: Insufficient documentation

## 2021-11-03 LAB — AMNISURE RUPTURE OF MEMBRANE (ROM) NOT AT ARMC: Amnisure ROM: NEGATIVE

## 2021-11-03 NOTE — MAU Provider Note (Signed)
Chief Complaint:  Rupture of Membranes and Vaginal Bleeding   Event Date/Time   First Provider Initiated Contact with Patient 11/03/21 0216     HPI: Catherine Park is a 25 y.o. G1P0000 at 44w4dwho presents to maternity admissions reporting leaking of fluid with some bleeding.  Picture of pad at home looks like light green fluid with small specks of blood. No pain. . She reports good fetal movement, denies urinary symptoms, h/a, dizziness, n/v, diarrhea, constipation or fever/chills.    Vaginal Bleeding The patient's primary symptoms include vaginal bleeding and vaginal discharge. The patient's pertinent negatives include no genital itching, genital lesions, genital odor or pelvic pain. This is a new problem. The current episode started today. The patient is experiencing no pain. She is pregnant. Pertinent negatives include no abdominal pain, back pain, chills, fever, headaches, nausea or vomiting. The vaginal discharge was green, bloody and watery. The vaginal bleeding is spotting. She has been passing clots. She has not been passing tissue. Nothing aggravates the symptoms. She has tried nothing for the symptoms.   RN Note: About 1900 I went to the BR and saw blood when I wiped. About an hour later I had some blood on the pad and ? Fld. I could have started leaking on Weds night but did not pay much attention until I saw the blood on Thurs night.  No pain. Good FM. Was 2cm and 50% last Fri  Past Medical History: Past Medical History:  Diagnosis Date   Hyperlipidemia     Past obstetric history: OB History  Gravida Para Term Preterm AB Living  1 0 0 0 0 0  SAB IAB Ectopic Multiple Live Births  0 0 0 0 0    # Outcome Date GA Lbr Len/2nd Weight Sex Delivery Anes PTL Lv  1 Current             Past Surgical History: Past Surgical History:  Procedure Laterality Date   WISDOM TOOTH EXTRACTION      Family History: Family History  Problem Relation Age of Onset   Hyperlipidemia  Mother     Social History: Social History   Tobacco Use   Smoking status: Never   Smokeless tobacco: Never  Vaping Use   Vaping Use: Never used  Substance Use Topics   Alcohol use: No   Drug use: No    Allergies: No Known Allergies  Meds:  Medications Prior to Admission  Medication Sig Dispense Refill Last Dose   Blood Pressure Monitor MISC For regular home bp monitoring during pregnancy 1 each 0 11/03/2021   folic acid (FOLVITE) 1 MG tablet Take 1 mg by mouth daily.   11/03/2021   multivitamin (VIT W/EXTRA C) CHEW chewable tablet Chew 2 tablets by mouth daily.   11/03/2021   metroNIDAZOLE (FLAGYL) 500 MG tablet Take 500 mg by mouth 2 (two) times daily. (Patient not taking: Reported on 10/27/2021)       I have reviewed patient's Past Medical Hx, Surgical Hx, Family Hx, Social Hx, medications and allergies.   ROS:  Review of Systems  Constitutional:  Negative for chills and fever.  Gastrointestinal:  Negative for abdominal pain, nausea and vomiting.  Genitourinary:  Positive for vaginal bleeding and vaginal discharge. Negative for pelvic pain.  Musculoskeletal:  Negative for back pain.  Neurological:  Negative for headaches.  Other systems negative  Physical Exam  Patient Vitals for the past 24 hrs:  BP Temp Pulse Resp Height Weight  11/03/21 0122 109/68 97.9 F (  36.6 C) 89 16 5\' 2"  (1.575 m) 88 kg   Constitutional: Well-developed, well-nourished female in no acute distress.  Cardiovascular: normal rate and rhythm Respiratory: normal effort, clear to auscultation bilaterally GI: Abd soft, non-tender, gravid appropriate for gestational age.   No rebound or guarding. MS: Extremities nontender, no edema, normal ROM Neurologic: Alert and oriented x 4.  GU: Neg CVAT.  PELVIC EXAM: scant thick white and blood tinged discharge, vaginal walls and external genitalia normal     No pooling.  FHT:  Baseline 120 , moderate variability, accelerations present, no  decelerations Contractions:  Irregular     Labs: Results for orders placed or performed during the hospital encounter of 11/03/21 (from the past 24 hour(s))  Amnisure rupture of membrane (rom)not at [facility]     Status: None   Collection Time: 11/03/21  2:12 AM  Result Value Ref Range   Amnisure ROM NEGATIVE     B/Positive/-- (05/18 1554)  Imaging:  No results found.  MAU Course/MDM: I have ordered Amnisure and reviewed results. It is negative NST reviewed, reactive  Treatments in MAU included EFM, SSE.    Assessment: Single IUP at [redacted]w[redacted]d Vaginal discharge  No evidence of ruptured membranes  Plan: Discharge home Labor precautions and fetal kick counts Follow up in Office for prenatal visits  Encouraged to return if she develops worsening of symptoms, increase in pain, fever, or other concerning symptoms.   Pt stable at time of discharge.  [redacted]w[redacted]d CNM, MSN Certified Nurse-Midwife 11/03/2021 2:16 AM

## 2021-11-03 NOTE — MAU Note (Addendum)
About 1900 I went to the BR and saw blood when I wiped. About an hour later I had some blood on the pad and ? Fld. I could have started leaking on Weds night but did not pay much attention until I saw the blood on Thurs night.  No pain. Good FM. Was 2cm and 50% last Fri

## 2021-11-05 ENCOUNTER — Other Ambulatory Visit: Payer: Self-pay

## 2021-11-05 ENCOUNTER — Encounter (HOSPITAL_COMMUNITY): Payer: Self-pay | Admitting: Family Medicine

## 2021-11-05 ENCOUNTER — Inpatient Hospital Stay (EMERGENCY_DEPARTMENT_HOSPITAL)
Admission: AD | Admit: 2021-11-05 | Discharge: 2021-11-05 | Disposition: A | Discharge status: Home / Self Care | Payer: Medicaid Other | Source: Home / Self Care | Attending: Family Medicine | Admitting: Family Medicine

## 2021-11-05 DIAGNOSIS — O471 False labor at or after 37 completed weeks of gestation: Secondary | ICD-10-CM | POA: Insufficient documentation

## 2021-11-05 DIAGNOSIS — O98813 Other maternal infectious and parasitic diseases complicating pregnancy, third trimester: Secondary | ICD-10-CM | POA: Insufficient documentation

## 2021-11-05 DIAGNOSIS — Z0371 Encounter for suspected problem with amniotic cavity and membrane ruled out: Secondary | ICD-10-CM | POA: Diagnosis not present

## 2021-11-05 DIAGNOSIS — B3731 Acute candidiasis of vulva and vagina: Secondary | ICD-10-CM

## 2021-11-05 DIAGNOSIS — Z3A38 38 weeks gestation of pregnancy: Secondary | ICD-10-CM

## 2021-11-05 LAB — WET PREP, GENITAL
Clue Cells Wet Prep HPF POC: NONE SEEN
Sperm: NONE SEEN
Trich, Wet Prep: NONE SEEN

## 2021-11-05 LAB — POCT FERN TEST: POCT Fern Test: NEGATIVE

## 2021-11-05 MED ORDER — FLUCONAZOLE 150 MG PO TABS: 150.0000 mg | ORAL_TABLET | ORAL | 0 refills | Status: DC

## 2021-11-05 NOTE — MAU Note (Signed)
Catherine Park is a 25 y.o. at [redacted]w[redacted]d here in MAU reporting: this morning when she got up she noticed some fluid. Has continued to see some fluid but states it looks more like mucus then water. Has a yellow tint. No bleeding. +FM. Having irregular contractions.  Onset of complaint: today  Pain score: 2/10  Vitals:   11/05/21 1457  BP: 121/74  Pulse: 87  Resp: 16  Temp: (!) 97.5 F (36.4 C)  SpO2: 97%     FHT:134  Lab orders placed from triage: none

## 2021-11-05 NOTE — MAU Provider Note (Addendum)
S: Catherine Park is a 25 y.o. G1P0000 at [redacted]w[redacted]d  who presents to MAU today complaining of leaking of fluid since this morning. She denies vaginal bleeding. She endorses irregular contractions. She reports normal fetal movement. Her spouse is present and contributing to the history taking.   O: BP 119/84   Pulse 90   Temp 98.6 F (37 C) (Oral)   Resp 16   Ht 5\' 2"  (1.575 m)   Wt 88.3 kg   LMP 01/30/2021 (Exact Date)   SpO2 99%   BMI 35.61 kg/m  GENERAL: Well-developed, well-nourished female in no acute distress.  HEAD: Normocephalic, atraumatic.  CHEST: Normal effort of breathing, regular heart rate ABDOMEN: Soft, nontender, gravid PELVIC: Normal external female genitalia. Vagina is pink and rugated. Cervix with normal contour, no lesions. Clumpy and watery discharge.  Cervical exam:  Dilation: 3 Effacement (%): 80 Station: -1 Exam by:: [email or web address], CNM   Fetal Monitoring: Baseline: 130 Variability: moderate Accelerations: present Decelerations: none Contractions: irregular every 6-10 mins  Results for orders placed or performed during the hospital encounter of 11/05/21 (from the past 24 hour(s))  Wet prep, genital     Status: Abnormal   Collection Time: 11/05/21  3:51 PM  Result Value Ref Range   Yeast Wet Prep HPF POC PRESENT (A) NONE SEEN   Trich, Wet Prep NONE SEEN NONE SEEN   Clue Cells Wet Prep HPF POC NONE SEEN NONE SEEN   WBC, Wet Prep HPF POC MANY (A) NONE SEEN   Sperm NONE SEEN   Fern Test     Status: Normal   Collection Time: 11/05/21  4:12 PM  Result Value Ref Range   POCT Fern Test Negative = intact amniotic membranes     A: SIUP at [redacted]w[redacted]d  Membranes intact No leakage of amniotic fluid into vagina Candida vaginitis [redacted] weeks gestation of pregnancy    P: Rx for Diflucan 150 mg po 1 tablet every 3 days x 3 doses Information provided on vaginal yeast infection and signs of labor Keep scheduled appt tomorrow with [facility] Patient verbalized an  understanding of the plan of care and agrees.    [redacted]w[redacted]d, CNM 11/05/2021, 4:00 PM

## 2021-11-06 ENCOUNTER — Encounter: Payer: Self-pay | Admitting: Obstetrics & Gynecology

## 2021-11-06 ENCOUNTER — Other Ambulatory Visit: Payer: Self-pay

## 2021-11-06 ENCOUNTER — Encounter (HOSPITAL_COMMUNITY): Payer: Self-pay | Admitting: Obstetrics and Gynecology

## 2021-11-06 ENCOUNTER — Ambulatory Visit (INDEPENDENT_AMBULATORY_CARE_PROVIDER_SITE_OTHER): Payer: Medicaid Other | Admitting: Obstetrics & Gynecology

## 2021-11-06 ENCOUNTER — Inpatient Hospital Stay (HOSPITAL_COMMUNITY)
Admission: AD | Admit: 2021-11-06 | Discharge: 2021-11-09 | DRG: 807 | Disposition: A | Discharge status: Home / Self Care | Payer: Medicaid Other | Attending: Family Medicine | Admitting: Family Medicine

## 2021-11-06 VITALS — BP 134/80 | HR 98 | Wt 196.0 lb

## 2021-11-06 DIAGNOSIS — B3731 Acute candidiasis of vulva and vagina: Secondary | ICD-10-CM | POA: Diagnosis not present

## 2021-11-06 DIAGNOSIS — Z20822 Contact with and (suspected) exposure to covid-19: Secondary | ICD-10-CM | POA: Diagnosis not present

## 2021-11-06 DIAGNOSIS — B951 Streptococcus, group B, as the cause of diseases classified elsewhere: Secondary | ICD-10-CM | POA: Diagnosis present

## 2021-11-06 DIAGNOSIS — O99824 Streptococcus B carrier state complicating childbirth: Secondary | ICD-10-CM | POA: Diagnosis not present

## 2021-11-06 DIAGNOSIS — O26893 Other specified pregnancy related conditions, third trimester: Secondary | ICD-10-CM | POA: Diagnosis not present

## 2021-11-06 DIAGNOSIS — Z3403 Encounter for supervision of normal first pregnancy, third trimester: Secondary | ICD-10-CM

## 2021-11-06 DIAGNOSIS — Z3A39 39 weeks gestation of pregnancy: Secondary | ICD-10-CM

## 2021-11-06 DIAGNOSIS — Z3A38 38 weeks gestation of pregnancy: Secondary | ICD-10-CM | POA: Diagnosis not present

## 2021-11-06 DIAGNOSIS — Z0371 Encounter for suspected problem with amniotic cavity and membrane ruled out: Secondary | ICD-10-CM | POA: Diagnosis not present

## 2021-11-06 HISTORY — DX: Other specified health status: Z78.9

## 2021-11-06 LAB — CBC
HCT: 37.3 % (ref 36.0–46.0)
Hemoglobin: 12.5 g/dL (ref 12.0–15.0)
MCH: 28.4 pg (ref 26.0–34.0)
MCHC: 33.5 g/dL (ref 30.0–36.0)
MCV: 84.8 fL (ref 80.0–100.0)
Platelets: 245 10*3/uL (ref 150–400)
RBC: 4.4 MIL/uL (ref 3.87–5.11)
RDW: 13 % (ref 11.5–15.5)
WBC: 17.7 10*3/uL — ABNORMAL HIGH (ref 4.0–10.5)
nRBC: 0 % (ref 0.0–0.2)

## 2021-11-06 LAB — RESP PANEL BY RT-PCR (FLU A&B, COVID) ARPGX2
Influenza A by PCR: NEGATIVE
Influenza B by PCR: NEGATIVE
SARS Coronavirus 2 by RT PCR: NEGATIVE

## 2021-11-06 LAB — TYPE AND SCREEN
ABO/RH(D): B POS
Antibody Screen: NEGATIVE

## 2021-11-06 MED ORDER — FENTANYL CITRATE (PF) 100 MCG/2ML IJ SOLN
50.0000 ug | INTRAMUSCULAR | Status: DC | PRN
  Administered 2021-11-06: 50 ug via INTRAVENOUS
  Administered 2021-11-07 (×2): 100 ug via INTRAVENOUS
  Filled 2021-11-06 (×3): qty 2

## 2021-11-06 MED ORDER — OXYTOCIN-SODIUM CHLORIDE 30-0.9 UT/500ML-% IV SOLN
2.5000 [IU]/h | INTRAVENOUS | Status: DC
  Administered 2021-11-07: 2.5 [IU]/h via INTRAVENOUS
  Filled 2021-11-06: qty 500

## 2021-11-06 MED ORDER — SODIUM CHLORIDE 0.9 % IV SOLN
2.0000 g | Freq: Once | INTRAVENOUS | Status: AC
  Administered 2021-11-06: 2 g via INTRAVENOUS
  Filled 2021-11-06: qty 2000

## 2021-11-06 MED ORDER — OXYCODONE-ACETAMINOPHEN 5-325 MG PO TABS: 2.0000 | ORAL_TABLET | ORAL | Status: DC | PRN

## 2021-11-06 MED ORDER — ONDANSETRON HCL 4 MG/2ML IJ SOLN: 4.0000 mg | Freq: Four times a day (QID) | INTRAMUSCULAR | Status: DC | PRN

## 2021-11-06 MED ORDER — SODIUM CHLORIDE 0.9 % IV SOLN
1.0000 g | INTRAVENOUS | Status: DC
  Administered 2021-11-07 (×2): 1 g via INTRAVENOUS
  Filled 2021-11-06 (×2): qty 1000

## 2021-11-06 MED ORDER — LIDOCAINE HCL (PF) 1 % IJ SOLN: 30.0000 mL | INTRAMUSCULAR | Status: DC | PRN

## 2021-11-06 MED ORDER — OXYCODONE-ACETAMINOPHEN 5-325 MG PO TABS: 1.0000 | ORAL_TABLET | ORAL | Status: DC | PRN

## 2021-11-06 MED ORDER — OXYTOCIN BOLUS FROM INFUSION
333.0000 mL | Freq: Once | INTRAVENOUS | Status: AC
  Administered 2021-11-07: 333 mL via INTRAVENOUS

## 2021-11-06 MED ORDER — ACETAMINOPHEN 325 MG PO TABS: 650.0000 mg | ORAL_TABLET | ORAL | Status: DC | PRN

## 2021-11-06 MED ORDER — LACTATED RINGERS IV SOLN
500.0000 mL | INTRAVENOUS | Status: DC | PRN
  Administered 2021-11-07: 500 mL via INTRAVENOUS

## 2021-11-06 MED ORDER — SOD CITRATE-CITRIC ACID 500-334 MG/5ML PO SOLN: 30.0000 mL | ORAL | Status: DC | PRN

## 2021-11-06 MED ORDER — LACTATED RINGERS IV SOLN: INTRAVENOUS | Status: DC

## 2021-11-06 NOTE — Progress Notes (Signed)
   LOW-RISK PREGNANCY VISIT Patient name: Catherine Park MRN [id number]  Date of birth: March 06, 1996 Chief Complaint:   Routine Prenatal Visit  History of Present Illness:   Catherine Park is a 25 y.o. G43P0000 female at [redacted]w[redacted]d with an Estimated Date of Delivery: 11/13/21 being seen today for ongoing management of a low-risk pregnancy.  Depression screen [facility] 2/9 08/17/2021 05/10/2021 10/07/2019  Decreased Interest 0 0 0  Down, Depressed, Hopeless 0 0 0  PHQ - 2 Score 0 0 0  Altered sleeping 1 1 -  Tired, decreased energy 1 0 -  Change in appetite 1 1 -  Feeling bad or failure about yourself  0 0 -  Trouble concentrating 1 1 -  Moving slowly or fidgety/restless 0 0 -  Suicidal thoughts 0 0 -  PHQ-9 Score 4 3 -    Today she reports no complaints. Contractions: Irritability. Vag. Bleeding: None.  Movement: Present. denies leaking of fluid. Review of Systems:   Pertinent items are noted in HPI Denies abnormal vaginal discharge w/ itching/odor/irritation, headaches, visual changes, shortness of breath, chest pain, abdominal pain, severe nausea/vomiting, or problems with urination or bowel movements unless otherwise stated above. Pertinent History Reviewed:  Reviewed past medical,surgical, social, obstetrical and family history.  Reviewed problem list, medications and allergies. Physical Assessment:   Vitals:   11/06/21 1501  BP: 134/80  Pulse: 98  Weight: 196 lb (88.9 kg)  Body mass index is 35.85 kg/m.        Physical Examination:   General appearance: Well appearing, and in no distress  Mental status: Alert, oriented to person, place, and time  Skin: Warm & dry  Cardiovascular: Normal heart rate noted  Respiratory: Normal respiratory effort, no distress  Abdomen: Soft, gravid, nontender  Pelvic: Cervical exam performed  Dilation: 3 Effacement (%): 80 Station: -2  Extremities: Edema: Trace  Fetal Status: Fetal Heart Rate (bpm): 144 Fundal Height: 36 cm Movement:  Present Presentation: Vertex Membrane sweep performed  Chaperone:  Lawanna Kobus [place]     Results for orders placed or performed during the hospital encounter of 11/05/21 (from the past 24 hour(s))  Wet prep, genital   Collection Time: 11/05/21  3:51 PM  Result Value Ref Range   Yeast Wet Prep HPF POC PRESENT (A) NONE SEEN   Trich, Wet Prep NONE SEEN NONE SEEN   Clue Cells Wet Prep HPF POC NONE SEEN NONE SEEN   WBC, Wet Prep HPF POC MANY (A) NONE SEEN   Sperm NONE SEEN   Fern Test   Collection Time: 11/05/21  4:12 PM  Result Value Ref Range   POCT Fern Test Negative = intact amniotic membranes     Assessment & Plan:  1) Low-risk pregnancy G1P0000 at [redacted]w[redacted]d with an Estimated Date of Delivery: 11/13/21      Meds: No orders of the defined types were placed in this encounter.  Labs/procedures today:   Plan:  Continue routine obstetrical care  Next visit: prefers in person    Reviewed: Term labor symptoms and general obstetric precautions including but not limited to vaginal bleeding, contractions, leaking of fluid and fetal movement were reviewed in detail with the patient.  All questions were answered.  home bp cuff. Rx faxed to . Check bp weekly, let us know if >140/90.   Follow-up: Return in about 1 week (around 11/13/2021) for LROB.  No orders of the defined types were placed in this encounter.   Lazaro Arms, MD 11/06/2021 3:15 PM

## 2021-11-06 NOTE — MAU Note (Signed)
Pt was here yesterday for labor check and went home at 3cm. Was seen in office today and membranes swept but does not remember dilation.  Ctxs since 1730 with bloody show.

## 2021-11-06 NOTE — H&P (Signed)
OBSTETRIC ADMISSION HISTORY AND PHYSICAL  Catherine Park is a 25 y.o. female G1P0000 with IUP at [redacted]w[redacted]d by 8 week [place] presenting for SOL. She reports +FMs, no LOF, no VB, no blurry vision, headaches, or RUQ pain.  She reports mild lower extremity swelling. She plans on breast feeding. She is undecided about birth control postpartum.   She received her prenatal care at [facility].   Dating: By [place] --->  Estimated Date of Delivery: 11/13/21  Sono:   @[redacted]w[redacted]d , normal anatomy, breech presentation, anterior placental lie, 358 g, 49% EFW  Prenatal History/Complications:  GBS positive  Trichomonas infection (neg TOC 10/27/21)   Past Medical History: Past Medical History:  Diagnosis Date   Hyperlipidemia    Medical history non-contributory     Past Surgical History: Past Surgical History:  Procedure Laterality Date   WISDOM TOOTH EXTRACTION      Obstetrical History: OB History     Gravida  1   Para  0   Term  0   Preterm  0   AB  0   Living  0      SAB  0   IAB  0   Ectopic  0   Multiple  0   Live Births  0           Social History Social History   Socioeconomic History   Marital status: Married    Spouse name: Not on file   Number of children: Not on file   Years of education: Not on file   Highest education level: Not on file  Occupational History   Not on file  Tobacco Use   Smoking status: Never   Smokeless tobacco: Never  Vaping Use   Vaping Use: Never used  Substance and Sexual Activity   Alcohol use: No   Drug use: No   Sexual activity: Yes    Birth control/protection: None  Other Topics Concern   Not on file  Social History Narrative   Not on file   Social Determinants of Health   Financial Resource Strain: Low Risk    Difficulty of Paying Living Expenses: Not very hard  Food Insecurity: No Food Insecurity   Worried About Running Out of Food in the Last Year: Never true   Ran Out of Food in the Last Year: Never true   Transportation Needs: No Transportation Needs   Lack of Transportation (Medical): No   Lack of Transportation (Non-Medical): No  Physical Activity: Sufficiently Active   Days of Exercise per Week: 5 days   Minutes of Exercise per Session: 30 min  Stress: No Stress Concern Present   Feeling of Stress : Only a little  Social Connections: Moderately Isolated   Frequency of Communication with Friends and Family: More than three times a week   Frequency of Social Gatherings with Friends and Family: More than three times a week   Attends Religious Services: Never   13/4/22 or Organizations: No   Attends Database administrator: Never   Marital Status: Married    Family History: Family History  Problem Relation Age of Onset   Hyperlipidemia Mother     Allergies: No Known Allergies  Medications Prior to Admission  Medication Sig Dispense Refill Last Dose   folic acid (FOLVITE) 1 MG tablet Take 1 mg by mouth daily.   11/05/2021   multivitamin (VIT W/EXTRA C) CHEW chewable tablet Chew 2 tablets by mouth daily.   11/05/2021   Blood  Pressure Monitor MISC For regular home bp monitoring during pregnancy 1 each 0    fluconazole (DIFLUCAN) 150 MG tablet Take 1 tablet (150 mg total) by mouth every 3 (three) days. 3 tablet 0      Review of Systems  All systems reviewed and negative except as stated in HPI  Blood pressure 122/80, pulse 85, temperature 97.9 F (36.6 C), resp. rate 17, height 5\' 2"  (1.575 m), weight 88 kg, last menstrual period 01/30/2021, SpO2 100 %.  General appearance: alert, cooperative, and no distress Lungs: normal work of breathing on room air Heart: normal rate, warm and well perfused  Abdomen: soft, non-tender, gravid Extremities: trace lower extremity edema bilaterally, no calf tenderness to palpation  Presentation:  Cephalic per RN Fetal monitoring: Baseline 135 bpm, moderate variability, + accels, no decels Uterine activity: Every 2-4  minutes Dilation: 5 Effacement (%): 90 Station: -2 Exam by:: weston,rn   Prenatal labs: ABO, Rh: B/Positive/-- (05/18 1554) Antibody: Negative (08/25 0825) Rubella: 2.16 (05/18 1554) RPR: Non Reactive (08/25 0825)  HBsAg: Negative (05/18 1554)  HIV: Non Reactive (08/25 0825)  GBS: Positive/-- (11/04 1346)  2 hr Glucola normal  Genetic screening - LR NIPS Anatomy 05-23-1971 normal   Prenatal Transfer Tool  Maternal Diabetes: No Genetic Screening: Normal Maternal Ultrasounds/Referrals: Normal Fetal Ultrasounds or other Referrals:  None Maternal Substance Abuse:  No Significant Maternal Medications:  None Significant Maternal Lab Results: Group B Strep positive  No results found for this or any previous visit (from the past 24 hour(s)).  Patient Active Problem List   Diagnosis Date Noted   Positive testing for group B Streptococcus 10/31/2021   Trichomonas infection 10/06/2021   Supervision of normal first pregnancy 05/09/2021    Assessment/Plan:  Catherine Park is a 25 y.o. G1P0000 at [redacted]w[redacted]d here for SOL.  #Labor: Progressing well. Continue expectant management. Plan for AROM once patient receives adqaute GBS prophylaxis. Pitocin for augmentation as needed.  #Pain: PRN #FWB: Cat 1  #ID:  GBS pos > Amp  #MOF: Breast #MOC: Undecided  [redacted]w[redacted]d, MD  11/06/2021, 9:26 PM

## 2021-11-07 ENCOUNTER — Encounter (HOSPITAL_COMMUNITY): Payer: Self-pay | Admitting: Obstetrics and Gynecology

## 2021-11-07 DIAGNOSIS — O99824 Streptococcus B carrier state complicating childbirth: Secondary | ICD-10-CM | POA: Diagnosis not present

## 2021-11-07 DIAGNOSIS — Z3A39 39 weeks gestation of pregnancy: Secondary | ICD-10-CM | POA: Diagnosis not present

## 2021-11-07 LAB — RPR: RPR Ser Ql: NONREACTIVE

## 2021-11-07 MED ORDER — FLUCONAZOLE 150 MG PO TABS
150.0000 mg | ORAL_TABLET | Freq: Once | ORAL | Status: AC
  Administered 2021-11-07: 150 mg via ORAL
  Filled 2021-11-07: qty 1

## 2021-11-07 MED ORDER — FERROUS SULFATE 325 (65 FE) MG PO TABS
325.0000 mg | ORAL_TABLET | ORAL | Status: DC
  Administered 2021-11-07 – 2021-11-09 (×2): 325 mg via ORAL
  Filled 2021-11-07 (×2): qty 1

## 2021-11-07 MED ORDER — SENNOSIDES-DOCUSATE SODIUM 8.6-50 MG PO TABS
2.0000 | ORAL_TABLET | Freq: Every day | ORAL | Status: DC
  Administered 2021-11-08 – 2021-11-09 (×2): 2 via ORAL
  Filled 2021-11-07 (×2): qty 2

## 2021-11-07 MED ORDER — SIMETHICONE 80 MG PO CHEW: 80.0000 mg | CHEWABLE_TABLET | ORAL | Status: DC | PRN

## 2021-11-07 MED ORDER — COCONUT OIL OIL: 1.0000 "application " | TOPICAL_OIL | Status: DC | PRN

## 2021-11-07 MED ORDER — DIBUCAINE (PERIANAL) 1 % EX OINT: 1.0000 "application " | TOPICAL_OINTMENT | CUTANEOUS | Status: DC | PRN

## 2021-11-07 MED ORDER — FLUCONAZOLE 150 MG PO TABS: 150.0000 mg | ORAL_TABLET | ORAL | Status: DC

## 2021-11-07 MED ORDER — PRENATAL MULTIVITAMIN CH
1.0000 | ORAL_TABLET | Freq: Every day | ORAL | Status: DC
  Administered 2021-11-07 – 2021-11-09 (×3): 1 via ORAL
  Filled 2021-11-07 (×3): qty 1

## 2021-11-07 MED ORDER — BENZOCAINE-MENTHOL 20-0.5 % EX AERO: 1.0000 "application " | INHALATION_SPRAY | CUTANEOUS | Status: DC | PRN

## 2021-11-07 MED ORDER — WITCH HAZEL-GLYCERIN EX PADS: 1.0000 "application " | MEDICATED_PAD | CUTANEOUS | Status: DC | PRN

## 2021-11-07 MED ORDER — DIPHENHYDRAMINE HCL 25 MG PO CAPS: 25.0000 mg | ORAL_CAPSULE | Freq: Four times a day (QID) | ORAL | Status: DC | PRN

## 2021-11-07 MED ORDER — IBUPROFEN 600 MG PO TABS
600.0000 mg | ORAL_TABLET | Freq: Four times a day (QID) | ORAL | Status: DC
  Administered 2021-11-07 – 2021-11-09 (×9): 600 mg via ORAL
  Filled 2021-11-07 (×9): qty 1

## 2021-11-07 MED ORDER — ACETAMINOPHEN 325 MG PO TABS: 650.0000 mg | ORAL_TABLET | ORAL | Status: DC | PRN

## 2021-11-07 MED ORDER — TETANUS-DIPHTH-ACELL PERTUSSIS 5-2.5-18.5 LF-MCG/0.5 IM SUSY: 0.5000 mL | PREFILLED_SYRINGE | Freq: Once | INTRAMUSCULAR | Status: DC

## 2021-11-07 MED ORDER — MEDROXYPROGESTERONE ACETATE 150 MG/ML IM SUSP: 150.0000 mg | INTRAMUSCULAR | Status: DC | PRN

## 2021-11-07 MED ORDER — MEASLES, MUMPS & RUBELLA VAC IJ SOLR: 0.5000 mL | Freq: Once | INTRAMUSCULAR | Status: DC

## 2021-11-07 MED ORDER — ONDANSETRON HCL 4 MG/2ML IJ SOLN: 4.0000 mg | INTRAMUSCULAR | Status: DC | PRN

## 2021-11-07 MED ORDER — ONDANSETRON HCL 4 MG PO TABS: 4.0000 mg | ORAL_TABLET | ORAL | Status: DC | PRN

## 2021-11-07 NOTE — Progress Notes (Signed)
Labor Progress Note Catherine Park is a 25 y.o. G1P0000 at [redacted]w[redacted]d who presented for SOL.   S: Doing well. On birthing ball. Breathing through contractions. No concerns.   O:  BP 124/84   Pulse 92   Temp 98 F (36.7 C) (Oral)   Resp 18   Ht 5\' 2"  (1.575 m)   Wt 88 kg   LMP 01/30/2021 (Exact Date)   SpO2 100%   BMI 35.48 kg/m   EFM: Baseline 135 bpm, moderate variability, + accels, early decels   CVE: Dilation: 7 Effacement (%): 90 Cervical Position: Middle Station: -1 Presentation: Vertex Exam by:: Dr. [email or web address]   A&P: 25 y.o. G1P0000 [redacted]w[redacted]d   #Labor: Progressing well. AROM performed this check with clear fluid. Mom and baby tolerated this well, fetal head well applied. Contracting well on her own. Continue expectant management. Will add Pitocin as needed.  #Pain: PRN; IV Fentanyl currently  #FWB: Cat 1  #GBS positive > Amp #Anticipated MOD: SVD  [redacted]w[redacted]d, MD 5:10 AM

## 2021-11-07 NOTE — Lactation Note (Signed)
This note was copied from a baby's chart. Lactation Consultation Note  Patient Name: Girl Janesia Joswick [id number] Date: 11/07/2021 Reason for consult: L&D Initial assessment YNW:[id number] 25 mins of age. Staff nurse phone to come to assist with latching infant.  Infant crying .  Assist mother with hand expression. Infant refused breast miliple times. She has a high palate. Infant placed prone and unable to sustain latch. Infant refused to suck on gloved finger.  Infant was fed 1.5 ml of colostrum with spoon. Infant placed STS on mothers chest and became content.  Mother was assured that she would have assistance with more lactation help when she gets to her room.   Maternal Data Has patient been taught Hand Expression?: Yes Does the patient have breastfeeding experience prior to this delivery?: No  Feeding Mother's Current Feeding Choice: Breast Milk  LATCH Score Latch: Too sleepy or reluctant, no latch achieved, no sucking elicited.  Audible Swallowing: None  Type of Nipple: Everted at rest and after stimulation  Comfort (Breast/Nipple): Soft / non-tender  Hold (Positioning): Full assist, staff holds infant at breast  LATCH Score: 4   Lactation Tools Discussed/Used    Interventions Interventions: Breast feeding basics reviewed;Assisted with latch;Skin to skin;Hand express;Support pillows;Adjust position  Discharge    Consult Status Consult Status: Follow-up from L&D    Stevan Born [facility] 11/07/2021, 9:45 AM

## 2021-11-07 NOTE — Discharge Summary (Addendum)
Postpartum Discharge Summary     Patient Name: Catherine Park DOB: 21-Dec-1996 MRN: [id number]  Date of admission: 11/06/2021 Delivery date:11/07/2021  Delivering provider: Araceli Bouche  Date of discharge: 11/09/2021  Admitting diagnosis: Indication for care in labor or delivery [O75.9] Intrauterine pregnancy: [redacted]w[redacted]d    Secondary diagnosis:  Active Problems:   Positive testing for group B Streptococcus   Indication for care in labor or delivery  Additional problems: none    Discharge diagnosis: Term Pregnancy Delivered                                              Post partum procedures: none Augmentation: AROM Complications: None  Hospital course: Onset of Labor With Vaginal Delivery      25y.o. yo G1P0000 at 333w1das admitted in Latent Labor on 11/06/2021. Patient had an uncomplicated labor course as follows:  Membrane Rupture Time/Date: 5:06 AM ,11/07/2021   Delivery Method:Vaginal, Spontaneous  Episiotomy: None  Lacerations:  Vaginal  Patient had an uncomplicated postpartum course.  She is ambulating, tolerating a regular diet, passing flatus, and urinating well. Patient is discharged home in stable condition on 11/09/21.  Newborn Data: Birth date:11/07/2021  Birth time:8:47 AM  Gender:Female  Living status:Living  Apgars:9 ,10  Weight:3399 g (7lb 7.9oz)  Magnesium Sulfate received: No BMZ received: No Rhophylac:N/A MMR:N/A T-DaP:Given prenatally Flu: No Transfusion:No  Physical exam  Vitals:   11/08/21 0456 11/08/21 1513 11/08/21 2130 11/09/21 0515  BP: 115/76 114/80 121/75 111/76  Pulse: 79 77 78 73  Resp: 17 16 18 18   Temp: 98.1 F (36.7 C) 97.8 F (36.6 C) 98.8 F (37.1 C) 98.7 F (37.1 C)  TempSrc:  Oral Oral Oral  SpO2: 98% 98% 100% 100%  Weight:      Height:       General: alert, cooperative, and no distress Lochia: appropriate Uterine Fundus: firm DVT Evaluation: No evidence of DVT seen on physical exam. No cords or calf  tenderness. No significant calf/ankle edema. Labs: Lab Results  Component Value Date   WBC 17.7 (H) 11/06/2021   HGB 12.5 11/06/2021   HCT 37.3 11/06/2021   MCV 84.8 11/06/2021   PLT 245 11/06/2021   CMP Latest Ref Rng & Units 06/16/2021  Glucose 70 - 99 mg/dL 115(H)  BUN 6 - 20 mg/dL 6  Creatinine 0.44 - 1.00 mg/dL 0.39(L)  Sodium 135 - 145 mmol/L 135  Potassium 3.5 - 5.1 mmol/L 3.5  Chloride 98 - 111 mmol/L 104  CO2 22 - 32 mmol/L 23  Calcium 8.9 - 10.3 mg/dL 9.0  Total Protein 6.0 - 8.3 g/dL -  Total Bilirubin 0.2 - 1.1 mg/dL -  Alkaline Phos 47 - 119 U/L -  AST 0 - 37 U/L -  ALT 0 - 35 U/L -   Edinburgh Score: Edinburgh Postnatal Depression Scale Screening Tool 11/07/2021  I have been able to laugh and see the funny side of things. 0  I have looked forward with enjoyment to things. 0  I have blamed myself unnecessarily when things went wrong. 1  I have been anxious or worried for no good reason. 2  I have felt scared or panicky for no good reason. 0  Things have been getting on top of me. 1  I have been so unhappy that I have had difficulty sleeping. 0  I  have felt sad or miserable. 0  I have been so unhappy that I have been crying. 0  The thought of harming myself has occurred to me. 0  Edinburgh Postnatal Depression Scale Total 4     After visit meds:  Allergies as of 11/09/2021   No Known Allergies      Medication List     STOP taking these medications    fluconazole 150 MG tablet Commonly known as: DIFLUCAN       TAKE these medications    acetaminophen 325 MG tablet Commonly known as: Tylenol Take 2 tablets (650 mg total) by mouth every 4 (four) hours as needed (for pain scale < 4).   Blood Pressure Monitor Misc For regular home bp monitoring during pregnancy   folic acid 1 MG tablet Commonly known as: FOLVITE Take 1 mg by mouth daily.   ibuprofen 600 MG tablet Commonly known as: ADVIL Take 1 tablet (600 mg total) by mouth every 6 (six)  hours as needed.   multivitamin Chew chewable tablet Chew 2 tablets by mouth daily.         Discharge home in stable condition Infant Feeding: Breast Infant Disposition:home with mother Discharge instruction: per After Visit Summary and Postpartum booklet. Activity: Advance as tolerated. Pelvic rest for 6 weeks.  Diet: routine diet Future Appointments: Future Appointments  Date Time Provider [place]  12/12/2021  1:50 PM Roma Schanz, CNM [facility]   Follow up Visit: Myrtis Ser, CNM  Gloris [place] Please schedule this patient for Postpartum visit in: 4-6 weeks with the following provider: Any provider  Pt preference  For C/S patients schedule nurse incision check in weeks 2 weeks: no  Low risk pregnancy complicated by: none  Delivery mode:  SVD  Anticipated Birth Control:  other/unsure  PP Procedures needed: none  Schedule Integrated [place] visit: no  11/09/2021 Orvis Brill, DO  CNM attestation I have seen and examined this patient and agree with above documentation in the resident's note.   Catherine Park is a 25 y.o. G1P1001 s/p vag del.   Pain is well controlled.  Plan for birth control is  unsure .  Method of Feeding: breast  PE:  BP 111/76 (BP Location: Right Arm)   Pulse 73   Temp 98.7 F (37.1 C) (Oral)   Resp 18   Ht 5' 2"  (1.575 m)   Wt 88 kg   LMP 01/30/2021 (Exact Date)   SpO2 100%   Breastfeeding Unknown   BMI 35.48 kg/m  Fundus firm  Recent Labs    11/06/21 2139  HGB 12.5  HCT 37.3     Plan: discharge today - postpartum care discussed - f/u clinic in 6 weeks for postpartum visit   Myrtis Ser, CNM 8:48 AM 11/09/2021

## 2021-11-07 NOTE — Lactation Note (Signed)
This note was copied from a baby's chart. Lactation Consultation Note  Patient Name: Catherine Park [id number] Date: 11/07/2021 Reason for consult: L&D Initial assessment;Mother's request;Primapara;1st time breastfeeding;Term;Breastfeeding assistance Age:25 hours  LC did suck training, infant then latched in football with signs of milk transfer.  Mom provided hand pump to pre pump before latching for 5-10 min.   Plan 1. Feed based in cues 8-12x 24hr period. Mom to offer breasts and look for signs of milk transfer.  2. If not able to latch, Mom to offer EBM via spoon 3. I and O sheet reviewed.  All questions answered at the end of the visit.   Maternal Data Does the patient have breastfeeding experience prior to this delivery?: No  Feeding Mother's Current Feeding Choice: Breast Milk  LATCH Score Latch: Repeated attempts needed to sustain latch, nipple held in mouth throughout feeding, stimulation needed to elicit sucking reflex.  Audible Swallowing: Spontaneous and intermittent  Type of Nipple: Flat  Comfort (Breast/Nipple): Soft / non-tender  Hold (Positioning): Assistance needed to correctly position infant at breast and maintain latch.  LATCH Score: 7   Lactation Tools Discussed/Used Tools: Pump;Flanges Flange Size: 21 Breast pump type: Manual Pump Education: Setup, frequency, and cleaning;Milk Storage Reason for Pumping: elongate her nipples Pumping frequency: every 3 hrs for 15 min  Interventions Interventions: Breast feeding basics reviewed;Support pillows;Education;Assisted with latch;Position options;Skin to skin;Expressed Clinical research associate;Infant Driven Feeding Algorithm education;Hand express;Breast compression;Adjust position;Hand pump  Discharge WIC Program: Yes  Consult Status Consult Status: Follow-up from L&D Date: 11/08/21 Follow-up type: In-patient    Darrick [place]law  Nicholson-Springer 11/07/2021, 4:24 PM

## 2021-11-08 NOTE — Progress Notes (Signed)
Post Partum Day 1 Subjective: She is feeling well and is having cramping pain that is controlled. She is agreeable to going home today. She is ambulating, voiding, and tolerating oral intake without difficulty.  Objective: Blood pressure 115/76, pulse 79, temperature 98.1 F (36.7 C), resp. rate 17, height 5\' 2"  (1.575 m), weight 88 kg, last menstrual period 01/30/2021, SpO2 98 %, unknown if currently breastfeeding.  Physical Exam:  General: alert, cooperative, and no distress Lochia: appropriate Uterine Fundus: firm Incision: none DVT Evaluation: No evidence of DVT seen on physical exam.  Recent Labs    11/06/21 2139  HGB 12.5  HCT 37.3    Assessment/Plan: Demitria Hay is a 25 year old G1P1001 on PPD#1 who is doing well and meeting all goals. -Discuss contraceptive options, still undecided  -Stable for discharge   LOS: 2 days   22 11/08/2021, 7:16 AM   GME ATTESTATION:  I saw and evaluated the patient. I agree with the findings and the plan of care as documented in the resident's note.  She would like to go home today, however reports that she was told that baby would be monitored through tomorrow. Will await pediatrician evaluation later this morning, if clear to dc, then will discharge mom as well.   Discussed discharge education with mom in the event she leaves today.   11/10/2021, DO OB Fellow, Faculty [facility] 11/08/2021 8:13 AM

## 2021-11-08 NOTE — Lactation Note (Addendum)
This note was copied from a baby's chart. Lactation Consultation Note  Patient Name: Girl Esme Freund [id number] Date: 11/08/2021 Reason for consult: Follow-up assessment;Primapara;Term Age:25 hours  I assisted with latching with Mom in a couple of different positions using the teacup hold. "Camila" would only suck a few times and then fell asleep. Hand expression did not reveal enough colostrum to supplement with. Giving colostrum on a gloved finger was attempted, but infant gagged. I spoke with parents about options & they consented for DBM.   Bottle-feeding with an extra-slow flow nipple was attempted, but it was too fast. SLP was called and gave a timeline of arriving in about 30 minutes. Camila was placed skin-to-skin on Dad.   Tongue restriction suspected.   [name] [facility] 11/08/2021, 3:02 PM

## 2021-11-08 NOTE — Lactation Note (Signed)
This note was copied from a baby's chart. Lactation Consultation Note  Patient Name: Catherine Park Date: 11/08/2021 Reason for consult: Follow-up assessment;Mother's request;Difficult latch;Term;Other (Comment) (SLP consult) Age:25 hours  Parents called with assistance getting infant latched at breast. LC did suck training to bring tongue down. LC attempted latch at breast with 20 NS and curve tip with EBM infant did a few sucks and then broke latch at breast. Infant completed feeding in sideline with SLP provided nipple taking 10 ml of EBM. Mom aware to offer 15 ml or more per feeding if infant not latching at the breast.   Mom to pump with DEBP q 3hrs for . Mom to offer EBM first followed by [facility].   All questions answered at the end of the visit.   Maternal Data    Feeding Mother's Current Feeding Choice: Breast Milk and Donor Milk Nipple Type: Dr. Lorne Skeens  [facility] Score                    Lactation Tools Discussed/Used Breast pump type: Double-Electric Breast Pump Pump Education: Setup, frequency, and cleaning;Milk Storage Pumping frequency: every 3 hrs foe  Interventions Interventions: Breast feeding basics reviewed;Education;Pace feeding;Infant Driven Feeding Algorithm education;DEBP  Discharge    Consult Status Consult Status: Follow-up Date: 11/09/21 Follow-up type: In-patient    Donnielle Addison  Nicholson-Springer 11/08/2021, 7:37 PM

## 2021-11-09 MED ORDER — ACETAMINOPHEN 325 MG PO TABS: 650.0000 mg | ORAL_TABLET | ORAL | Status: DC | PRN

## 2021-11-09 MED ORDER — IBUPROFEN 600 MG PO TABS: 600.0000 mg | ORAL_TABLET | Freq: Four times a day (QID) | ORAL | 0 refills | Status: DC | PRN

## 2021-11-09 NOTE — Lactation Note (Signed)
This note was copied from a baby's chart. Lactation Consultation Note  Patient Name: [name] [id number] Date: 11/09/2021 Reason for consult: Mother's request Age:25 hours  Infant was observed bottle feeding--the ultra preemie nipple is still appropriate. I will fax [facility] as Mom has not heard from them, yet. Mom knows to take all her pump parts with her.   Compared to yesterday, infant with improved vigor & improved tongue movement.   [name] [facility] 11/09/2021, 1:42 PM

## 2021-11-09 NOTE — Lactation Note (Signed)
This note was copied from a baby's chart. Lactation Consultation Note  Patient Name: Catherine Park [id number] Date: 11/09/2021 Reason for consult: Follow-up assessment;Term Age:25 hours  Mom's milk is coming in. Her breasts palpate fuller than yesterday (it has been 2 hrs since she last pumped) and her breasts appear larger today. Mom says she can express about 20 mL with the pump. I suggested she do hand expression for a few minutes after using the pump to maximize milk expression volumes.  Parents are willing to increase intake volumes, but infant had already bottle-fed for 30 min when I entered the room. Infant soon fell asleep & parents will call for me to return when infant is ready to feed again.   Since Mom's milk is coming in, I gave her the option of seeing how infant does at the breast today, but Mom would prefer to wait until her full supply is in. Mom consented to me sending a referral to the lactation clinic for f/u.    [name] [facility] 11/09/2021, 1:04 PM

## 2021-11-09 NOTE — Progress Notes (Signed)
Post Partum Day 2 Subjective: Catherine Park is feeling well. She had some cramping overnight. She is ambulating, voiding, and tolerating PO intake well. Minimal bleeding. She is ready to go home.  Objective: Blood pressure 111/76, pulse 73, temperature 98.7 F (37.1 C), temperature source Oral, resp. rate 18, height 5\' 2"  (1.575 m), weight 88 kg, last menstrual period 01/30/2021, SpO2 100 %, unknown if currently breastfeeding.  Physical Exam:  General: alert, cooperative, and no distress Lochia: appropriate Uterine Fundus: firm DVT Evaluation: No evidence of DVT seen on physical exam. No cords or calf tenderness. No significant calf/ankle edema.  Recent Labs    11/06/21 2139  HGB 12.5  HCT 37.3    Assessment/Plan: Catherine Park is a 25 year old G1P1001 on PPD #2 who is doing well and meeting all goals. - Postpartum visit scheduled - Stable for discharge   LOS: 3 days   22 11/09/2021, 7:15 AM

## 2021-11-10 ENCOUNTER — Telehealth: Payer: Self-pay

## 2021-11-10 NOTE — Telephone Encounter (Signed)
Transition Care Management Follow-up Telephone Call Date of discharge and from where: 11/09/2021-[facility]  How have you been since you were released from the hospital? Patient stated she is doing fine.  Any questions or concerns? No  Items Reviewed: Did the pt receive and understand the discharge instructions provided? Yes  Medications obtained and verified?  No new medications given.  Other? No  Any new allergies since your discharge? No  Dietary orders reviewed? No Do you have support at home? Yes   Home Care and Equipment/Supplies: Were home health services ordered? not applicable If so, what is the name of the agency? N/A  Has the agency set up a time to come to the patient's home? not applicable Were any new equipment or medical supplies ordered?  No What is the name of the medical supply agency? N/A Were you able to get the supplies/equipment? not applicable Do you have any questions related to the use of the equipment or supplies? No  Functional Questionnaire: (I = Independent and D = Dependent) ADLs: I  Bathing/Dressing- I  Meal Prep- I  Eating- I  Maintaining continence- I  Transferring/Ambulation- I  Managing Meds- I  Follow up appointments reviewed:  PCP Hospital f/u appt confirmed? No   Specialist Hospital f/u appt confirmed? Yes  Scheduled to see OBGYN on 12/12/2021 @ 1:50PM. Are transportation arrangements needed? No  If their condition worsens, is the pt aware to call PCP or go to the Emergency Dept.? Yes Was the patient provided with contact information for the PCP's office or ED? Yes Was to pt encouraged to call back with questions or concerns? Yes

## 2021-11-13 ENCOUNTER — Encounter: Payer: Medicaid Other | Admitting: Women's Health

## 2021-11-21 ENCOUNTER — Telehealth (HOSPITAL_COMMUNITY): Payer: Self-pay | Admitting: *Deleted

## 2021-11-21 NOTE — Telephone Encounter (Signed)
Phone voicemail message left to return nurse call.  Duffy Rhody, RN 11-21-2021 at 9:44am

## 2021-11-23 DIAGNOSIS — Z419 Encounter for procedure for purposes other than remedying health state, unspecified: Secondary | ICD-10-CM | POA: Diagnosis not present

## 2021-12-12 ENCOUNTER — Encounter: Payer: Self-pay | Admitting: Women's Health

## 2021-12-12 ENCOUNTER — Other Ambulatory Visit: Payer: Self-pay

## 2021-12-12 ENCOUNTER — Ambulatory Visit (INDEPENDENT_AMBULATORY_CARE_PROVIDER_SITE_OTHER): Payer: Medicaid Other | Admitting: Women's Health

## 2021-12-12 DIAGNOSIS — Z3009 Encounter for other general counseling and advice on contraception: Secondary | ICD-10-CM

## 2021-12-12 NOTE — Progress Notes (Signed)
POSTPARTUM VISIT Patient name: Catherine Park MRN [id number]  Date of birth: [birth year] Chief Complaint:   Postpartum Care (Feel like something heavy abdominal area)  History of Present Illness:   Catherine Park is a 25 y.o. G35P1001 Hispanic female being seen today for a postpartum visit. She is 5 weeks postpartum following a spontaneous vaginal delivery at 39.1 gestational weeks. IOL: no, for n/a. Anesthesia: none.  Laceration: none.  Complications: none. Inpatient contraception: no.   Pregnancy uncomplicated. Tobacco use: no. Substance use disorder: no. Last pap smear: 06/07/21 and results were NILM w/ HRHPV not done. Next pap smear due: 2025 No LMP recorded. (Menstrual status: Lactating).  Postpartum course has been complicated by burning sharp sensation when baby latches. Reports lower abdominal pressure since yesterday, no UTI sx, no constipation, denies abnormal discharge, itching/odor/irritation.  Bleeding none. Bowel function is normal. Bladder function is normal. Urinary incontinence? no, fecal incontinence? no Patient is not sexually active. Last sexual activity: prior to birth of baby. Desired contraception: Condoms and w/drawal . Patient does want a pregnancy in the future.  Desired family size is 3-4 children.   [facility] - 12/12/21 1421       Pregnancy Intention Screening   Does the patient want to become pregnant in the next year? No    Does the patient's partner want to become pregnant in the next year? No    Would the patient like to discuss contraceptive options today? No      Contraception Wrap Up   Current Method Abstinence    End Method Abstinence    Contraception Counseling Provided No            The pregnancy intention screening data noted above was reviewed. Potential methods of contraception were discussed. The patient elected to proceed with Abstinence.  Edinburgh Postpartum Depression Screening: negative  Edinburgh Postnatal Depression  Scale - 12/12/21 1415       Edinburgh Postnatal Depression Scale:  In the Past 7 Days   I have been able to laugh and see the funny side of things. 0    I have looked forward with enjoyment to things. 0    I have blamed myself unnecessarily when things went wrong. 1    I have been anxious or worried for no good reason. 1    I have felt scared or panicky for no good reason. 0    Things have been getting on top of me. 1    I have been so unhappy that I have had difficulty sleeping. 0    I have felt sad or miserable. 0    I have been so unhappy that I have been crying. 0    The thought of harming myself has occurred to me. 0    Edinburgh Postnatal Depression Scale Total 3             GAD 7 : Generalized Anxiety Score 08/17/2021 05/10/2021  Nervous, Anxious, on Edge 0 1  Control/stop worrying 0 1  Worry too much - different things 0 1  Trouble relaxing 0 0  Restless 0 1  Easily annoyed or irritable 0 1  Afraid - awful might happen 0 0  Total GAD 7 Score 0 5     Baby's course has been uncomplicated. Baby is feeding by breast: milk supply adequate. Infant has a pediatrician/family doctor? Yes.  Childcare strategy if returning to work/school: n/a-stay at home mom.  Pt has material needs met for her and baby: Yes.  Review of Systems:   Pertinent items are noted in HPI Denies Abnormal vaginal discharge w/ itching/odor/irritation, headaches, visual changes, shortness of breath, chest pain, abdominal pain, severe nausea/vomiting, or problems with urination or bowel movements. Pertinent History Reviewed:  Reviewed past medical,surgical, obstetrical and family history.  Reviewed problem list, medications and allergies. OB History  Gravida Para Term Preterm AB Living  1 1 1  0 0 1  SAB IAB Ectopic Multiple Live Births  0 0 0 0 1    # Outcome Date GA Lbr Len/2nd Weight Sex Delivery Anes PTL Lv  1 Term 11/07/21 27w1d07:31 / 00:16 7 lb 7.9 oz (3.399 kg) F Vag-Spont Other  LIV    Physical Assessment:   Vitals:   12/12/21 1410  BP: 116/73  Pulse: 80  Weight: 176 lb (79.8 kg)  Height: 5' 2"  (1.575 m)  Body mass index is 32.19 kg/m.       Physical Examination:   General appearance: alert, well appearing, and in no distress  Mental status: alert, oriented to person, place, and time  Skin: warm & dry   Cardiovascular: normal heart rate noted   Respiratory: normal respiratory effort, no distress   Breasts: deferred, no complaints   Abdomen: soft, non-tender   Pelvic: examination not indicated. Thin prep pap obtained: No  Rectal: not examined  Extremities: Edema: none   Chaperone: N/A         No results found for this or any previous visit (from the past 24 hour(s)).  Assessment & Plan:  1) Postpartum exam 2) 5 wks s/p spontaneous vaginal delivery 3) breast feeding> rx APNO for possible yeast 4) Depression screening 5) Contraception counseling> condoms/withdrawal  Essential components of care per ACOG recommendations:  1.  Mood and well being:  If positive depression screen, discussed and plan developed.  If using tobacco we discussed reduction/cessation and risk of relapse If current substance abuse, we discussed and referral to local resources was offered.   2. Infant care and feeding:  If breastfeeding, discussed returning to work, pumping, breastfeeding-associated pain, guidance regarding return to fertility while lactating if not using another method. If needed, patient was provided with a letter to be allowed to pump q 2-3hrs to support lactation in a private location with access to a refrigerator to store breastmilk.   Recommended that all caregivers be immunized for flu, pertussis and other preventable communicable diseases If pt does not have material needs met for her/baby, referred to local resources for help obtaining these.  3. Sexuality, contraception and birth spacing Provided guidance regarding sexuality, management of dyspareunia,  and resumption of intercourse Discussed avoiding interpregnancy interval <665ms and recommended birth spacing of 18 months  4. Sleep and fatigue Discussed coping options for fatigue and sleep disruption Encouraged family/partner/community support of 4 hrs of uninterrupted sleep to help with mood and fatigue  5. Physical recovery  If pt had a C/S, assessed incisional pain and providing guidance on normal vs prolonged recovery If pt had a laceration, perineal healing and pain reviewed.  If urinary or fecal incontinence, discussed management and referred to PT or uro/gyn if indicated  Patient is safe to resume physical activity. Discussed attainment of healthy weight.  6.  Chronic disease management Discussed pregnancy complications if any, and their implications for future childbearing and long-term maternal health. Review recommendations for prevention of recurrent pregnancy complications, such as 17 hydroxyprogesterone caproate to reduce risk for recurrent PTB not applicable, or aspirin to reduce risk of preeclampsia not applicable.  Pt had GDM: no. If yes, 2hr GTT scheduled: not applicable. Reviewed medications and non-pregnant dosing including consideration of whether pt is breastfeeding using a reliable resource such as LactMed: not applicable Referred for f/u w/ PCP or subspecialist providers as indicated: not applicable  7. Health maintenance Mammogram at 25yo or earlier if indicated Pap smears as indicated  Meds: No orders of the defined types were placed in this encounter.   Follow-up: Return in about 1 year (around 12/12/2022) for Physical.   No orders of the defined types were placed in this encounter.   [place], [facility] 12/12/2021 2:49 PM

## 2021-12-24 DIAGNOSIS — Z419 Encounter for procedure for purposes other than remedying health state, unspecified: Secondary | ICD-10-CM | POA: Diagnosis not present

## 2022-01-24 DIAGNOSIS — Z419 Encounter for procedure for purposes other than remedying health state, unspecified: Secondary | ICD-10-CM | POA: Diagnosis not present

## 2022-02-21 DIAGNOSIS — Z419 Encounter for procedure for purposes other than remedying health state, unspecified: Secondary | ICD-10-CM | POA: Diagnosis not present

## 2022-03-24 DIAGNOSIS — Z419 Encounter for procedure for purposes other than remedying health state, unspecified: Secondary | ICD-10-CM | POA: Diagnosis not present

## 2022-04-23 DIAGNOSIS — Z419 Encounter for procedure for purposes other than remedying health state, unspecified: Secondary | ICD-10-CM | POA: Diagnosis not present

## 2022-05-24 DIAGNOSIS — Z419 Encounter for procedure for purposes other than remedying health state, unspecified: Secondary | ICD-10-CM | POA: Diagnosis not present

## 2022-06-23 DIAGNOSIS — Z419 Encounter for procedure for purposes other than remedying health state, unspecified: Secondary | ICD-10-CM | POA: Diagnosis not present

## 2022-07-24 DIAGNOSIS — Z419 Encounter for procedure for purposes other than remedying health state, unspecified: Secondary | ICD-10-CM | POA: Diagnosis not present

## 2022-08-24 DIAGNOSIS — Z419 Encounter for procedure for purposes other than remedying health state, unspecified: Secondary | ICD-10-CM | POA: Diagnosis not present

## 2022-09-11 DIAGNOSIS — E6609 Other obesity due to excess calories: Secondary | ICD-10-CM | POA: Diagnosis not present

## 2022-09-11 DIAGNOSIS — Z6833 Body mass index (BMI) 33.0-33.9, adult: Secondary | ICD-10-CM | POA: Diagnosis not present

## 2022-09-11 DIAGNOSIS — B354 Tinea corporis: Secondary | ICD-10-CM | POA: Diagnosis not present

## 2022-09-23 DIAGNOSIS — Z419 Encounter for procedure for purposes other than remedying health state, unspecified: Secondary | ICD-10-CM | POA: Diagnosis not present

## 2022-10-24 DIAGNOSIS — Z419 Encounter for procedure for purposes other than remedying health state, unspecified: Secondary | ICD-10-CM | POA: Diagnosis not present

## 2022-11-23 DIAGNOSIS — Z419 Encounter for procedure for purposes other than remedying health state, unspecified: Secondary | ICD-10-CM | POA: Diagnosis not present

## 2022-12-24 DIAGNOSIS — Z419 Encounter for procedure for purposes other than remedying health state, unspecified: Secondary | ICD-10-CM | POA: Diagnosis not present

## 2023-01-24 DIAGNOSIS — Z419 Encounter for procedure for purposes other than remedying health state, unspecified: Secondary | ICD-10-CM | POA: Diagnosis not present

## 2023-02-22 DIAGNOSIS — Z419 Encounter for procedure for purposes other than remedying health state, unspecified: Secondary | ICD-10-CM | POA: Diagnosis not present

## 2023-03-25 DIAGNOSIS — Z419 Encounter for procedure for purposes other than remedying health state, unspecified: Secondary | ICD-10-CM | POA: Diagnosis not present

## 2023-04-08 DIAGNOSIS — B354 Tinea corporis: Secondary | ICD-10-CM | POA: Diagnosis not present

## 2023-04-08 DIAGNOSIS — Z6833 Body mass index (BMI) 33.0-33.9, adult: Secondary | ICD-10-CM | POA: Diagnosis not present

## 2023-04-08 DIAGNOSIS — E6609 Other obesity due to excess calories: Secondary | ICD-10-CM | POA: Diagnosis not present

## 2023-04-24 DIAGNOSIS — Z419 Encounter for procedure for purposes other than remedying health state, unspecified: Secondary | ICD-10-CM | POA: Diagnosis not present

## 2023-05-25 DIAGNOSIS — Z419 Encounter for procedure for purposes other than remedying health state, unspecified: Secondary | ICD-10-CM | POA: Diagnosis not present

## 2023-06-03 ENCOUNTER — Other Ambulatory Visit (INDEPENDENT_AMBULATORY_CARE_PROVIDER_SITE_OTHER): Payer: Medicaid Other | Admitting: *Deleted

## 2023-06-03 ENCOUNTER — Encounter: Payer: Self-pay | Admitting: *Deleted

## 2023-06-03 VITALS — BP 132/73 | HR 106

## 2023-06-03 DIAGNOSIS — Z3201 Encounter for pregnancy test, result positive: Secondary | ICD-10-CM

## 2023-06-03 LAB — POCT URINE PREGNANCY: Preg Test, Ur: POSITIVE — AB

## 2023-06-03 NOTE — Progress Notes (Cosign Needed Addendum)
   NURSE VISIT- PREGNANCY CONFIRMATION   SUBJECTIVE:  Catherine Park is a 27 y.o. G90P1001 female at [redacted]w[redacted]d by certain LMP of Patient's last menstrual period was 04/14/2023. Here for pregnancy confirmation.  Home pregnancy test: positive x 2   She reports nausea.  She is not taking prenatal vitamins.    OBJECTIVE:  BP 132/73 (BP Location: Right Arm, Patient Position: Sitting, Cuff Size: Normal)   Pulse (!) 106   LMP 04/14/2023   Breastfeeding No   Appears well, in no apparent distress  Results for orders placed or performed in visit on 06/03/23 (from the past 24 hour(s))  POCT urine pregnancy   Collection Time: 06/03/23  4:05 PM  Result Value Ref Range   Preg Test, Ur Positive (A) Negative    ASSESSMENT: Positive pregnancy test, [redacted]w[redacted]d by LMP    PLAN: Schedule for dating ultrasound in next availabe day Prenatal vitamins: plans to begin OTC ASAP   Nausea medicines: not currently needed   OB packet given: Yes  Annamarie Dawley  06/03/2023 4:06 PM

## 2023-06-14 ENCOUNTER — Other Ambulatory Visit: Payer: Self-pay | Admitting: Obstetrics & Gynecology

## 2023-06-14 DIAGNOSIS — O3680X0 Pregnancy with inconclusive fetal viability, not applicable or unspecified: Secondary | ICD-10-CM

## 2023-06-17 ENCOUNTER — Ambulatory Visit (INDEPENDENT_AMBULATORY_CARE_PROVIDER_SITE_OTHER): Payer: Medicaid Other

## 2023-06-17 ENCOUNTER — Encounter: Payer: Self-pay | Admitting: Obstetrics & Gynecology

## 2023-06-17 DIAGNOSIS — Z3481 Encounter for supervision of other normal pregnancy, first trimester: Secondary | ICD-10-CM | POA: Diagnosis not present

## 2023-06-17 DIAGNOSIS — Z3A01 Less than 8 weeks gestation of pregnancy: Secondary | ICD-10-CM

## 2023-06-17 DIAGNOSIS — O3680X0 Pregnancy with inconclusive fetal viability, not applicable or unspecified: Secondary | ICD-10-CM

## 2023-06-17 NOTE — Progress Notes (Signed)
[place] 7+1 wks,single IUP with yolk sac,CRL 10.84 mm,normal ovaries

## 2023-06-24 DIAGNOSIS — Z419 Encounter for procedure for purposes other than remedying health state, unspecified: Secondary | ICD-10-CM | POA: Diagnosis not present

## 2023-07-18 ENCOUNTER — Encounter: Payer: Self-pay | Admitting: Women's Health

## 2023-07-18 DIAGNOSIS — Z3490 Encounter for supervision of normal pregnancy, unspecified, unspecified trimester: Secondary | ICD-10-CM | POA: Insufficient documentation

## 2023-07-22 ENCOUNTER — Other Ambulatory Visit: Payer: Self-pay | Admitting: Obstetrics & Gynecology

## 2023-07-22 DIAGNOSIS — M9902 Segmental and somatic dysfunction of thoracic region: Secondary | ICD-10-CM | POA: Diagnosis not present

## 2023-07-22 DIAGNOSIS — M9903 Segmental and somatic dysfunction of lumbar region: Secondary | ICD-10-CM | POA: Diagnosis not present

## 2023-07-22 DIAGNOSIS — M9905 Segmental and somatic dysfunction of pelvic region: Secondary | ICD-10-CM | POA: Diagnosis not present

## 2023-07-22 DIAGNOSIS — Z3682 Encounter for antenatal screening for nuchal translucency: Secondary | ICD-10-CM

## 2023-07-22 DIAGNOSIS — M5442 Lumbago with sciatica, left side: Secondary | ICD-10-CM | POA: Diagnosis not present

## 2023-07-23 ENCOUNTER — Ambulatory Visit: Payer: Medicaid Other | Admitting: Women's Health

## 2023-07-23 ENCOUNTER — Encounter: Payer: Medicaid Other | Admitting: *Deleted

## 2023-07-23 ENCOUNTER — Encounter: Payer: Self-pay | Admitting: Women's Health

## 2023-07-23 ENCOUNTER — Ambulatory Visit (INDEPENDENT_AMBULATORY_CARE_PROVIDER_SITE_OTHER): Payer: Medicaid Other

## 2023-07-23 VITALS — BP 113/74 | HR 87 | Wt 184.0 lb

## 2023-07-23 DIAGNOSIS — Z3480 Encounter for supervision of other normal pregnancy, unspecified trimester: Secondary | ICD-10-CM

## 2023-07-23 DIAGNOSIS — Z3481 Encounter for supervision of other normal pregnancy, first trimester: Secondary | ICD-10-CM

## 2023-07-23 DIAGNOSIS — Z113 Encounter for screening for infections with a predominantly sexual mode of transmission: Secondary | ICD-10-CM | POA: Diagnosis not present

## 2023-07-23 DIAGNOSIS — Z131 Encounter for screening for diabetes mellitus: Secondary | ICD-10-CM

## 2023-07-23 DIAGNOSIS — Z1379 Encounter for other screening for genetic and chromosomal anomalies: Secondary | ICD-10-CM

## 2023-07-23 DIAGNOSIS — Z3682 Encounter for antenatal screening for nuchal translucency: Secondary | ICD-10-CM | POA: Diagnosis not present

## 2023-07-23 DIAGNOSIS — Z3A12 12 weeks gestation of pregnancy: Secondary | ICD-10-CM | POA: Diagnosis not present

## 2023-07-23 NOTE — Progress Notes (Signed)
INITIAL OBSTETRICAL VISIT Patient name: Catherine Park MRN [id number]  Date of birth: 07-24-1996 Chief Complaint:   Initial Prenatal Visit  History of Present Illness:   Catherine Park is a 27 y.o. G41P1001 Hispanic female at [redacted]w[redacted]d by [place] at 7 weeks with an Estimated Date of Delivery: 02/02/24 being seen today for her initial obstetrical visit.   Patient's last menstrual period was 04/14/2023. Her obstetrical history is significant for  term uncomplicated SVB x 1 .   Today she reports low back pain and nausea. Declines meds right now.  Last pap 06/07/21. Results were: NILM w/ HRHPV not done     07/23/2023   10:42 AM 08/17/2021    9:08 AM 05/10/2021    3:15 PM 10/07/2019   11:27 AM  Depression screen PHQ 2/9  Decreased Interest 0 0 0 0  Down, Depressed, Hopeless 0 0 0 0  PHQ - 2 Score 0 0 0 0  Altered sleeping 0 1 1   Tired, decreased energy 0 1 0   Change in appetite 0 1 1   Feeling bad or failure about yourself  0 0 0   Trouble concentrating 0 1 1   Moving slowly or fidgety/restless 0 0 0   Suicidal thoughts 0 0 0   PHQ-9 Score 0 4 3         07/23/2023   10:42 AM 08/17/2021    9:08 AM 05/10/2021    3:16 PM  GAD 7 : Generalized Anxiety Score  Nervous, Anxious, on Edge 0 0 1  Control/stop worrying 0 0 1  Worry too much - different things 0 0 1  Trouble relaxing 0 0 0  Restless 0 0 1  Easily annoyed or irritable 0 0 1  Afraid - awful might happen 0 0 0  Total GAD 7 Score 0 0 5     Review of Systems:   Pertinent items are noted in HPI Denies cramping/contractions, leakage of fluid, vaginal bleeding, abnormal vaginal discharge w/ itching/odor/irritation, headaches, visual changes, shortness of breath, chest pain, abdominal pain, severe nausea/vomiting, or problems with urination or bowel movements unless otherwise stated above.  Pertinent History Reviewed:  Reviewed past medical,surgical, social, obstetrical and family history.  Reviewed problem list,  medications and allergies. OB History  Gravida Para Term Preterm AB Living  2 1 1  0 0 1  SAB IAB Ectopic Multiple Live Births  0 0 0 0 1    # Outcome Date GA Lbr Len/2nd Weight Sex Type Anes PTL Lv  2 Current           1 Term 11/07/21 110w1d 07:31 / 00:16 7 lb 7.9 oz (3.399 kg) F Vag-Spont Other  LIV   Physical Assessment:   Vitals:   07/23/23 1029  BP: 113/74  Pulse: 87  Weight: 184 lb (83.5 kg)  Body mass index is 33.65 kg/m.       Physical Examination:  General appearance - well appearing, and in no distress  Mental status - alert, oriented to person, place, and time  Psych:  She has a normal mood and affect  Skin - warm and dry, normal color, no suspicious lesions noted  Chest - effort normal, all lung fields clear to auscultation bilaterally  Heart - normal rate and regular rhythm  Abdomen - soft, nontender  Extremities:  No swelling or varicosities noted  Thin prep pap is not done   Chaperone: N/A    TODAY'S NT [place] 12+2 wks,measurements c/w dates,NB present,NT  1.7 mm,FHR 158 bpm,normal ovaries,CRL 67.70 mm   No results found for this or any previous visit (from the past 24 hour(s)).  Assessment & Plan:  1) Low-Risk Pregnancy G2P1001 at [redacted]w[redacted]d with an Estimated Date of Delivery: 02/02/24   2) Initial OB visit  3) Nausea> declines meds  Meds: No orders of the defined types were placed in this encounter.   Initial labs obtained Continue prenatal vitamins Reviewed n/v relief measures and warning s/s to report Reviewed recommended weight gain based on pre-gravid BMI Encouraged well-balanced diet Genetic & carrier screening discussed: requests Panorama and NT/IT, had Horizon last pregnancy Ultrasound discussed; fetal survey: requested CCNC completed> form faxed if has or is planning to apply for medicaid The nature of [facility] [employer] with multiple MDs and other Advanced Practice Providers was explained to patient; also emphasized that  fellows, residents, and students are part of our team. Does have home bp cuff. Office bp cuff given: no. Rx sent: n/a. Check bp weekly, let us know if consistently >140/90.   Indications for early A1C (per uptodate) BMI >=25 (>=23 in Asian women) AND one of the following High-risk race/ethnicity (eg, African American, Latino, Native American, [place] American, Pacific Islander) Yes  Follow-up: No follow-ups on file.   Orders Placed This Encounter  Procedures   Urine Culture   GC/Chlamydia Probe Amp   CBC/D/Plt+RPR+Rh+ABO+RubIgG...   [facility] PRENATAL TEST   Integrated 1    Cheral Marker CNM, [facility] 07/23/2023 11:39 AM

## 2023-07-23 NOTE — Patient Instructions (Signed)
[place], thank you for choosing our office today! We appreciate the opportunity to meet your healthcare needs. You may receive a short survey by mail, e-mail, or through [employer]. If you are happy with your care we would appreciate if you could take just a few minutes to complete the survey questions. We read all of your comments and take your feedback very seriously. Thank you again for choosing our office.  [facility] for [employer] [facility] at [facility]  [facility] at [facility] ([address] [place], [place] [address]) Entrance C, located off of [address] Free 24/7 valet parking   Nausea & Vomiting Have saltine crackers or pretzels by your bed and eat a few bites before you raise your head out of bed in the morning Eat small frequent meals throughout the day instead of large meals Drink plenty of fluids throughout the day to stay hydrated, just don't drink a lot of fluids with your meals.  This can make your stomach fill up faster making you feel sick Do not brush your teeth right after you eat Products with real ginger are good for nausea, like ginger ale and ginger hard candy Make sure it says made with real ginger! Sucking on sour candy like lemon heads is also good for nausea If your prenatal vitamins make you nauseated, take them at night so you will sleep through the nausea Sea Bands If you feel like you need medicine for the nausea & vomiting please let us know If you are unable to keep any fluids or food down please let us know   Constipation Drink plenty of fluid, preferably water, throughout the day Eat foods high in fiber such as fruits, vegetables, and grains Exercise, such as walking, is a good way to keep your bowels regular Drink warm fluids, especially warm prune juice, or decaf coffee Eat a 1/2 cup of real oatmeal (not instant), 1/2 cup applesauce, and 1/2-1 cup warm prune juice every day If needed, you may take Colace (docusate sodium) stool softener  once or twice a day to help keep the stool soft.  If you still are having problems with constipation, you may take Miralax once daily as needed to help keep your bowels regular.   Home Blood Pressure Monitoring for Patients   Your provider has recommended that you check your blood pressure (BP) at least once a week at home. If you do not have a blood pressure cuff at home, one will be provided for you. Contact your provider if you have not received your monitor within 1 week.   Helpful Tips for Accurate Home Blood Pressure Checks  Don't smoke, exercise, or drink caffeine 30 minutes before checking your BP Use the restroom before checking your BP (a full bladder can raise your pressure) Relax in a comfortable upright chair Feet on the ground Left arm resting comfortably on a flat surface at the level of your heart Legs uncrossed Back supported Sit quietly and don't talk Place the cuff on your bare arm Adjust snuggly, so that only two fingertips can fit between your skin and the top of the cuff Check 2 readings separated by at least one minute Keep a log of your BP readings For a visual, please reference this diagram: http://ccnc.care/bpdiagram  Provider Name: [facility]     Phone: [phone]  Zone 1: ALL CLEAR  Continue to monitor your symptoms:  BP reading is less than 140 (top number) or less than 90 (bottom  number)  No right upper stomach pain No headaches or seeing spots No feeling nauseated or throwing up No swelling in face and hands  Zone 2: CAUTION Call your doctor's office for any of the following:  BP reading is greater than 140 (top number) or greater than 90 (bottom number)  Stomach pain under your ribs in the middle or right side Headaches or seeing spots Feeling nauseated or throwing up Swelling in face and hands  Zone 3: EMERGENCY  Seek immediate medical care if you have any of the following:  BP reading is greater than160 (top number) or greater than  110 (bottom number) Severe headaches not improving with Tylenol Serious difficulty catching your breath Any worsening symptoms from Zone 2    First Trimester of Pregnancy The first trimester of pregnancy is from week 1 until the end of week 12 (months 1 through 3). A week after a sperm fertilizes an egg, the egg will implant on the wall of the uterus. This embryo will begin to develop into a baby. Genes from you and your partner are forming the baby. The female genes determine whether the baby is a boy or a girl. At 6-8 weeks, the eyes and face are formed, and the heartbeat can be seen on ultrasound. At the end of 12 weeks, all the baby's organs are formed.  Now that you are pregnant, you will want to do everything you can to have a healthy baby. Two of the most important things are to get good prenatal care and to follow your health care provider's instructions. Prenatal care is all the medical care you receive before the baby's birth. This care will help prevent, find, and treat any problems during the pregnancy and childbirth. BODY CHANGES Your body goes through many changes during pregnancy. The changes vary from woman to woman.  You may gain or lose a couple of pounds at first. You may feel sick to your stomach (nauseous) and throw up (vomit). If the vomiting is uncontrollable, call your health care provider. You may tire easily. You may develop headaches that can be relieved by medicines approved by your health care provider. You may urinate more often. Painful urination may mean you have a bladder infection. You may develop heartburn as a result of your pregnancy. You may develop constipation because certain hormones are causing the muscles that push waste through your intestines to slow down. You may develop hemorrhoids or swollen, bulging veins (varicose veins). Your breasts may begin to grow larger and become tender. Your nipples may stick out more, and the tissue that surrounds them  (areola) may become darker. Your gums may bleed and may be sensitive to brushing and flossing. Dark spots or blotches (chloasma, mask of pregnancy) may develop on your face. This will likely fade after the baby is born. Your menstrual periods will stop. You may have a loss of appetite. You may develop cravings for certain kinds of food. You may have changes in your emotions from day to day, such as being excited to be pregnant or being concerned that something may go wrong with the pregnancy and baby. You may have more vivid and strange dreams. You may have changes in your hair. These can include thickening of your hair, rapid growth, and changes in texture. Some women also have hair loss during or after pregnancy, or hair that feels dry or thin. Your hair will most likely return to normal after your baby is born. WHAT TO EXPECT AT YOUR PRENATAL  VISITS During a routine prenatal visit: You will be weighed to make sure you and the baby are growing normally. Your blood pressure will be taken. Your abdomen will be measured to track your baby's growth. The fetal heartbeat will be listened to starting around week 10 or 12 of your pregnancy. Test results from any previous visits will be discussed. Your health care provider may ask you: How you are feeling. If you are feeling the baby move. If you have had any abnormal symptoms, such as leaking fluid, bleeding, severe headaches, or abdominal cramping. If you have any questions. Other tests that may be performed during your first trimester include: Blood tests to find your blood type and to check for the presence of any previous infections. They will also be used to check for low iron levels (anemia) and Rh antibodies. Later in the pregnancy, blood tests for diabetes will be done along with other tests if problems develop. Urine tests to check for infections, diabetes, or protein in the urine. An ultrasound to confirm the proper growth and development  of the baby. An amniocentesis to check for possible genetic problems. Fetal screens for spina bifida and Down syndrome. You may need other tests to make sure you and the baby are doing well. HOME CARE INSTRUCTIONS  Medicines Follow your health care provider's instructions regarding medicine use. Specific medicines may be either safe or unsafe to take during pregnancy. Take your prenatal vitamins as directed. If you develop constipation, try taking a stool softener if your health care provider approves. Diet Eat regular, well-balanced meals. Choose a variety of foods, such as meat or vegetable-based protein, fish, milk and low-fat dairy products, vegetables, fruits, and whole grain breads and cereals. Your health care provider will help you determine the amount of weight gain that is right for you. Avoid raw meat and uncooked cheese. These carry germs that can cause birth defects in the baby. Eating four or five small meals rather than three large meals a day may help relieve nausea and vomiting. If you start to feel nauseous, eating a few soda crackers can be helpful. Drinking liquids between meals instead of during meals also seems to help nausea and vomiting. If you develop constipation, eat more high-fiber foods, such as fresh vegetables or fruit and whole grains. Drink enough fluids to keep your urine clear or pale yellow. Activity and Exercise Exercise only as directed by your health care provider. Exercising will help you: Control your weight. Stay in shape. Be prepared for labor and delivery. Experiencing pain or cramping in the lower abdomen or low back is a good sign that you should stop exercising. Check with your health care provider before continuing normal exercises. Try to avoid standing for long periods of time. Move your legs often if you must stand in one place for a long time. Avoid heavy lifting. Wear low-heeled shoes, and practice good posture. You may continue to have sex  unless your health care provider directs you otherwise. Relief of Pain or Discomfort Wear a good support bra for breast tenderness.   Take warm sitz baths to soothe any pain or discomfort caused by hemorrhoids. Use hemorrhoid cream if your health care provider approves.   Rest with your legs elevated if you have leg cramps or low back pain. If you develop varicose veins in your legs, wear support hose. Elevate your feet for 15 minutes, 3-4 times a day. Limit salt in your diet. Prenatal Care Schedule your prenatal visits by the  twelfth week of pregnancy. They are usually scheduled monthly at first, then more often in the last 2 months before delivery. Write down your questions. Take them to your prenatal visits. Keep all your prenatal visits as directed by your health care provider. Safety Wear your seat belt at all times when driving. Make a list of emergency phone numbers, including numbers for family, friends, the hospital, and police and fire departments. General Tips Ask your health care provider for a referral to a local prenatal education class. Begin classes no later than at the beginning of month 6 of your pregnancy. Ask for help if you have counseling or nutritional needs during pregnancy. Your health care provider can offer advice or refer you to specialists for help with various needs. Do not use hot tubs, steam rooms, or saunas. Do not douche or use tampons or scented sanitary pads. Do not cross your legs for long periods of time. Avoid cat litter boxes and soil used by cats. These carry germs that can cause birth defects in the baby and possibly loss of the fetus by miscarriage or stillbirth. Avoid all smoking, herbs, alcohol, and medicines not prescribed by your health care provider. Chemicals in these affect the formation and growth of the baby. Schedule a dentist appointment. At home, brush your teeth with a soft toothbrush and be gentle when you floss. SEEK MEDICAL CARE IF:   You have dizziness. You have mild pelvic cramps, pelvic pressure, or nagging pain in the abdominal area. You have persistent nausea, vomiting, or diarrhea. You have a bad smelling vaginal discharge. You have pain with urination. You notice increased swelling in your face, hands, legs, or ankles. SEEK IMMEDIATE MEDICAL CARE IF:  You have a fever. You are leaking fluid from your vagina. You have spotting or bleeding from your vagina. You have severe abdominal cramping or pain. You have rapid weight gain or loss. You vomit blood or material that looks like coffee grounds. You are exposed to [place] measles and have never had them. You are exposed to fifth disease or chickenpox. You develop a severe headache. You have shortness of breath. You have any kind of trauma, such as from a fall or a car accident. Document Released: 12/04/2001 Document Revised: 04/26/2014 Document Reviewed: 10/20/2013 [facility] Patient Information 2015 [place], [place]. This information is not intended to replace advice given to you by your health care provider. Make sure you discuss any questions you have with your health care provider.

## 2023-07-23 NOTE — Progress Notes (Signed)
[place] 12+2 wks,measurements c/w dates,NB present,NT 1.7 mm,FHR 158 bpm,normal ovaries,CRL 67.70 mm

## 2023-07-25 DIAGNOSIS — Z419 Encounter for procedure for purposes other than remedying health state, unspecified: Secondary | ICD-10-CM | POA: Diagnosis not present

## 2023-07-29 DIAGNOSIS — M9902 Segmental and somatic dysfunction of thoracic region: Secondary | ICD-10-CM | POA: Diagnosis not present

## 2023-07-29 DIAGNOSIS — M9905 Segmental and somatic dysfunction of pelvic region: Secondary | ICD-10-CM | POA: Diagnosis not present

## 2023-07-29 DIAGNOSIS — M5442 Lumbago with sciatica, left side: Secondary | ICD-10-CM | POA: Diagnosis not present

## 2023-07-29 DIAGNOSIS — M9903 Segmental and somatic dysfunction of lumbar region: Secondary | ICD-10-CM | POA: Diagnosis not present

## 2023-08-05 DIAGNOSIS — M5442 Lumbago with sciatica, left side: Secondary | ICD-10-CM | POA: Diagnosis not present

## 2023-08-05 DIAGNOSIS — M9903 Segmental and somatic dysfunction of lumbar region: Secondary | ICD-10-CM | POA: Diagnosis not present

## 2023-08-05 DIAGNOSIS — M9902 Segmental and somatic dysfunction of thoracic region: Secondary | ICD-10-CM | POA: Diagnosis not present

## 2023-08-05 DIAGNOSIS — M9905 Segmental and somatic dysfunction of pelvic region: Secondary | ICD-10-CM | POA: Diagnosis not present

## 2023-08-12 DIAGNOSIS — M5442 Lumbago with sciatica, left side: Secondary | ICD-10-CM | POA: Diagnosis not present

## 2023-08-12 DIAGNOSIS — M9905 Segmental and somatic dysfunction of pelvic region: Secondary | ICD-10-CM | POA: Diagnosis not present

## 2023-08-12 DIAGNOSIS — M9903 Segmental and somatic dysfunction of lumbar region: Secondary | ICD-10-CM | POA: Diagnosis not present

## 2023-08-12 DIAGNOSIS — M9902 Segmental and somatic dysfunction of thoracic region: Secondary | ICD-10-CM | POA: Diagnosis not present

## 2023-08-19 DIAGNOSIS — M5442 Lumbago with sciatica, left side: Secondary | ICD-10-CM | POA: Diagnosis not present

## 2023-08-19 DIAGNOSIS — M9905 Segmental and somatic dysfunction of pelvic region: Secondary | ICD-10-CM | POA: Diagnosis not present

## 2023-08-19 DIAGNOSIS — M9902 Segmental and somatic dysfunction of thoracic region: Secondary | ICD-10-CM | POA: Diagnosis not present

## 2023-08-19 DIAGNOSIS — M9903 Segmental and somatic dysfunction of lumbar region: Secondary | ICD-10-CM | POA: Diagnosis not present

## 2023-08-20 ENCOUNTER — Ambulatory Visit (INDEPENDENT_AMBULATORY_CARE_PROVIDER_SITE_OTHER): Payer: Medicaid Other | Admitting: Women's Health

## 2023-08-20 ENCOUNTER — Encounter: Payer: Self-pay | Admitting: Women's Health

## 2023-08-20 VITALS — BP 114/73 | HR 107 | Wt 179.8 lb

## 2023-08-20 DIAGNOSIS — Z1379 Encounter for other screening for genetic and chromosomal anomalies: Secondary | ICD-10-CM | POA: Diagnosis not present

## 2023-08-20 DIAGNOSIS — Z3A16 16 weeks gestation of pregnancy: Secondary | ICD-10-CM

## 2023-08-20 DIAGNOSIS — Z3482 Encounter for supervision of other normal pregnancy, second trimester: Secondary | ICD-10-CM

## 2023-08-20 DIAGNOSIS — Z3480 Encounter for supervision of other normal pregnancy, unspecified trimester: Secondary | ICD-10-CM

## 2023-08-20 DIAGNOSIS — Z363 Encounter for antenatal screening for malformations: Secondary | ICD-10-CM

## 2023-08-20 NOTE — Patient Instructions (Addendum)
Catherine Park, thank you for choosing our office today! We appreciate the opportunity to meet your healthcare needs. You may receive a short survey by mail, e-mail, or through [employer]. If you are happy with your care we would appreciate if you could take just a few minutes to complete the survey questions. We read all of your comments and take your feedback very seriously. Thank you again for choosing our office.  Center for [employer] Team at [facility] at [facility] ([address] [place], [place] [address]) Entrance C, located off of [address] Free 24/7 valet parking  Go to [email or web address] to register for FREE online childbirth classes  Call the office [phone]) or go to [facility] if: You begin to severe cramping Your water breaks.  Sometimes it is a big gush of fluid, sometimes it is just a trickle that keeps getting your panties wet or running down your legs You have vaginal bleeding.  It is normal to have a small amount of spotting if your cervix was checked.   [facility] Pediatricians/Family Doctors [facility]): [address] Colette Ribas, [phone]           [facility]: [address], [phone]                [facility]): [address], [phone] (call to ask if accepting patients) [facility] Department: [address], [place], [phone]    [facility] Pediatricians/Family Doctors [facility]): [phone], [phone] [facility]: [address] [place], [phone] [facility]: [address], [phone]  [facility] Doctors  [facility]): [phone] [facility]: [address], [phone]   [facility] Doctors [facility]: [address] [phone]  [facility] Doctors  [facility]: [phone], [phone]  Home Blood Pressure Monitoring for Patients   Your provider has recommended that you check your blood pressure (BP) at least once a week at home. If you do not have a blood pressure cuff at home, one will be provided for you. Contact your provider if you have not received your monitor within 1 week.   Helpful Tips for Accurate Home Blood Pressure Checks  Don't smoke, exercise, or drink caffeine 30 minutes before checking your BP Use the restroom before checking your BP (a full bladder can raise your pressure) Relax in a comfortable upright chair Feet on the ground Left arm resting comfortably on a flat surface at the level of your heart Legs uncrossed Back supported Sit quietly and don't talk Place the cuff on your bare arm Adjust snuggly, so that only two fingertips can fit between your skin and the top of the cuff Check 2 readings separated by at least one minute Keep a log of your BP readings For a visual, please reference this diagram: http://ccnc.care/bpdiagram  Provider Name: [facility]     Phone: [phone]  Zone 1: ALL CLEAR  Continue to monitor your symptoms:  BP reading is less than 140 (top number) or less than 90 (bottom number)  No right upper stomach pain No headaches or seeing spots No feeling nauseated or throwing up No swelling in face and hands  Zone 2: CAUTION Call your doctor's office for any of the following:  BP reading is greater than 140 (top number) or greater than  90 (bottom number)  Stomach pain under your ribs in the middle or right side Headaches or seeing spots Feeling nauseated or throwing up Swelling in face and hands  Zone 3: EMERGENCY  Seek immediate medical care if you have any of the following:  BP reading is greater than160 (top number) or greater than 110 (bottom number) Severe headaches not improving with Tylenol Serious difficulty catching your breath Any worsening symptoms from  Zone 2     Second Trimester of Pregnancy The second trimester is from week 14 through week 27 (months 4 through 6). The second trimester is often a time when you feel your best. Your body has adjusted to being pregnant, and you begin to feel better physically. Usually, morning sickness has lessened or quit completely, you may have more energy, and you may have an increase in appetite. The second trimester is also a time when the fetus is growing rapidly. At the end of the sixth month, the fetus is about 9 inches long and weighs about 1 pounds. You will likely begin to feel the baby move (quickening) between 16 and 20 weeks of pregnancy. Body changes during your second trimester Your body continues to go through many changes during your second trimester. The changes vary from woman to woman. Your weight will continue to increase. You will notice your lower abdomen bulging out. You may begin to get stretch marks on your hips, abdomen, and breasts. You may develop headaches that can be relieved by medicines. The medicines should be approved by your health care provider. You may urinate more often because the fetus is pressing on your bladder. You may develop or continue to have heartburn as a result of your pregnancy. You may develop constipation because certain hormones are causing the muscles that push waste through your intestines to slow down. You may develop hemorrhoids or swollen, bulging veins (varicose veins). You may have back pain. This is caused by: Weight gain. Pregnancy hormones that are relaxing the joints in your pelvis. A shift in weight and the muscles that support your balance. Your breasts will continue to grow and they will continue to become tender. Your gums may bleed and may be sensitive to brushing and flossing. Dark spots or blotches (chloasma, mask of pregnancy) may develop on your face. This will likely fade after the baby is born. A dark line from your belly button to  the pubic area (linea nigra) may appear. This will likely fade after the baby is born. You may have changes in your hair. These can include thickening of your hair, rapid growth, and changes in texture. Some women also have hair loss during or after pregnancy, or hair that feels dry or thin. Your hair will most likely return to normal after your baby is born.  What to expect at prenatal visits During a routine prenatal visit: You will be weighed to make sure you and the fetus are growing normally. Your blood pressure will be taken. Your abdomen will be measured to track your baby's growth. The fetal heartbeat will be listened to. Any test results from the previous visit will be discussed.  Your health care provider may ask you: How you are feeling. If you are feeling the baby move. If you have had any abnormal symptoms, such as leaking fluid, bleeding, severe headaches, or abdominal cramping. If you are using any tobacco products, including cigarettes, chewing tobacco, and electronic cigarettes. If you have any questions.  Other tests that may be performed during  your second trimester include: Blood tests that check for: Low iron levels (anemia). High blood sugar that affects pregnant women (gestational diabetes) between 6 and 28 weeks. Rh antibodies. This is to check for a protein on red blood cells (Rh factor). Urine tests to check for infections, diabetes, or protein in the urine. An ultrasound to confirm the proper growth and development of the baby. An amniocentesis to check for possible genetic problems. Fetal screens for spina bifida and Down syndrome. HIV (human immunodeficiency virus) testing. Routine prenatal testing includes screening for HIV, unless you choose not to have this test.  Follow these instructions at home: Medicines Follow your health care provider's instructions regarding medicine use. Specific medicines may be either safe or unsafe to take during  pregnancy. Take a prenatal vitamin that contains at least 600 micrograms (mcg) of folic acid. If you develop constipation, try taking a stool softener if your health care provider approves. Eating and drinking Eat a balanced diet that includes fresh fruits and vegetables, whole grains, good sources of protein such as meat, eggs, or tofu, and low-fat dairy. Your health care provider will help you determine the amount of weight gain that is right for you. Avoid raw meat and uncooked cheese. These carry germs that can cause birth defects in the baby. If you have low calcium intake from food, talk to your health care provider about whether you should take a daily calcium supplement. Limit foods that are high in fat and processed sugars, such as fried and sweet foods. To prevent constipation: Drink enough fluid to keep your urine clear or pale yellow. Eat foods that are high in fiber, such as fresh fruits and vegetables, whole grains, and beans. Activity Exercise only as directed by your health care provider. Most women can continue their usual exercise routine during pregnancy. Try to exercise for 30 minutes at least 5 days a week. Stop exercising if you experience uterine contractions. Avoid heavy lifting, wear low heel shoes, and practice good posture. A sexual relationship may be continued unless your health care provider directs you otherwise. Relieving pain and discomfort Wear a good support bra to prevent discomfort from breast tenderness. Take warm sitz baths to soothe any pain or discomfort caused by hemorrhoids. Use hemorrhoid cream if your health care provider approves. Rest with your legs elevated if you have leg cramps or low back pain. If you develop varicose veins, wear support hose. Elevate your feet for 15 minutes, 3-4 times a day. Limit salt in your diet. Prenatal Care Write down your questions. Take them to your prenatal visits. Keep all your prenatal visits as told by your health  care provider. This is important. Safety Wear your seat belt at all times when driving. Make a list of emergency phone numbers, including numbers for family, friends, the hospital, and police and fire departments. General instructions Ask your health care provider for a referral to a local prenatal education class. Begin classes no later than the beginning of month 6 of your pregnancy. Ask for help if you have counseling or nutritional needs during pregnancy. Your health care provider can offer advice or refer you to specialists for help with various needs. Do not use hot tubs, steam rooms, or saunas. Do not douche or use tampons or scented sanitary pads. Do not cross your legs for long periods of time. Avoid cat litter boxes and soil used by cats. These carry germs that can cause birth defects in the baby and possibly loss of the  fetus by miscarriage or stillbirth. Avoid all smoking, herbs, alcohol, and unprescribed drugs. Chemicals in these products can affect the formation and growth of the baby. Do not use any products that contain nicotine or tobacco, such as cigarettes and e-cigarettes. If you need help quitting, ask your health care provider. Visit your dentist if you have not gone yet during your pregnancy. Use a soft toothbrush to brush your teeth and be gentle when you floss. Contact a health care provider if: You have dizziness. You have mild pelvic cramps, pelvic pressure, or nagging pain in the abdominal area. You have persistent nausea, vomiting, or diarrhea. You have a bad smelling vaginal discharge. You have pain when you urinate. Get help right away if: You have a fever. You are leaking fluid from your vagina. You have spotting or bleeding from your vagina. You have severe abdominal cramping or pain. You have rapid weight gain or weight loss. You have shortness of breath with chest pain. You notice sudden or extreme swelling of your face, hands, ankles, feet, or legs. You  have not felt your baby move in over an hour. You have severe headaches that do not go away when you take medicine. You have vision changes. Summary The second trimester is from week 14 through week 27 (months 4 through 6). It is also a time when the fetus is growing rapidly. Your body goes through many changes during pregnancy. The changes vary from woman to woman. Avoid all smoking, herbs, alcohol, and unprescribed drugs. These chemicals affect the formation and growth your baby. Do not use any tobacco products, such as cigarettes, chewing tobacco, and e-cigarettes. If you need help quitting, ask your health care provider. Contact your health care provider if you have any questions. Keep all prenatal visits as told by your health care provider. This is important. This information is not intended to replace advice given to you by your health care provider. Make sure you discuss any questions you have with your health care provider. Document Released: 12/04/2001 Document Revised: 05/17/2016 Document Reviewed: 02/10/2013 Elsevier Interactive Patient Education  2017 [employer].    Considering [place]? Guide for patients at Center for [employer] [facility]) Why consider waterbirth? Gentle birth for babies  Less pain medicine used in labor  May allow for passive descent/less pushing  May reduce perineal tears  More mobility and instinctive maternal position changes  Increased maternal relaxation   Is waterbirth safe? What are the risks of infection, drowning or other complications? Infection:  Very low risk (3.7 % for tub vs 4.8% for bed)  7 in 8000 waterbirths with documented infection  Poorly cleaned equipment most common cause  Slightly lower group B strep transmission rate  Drowning  Maternal:  Very low risk  Related to seizures or fainting  Newborn:  Very low risk. No evidence of increased risk of respiratory problems in multiple large studies  Physiological protection  from breathing under water  Avoid underwater birth if there are any fetal complications  Once baby's head is out of the water, keep it out.  Birth complication  Some reports of cord trauma, but risk decreased by bringing baby to surface gradually  No evidence of increased risk of shoulder dystocia. Mothers can usually change positions faster in water than in a bed, possibly aiding the maneuvers to free the shoulder.   There are 2 things you MUST do to have a waterbirth with [facility]: Attend a waterbirth class at [employer] [facility] at [facility]  3rd Wednesday of every month from 7-9 pm (virtual during COVID) [employer] at [email or web address] or [email or web address] or by calling [phone] Bring [place] the certificate from the class to your prenatal appointment or send via MyChart Meet with a midwife at 36 weeks* to see if you can still plan a waterbirth and to sign the consent.   *We also recommend that you schedule as many of your prenatal visits with a midwife as possible.    Helpful information: You may want to bring a bathing suit top to the hospital to wear during labor but this is optional.  All other supplies are provided by the hospital. Please arrive at the hospital with signs of active labor, and do not wait at home until late in labor. It takes 45 min- 1 hour for fetal monitoring, and check in to your room to take place, plus transport and filling of the waterbirth tub.    Things that would prevent you from having a waterbirth: Premature, <37wks  Previous cesarean birth  Presence of thick meconium-stained fluid  Multiple gestation (Twins, triplets, etc.)  Uncontrolled diabetes or gestational diabetes requiring medication  Hypertension diagnosed in pregnancy or preexisting hypertension (gestational hypertension, preeclampsia, or chronic hypertension) Fetal growth restriction (your baby measures less than 10th percentile on ultrasound) Heavy vaginal  bleeding  Non-reassuring fetal heart rate  Active infection (MRSA, etc.). Group B Strep is NOT a contraindication for waterbirth.  If your labor has to be induced and induction method requires continuous monitoring of the baby's heart rate  Other risks/issues identified by your obstetrical provider   Please remember that birth is unpredictable. Under certain unforeseeable circumstances your provider may advise against giving birth in the tub. These decisions will be made on a case-by-case basis and with the safety of you and your baby as our highest priority.    Updated 03/28/22

## 2023-08-20 NOTE — Progress Notes (Signed)
LOW-RISK PREGNANCY VISIT Patient name: Catherine Park MRN [id number]  Date of birth: 21-Sep-1996 Chief Complaint:   Routine Prenatal Visit (IT2 Today)  History of Present Illness:   Catherine Park is a 27 y.o. G37P1001 female at [redacted]w[redacted]d with an Estimated Date of Delivery: 02/02/24 being seen today for ongoing management of a low-risk pregnancy.   Today she reports no complaints. Interested in Systems developer.  Contractions: Not present. Vag. Bleeding: None.  Movement: Present. denies leaking of fluid.     07/23/2023   10:42 AM 08/17/2021    9:08 AM 05/10/2021    3:15 PM 10/07/2019   11:27 AM  Depression screen PHQ 2/9  Decreased Interest 0 0 0 0  Down, Depressed, Hopeless 0 0 0 0  PHQ - 2 Score 0 0 0 0  Altered sleeping 0 1 1   Tired, decreased energy 0 1 0   Change in appetite 0 1 1   Feeling bad or failure about yourself  0 0 0   Trouble concentrating 0 1 1   Moving slowly or fidgety/restless 0 0 0   Suicidal thoughts 0 0 0   PHQ-9 Score 0 4 3         07/23/2023   10:42 AM 08/17/2021    9:08 AM 05/10/2021    3:16 PM  GAD 7 : Generalized Anxiety Score  Nervous, Anxious, on Edge 0 0 1  Control/stop worrying 0 0 1  Worry too much - different things 0 0 1  Trouble relaxing 0 0 0  Restless 0 0 1  Easily annoyed or irritable 0 0 1  Afraid - awful might happen 0 0 0  Total GAD 7 Score 0 0 5      Review of Systems:   Pertinent items are noted in HPI Denies abnormal vaginal discharge w/ itching/odor/irritation, headaches, visual changes, shortness of breath, chest pain, abdominal pain, severe nausea/vomiting, or problems with urination or bowel movements unless otherwise stated above. Pertinent History Reviewed:  Reviewed past medical,surgical, social, obstetrical and family history.  Reviewed problem list, medications and allergies. Physical Assessment:   Vitals:   08/20/23 1530  BP: 114/73  Pulse: (!) 107  Weight: 179 lb 12.8 oz (81.6 kg)  Body mass index is  32.89 kg/m.        Physical Examination:   General appearance: Well appearing, and in no distress  Mental status: Alert, oriented to person, place, and time  Skin: Warm & dry  Cardiovascular: Normal heart rate noted  Respiratory: Normal respiratory effort, no distress  Abdomen: Soft, gravid, nontender  Pelvic: Cervical exam deferred         Extremities: Edema: None  Fetal Status: Fetal Heart Rate (bpm): 150   Movement: Present    Chaperone: N/A   No results found for this or any previous visit (from the past 24 hour(s)).  Assessment & Plan:  1) Low-risk pregnancy G2P1001 at [redacted]w[redacted]d with an Estimated Date of Delivery: 02/02/24   2) Interested in Systems developer, take class, send certificate. Printed info given   Meds: No orders of the defined types were placed in this encounter.  Labs/procedures today: 2nd IT  Plan:  Continue routine obstetrical care  Next visit: prefers will be in person for u/s     Reviewed: Preterm labor symptoms and general obstetric precautions including but not limited to vaginal bleeding, contractions, leaking of fluid and fetal movement were reviewed in detail with the patient.  All questions were answered. Does have home bp  cuff. Office bp cuff given: not applicable. Check bp weekly, let us know if consistently >140 and/or >90.  Follow-up: Return for As scheduled.  Future Appointments  Date Time Provider Department Center  09/16/2023  3:15 PM [facility] [place] [facility] None  09/16/2023  4:10 PM Cheral Marker, CNM [facility]    Orders Placed This Encounter  Procedures   US OB Comp + 14 Wk   INTEGRATED 2   Cheral Marker CNM, [facility] 08/20/2023 3:45 PM

## 2023-08-22 LAB — INTEGRATED 2
AFP MoM: 0.67
Alpha-Fetoprotein: 19.4 ng/mL
Crown Rump Length: 64.7 mm
DIA MoM: 1.13
DIA Value: 161.2 pg/mL
Estriol, Unconjugated: 0.66 ng/mL
Gest. Age on Collection Date: 12.6 wk
Gestational Age: 16.6 wk
Maternal Age at EDD: 27.5 a
Nuchal Translucency (NT): 1.7 mm
Nuchal Translucency MoM: 1.17
Number of Fetuses: 1
PAPP-A MoM: 0.68
PAPP-A Value: 546.4 ng/mL
Test Results:: NEGATIVE
Weight: 184 [lb_av]
Weight: 184 [lb_av]
hCG MoM: 1.21
hCG Value: 34.2 [IU]/mL
uE3 MoM: 0.74

## 2023-08-25 DIAGNOSIS — Z419 Encounter for procedure for purposes other than remedying health state, unspecified: Secondary | ICD-10-CM | POA: Diagnosis not present

## 2023-09-16 ENCOUNTER — Encounter: Payer: Self-pay | Admitting: Women's Health

## 2023-09-16 ENCOUNTER — Ambulatory Visit (INDEPENDENT_AMBULATORY_CARE_PROVIDER_SITE_OTHER): Payer: Medicaid Other | Admitting: Women's Health

## 2023-09-16 ENCOUNTER — Ambulatory Visit (INDEPENDENT_AMBULATORY_CARE_PROVIDER_SITE_OTHER): Payer: Medicaid Other

## 2023-09-16 VITALS — BP 117/74 | HR 87 | Wt 183.0 lb

## 2023-09-16 DIAGNOSIS — Z3482 Encounter for supervision of other normal pregnancy, second trimester: Secondary | ICD-10-CM

## 2023-09-16 DIAGNOSIS — M5442 Lumbago with sciatica, left side: Secondary | ICD-10-CM | POA: Diagnosis not present

## 2023-09-16 DIAGNOSIS — Z363 Encounter for antenatal screening for malformations: Secondary | ICD-10-CM

## 2023-09-16 DIAGNOSIS — Z3480 Encounter for supervision of other normal pregnancy, unspecified trimester: Secondary | ICD-10-CM

## 2023-09-16 DIAGNOSIS — Z3A20 20 weeks gestation of pregnancy: Secondary | ICD-10-CM

## 2023-09-16 DIAGNOSIS — M9903 Segmental and somatic dysfunction of lumbar region: Secondary | ICD-10-CM | POA: Diagnosis not present

## 2023-09-16 DIAGNOSIS — M9902 Segmental and somatic dysfunction of thoracic region: Secondary | ICD-10-CM | POA: Diagnosis not present

## 2023-09-16 DIAGNOSIS — M9905 Segmental and somatic dysfunction of pelvic region: Secondary | ICD-10-CM | POA: Diagnosis not present

## 2023-09-16 DIAGNOSIS — Z362 Encounter for other antenatal screening follow-up: Secondary | ICD-10-CM

## 2023-09-16 NOTE — Patient Instructions (Addendum)
Catherine Park, thank you for choosing our office today! We appreciate the opportunity to meet your healthcare needs. You may receive a short survey by mail, e-mail, or through [employer]. If you are happy with your care we would appreciate if you could take just a few minutes to complete the survey questions. We read all of your comments and take your feedback very seriously. Thank you again for choosing our office.  [facility] [employer] Team at [facility] at [facility] ([address] [place], [place] [address]) Entrance C, located off of [address] Free 24/7 valet parking  Go to [email or web address] to register for FREE online childbirth classes  Call the office [phone]) or go to [facility] if: You begin to severe cramping Your water breaks.  Sometimes it is a big gush of fluid, sometimes it is just a trickle that keeps getting your panties wet or running down your legs You have vaginal bleeding.  It is normal to have a small amount of spotting if your cervix was checked.   [facility] Pediatricians/Family Doctors [facility]): [address] Colette Ribas, [phone]           [facility]: [address], [phone]                [facility]): [address], [phone] (call to ask if accepting patients) [facility] Department: [address], [place], [phone]    [facility] Pediatricians/Family Doctors [facility]): [phone], [phone] [facility]: [address] [place], [phone] [facility]: [address], [phone]  [facility] Doctors  [facility]): [phone] [facility]: [address], [phone]   [facility]: [address] [phone]  [facility] Doctors  [facility]: [phone], [phone]  Home Blood Pressure Monitoring for Patients   Your provider has recommended that you check your blood pressure (BP) at least once a week at home. If you do not have a blood pressure cuff at home, one will be provided for you. Contact your provider if you have not received your monitor within 1 week.   Helpful Tips for Accurate Home Blood Pressure Checks  Don't smoke, exercise, or drink caffeine 30 minutes before checking your BP Use the restroom before checking your BP (a full bladder can raise your pressure) Relax in a comfortable upright chair Feet on the ground Left arm resting comfortably on a flat surface at the level of your heart Legs uncrossed Back supported Sit quietly and don't talk Place the cuff on your bare arm Adjust snuggly, so that only two fingertips can fit between your skin and the top of the cuff Check 2 readings separated by at least one minute Keep a log of your BP readings For a visual, please reference this diagram: http://ccnc.care/bpdiagram  Provider Name: [facility]     Phone: [phone]  Zone 1: ALL CLEAR  Continue to monitor your symptoms:  BP reading is less than 140 (top number) or less than 90 (bottom number)  No right upper stomach pain No headaches or seeing spots No feeling nauseated or throwing up No swelling in face and hands  Zone 2: CAUTION Call your doctor's office for any of the following:  BP reading is greater than 140 (top number) or greater than  90 (bottom number)  Stomach pain under your ribs in the middle or right side Headaches or seeing spots Feeling nauseated or throwing up Swelling in face and hands  Zone 3: EMERGENCY  Seek immediate medical care if you have any of the following:  BP reading is greater than160 (top number) or greater than 110 (bottom number) Severe headaches not improving with Tylenol Serious difficulty catching your breath Any worsening symptoms from  Zone 2     Second Trimester of Pregnancy The second trimester is from week 14 through week 27 (months 4 through 6). The second trimester is often a time when you feel your best. Your body has adjusted to being pregnant, and you begin to feel better physically. Usually, morning sickness has lessened or quit completely, you may have more energy, and you may have an increase in appetite. The second trimester is also a time when the fetus is growing rapidly. At the end of the sixth month, the fetus is about 9 inches long and weighs about 1 pounds. You will likely begin to feel the baby move (quickening) between 16 and 20 weeks of pregnancy. Body changes during your second trimester Your body continues to go through many changes during your second trimester. The changes vary from woman to woman. Your weight will continue to increase. You will notice your lower abdomen bulging out. You may begin to get stretch marks on your hips, abdomen, and breasts. You may develop headaches that can be relieved by medicines. The medicines should be approved by your health care provider. You may urinate more often because the fetus is pressing on your bladder. You may develop or continue to have heartburn as a result of your pregnancy. You may develop constipation because certain hormones are causing the muscles that push waste through your intestines to slow down. You may develop hemorrhoids or swollen, bulging veins (varicose veins). You may have back pain. This is caused by: Weight gain. Pregnancy hormones that are relaxing the joints in your pelvis. A shift in weight and the muscles that support your balance. Your breasts will continue to grow and they will continue to become tender. Your gums may bleed and may be sensitive to brushing and flossing. Dark spots or blotches (chloasma, mask of pregnancy) may develop on your face. This will likely fade after the baby is born. A dark line from your belly button to  the pubic area (linea nigra) may appear. This will likely fade after the baby is born. You may have changes in your hair. These can include thickening of your hair, rapid growth, and changes in texture. Some women also have hair loss during or after pregnancy, or hair that feels dry or thin. Your hair will most likely return to normal after your baby is born.  What to expect at prenatal visits During a routine prenatal visit: You will be weighed to make sure you and the fetus are growing normally. Your blood pressure will be taken. Your abdomen will be measured to track your baby's growth. The fetal heartbeat will be listened to. Any test results from the previous visit will be discussed.  Your health care provider may ask you: How you are feeling. If you are feeling the baby move. If you have had any abnormal symptoms, such as leaking fluid, bleeding, severe headaches, or abdominal cramping. If you are using any tobacco products, including cigarettes, chewing tobacco, and electronic cigarettes. If you have any questions.  Other tests that may be performed during  your second trimester include: Blood tests that check for: Low iron levels (anemia). High blood sugar that affects pregnant women (gestational diabetes) between 26 and 28 weeks. Rh antibodies. This is to check for a protein on red blood cells (Rh factor). Urine tests to check for infections, diabetes, or protein in the urine. An ultrasound to confirm the proper growth and development of the baby. An amniocentesis to check for possible genetic problems. Fetal screens for spina bifida and Down syndrome. HIV (human immunodeficiency virus) testing. Routine prenatal testing includes screening for HIV, unless you choose not to have this test.  Follow these instructions at home: Medicines Follow your health care provider's instructions regarding medicine use. Specific medicines may be either safe or unsafe to take during  pregnancy. Take a prenatal vitamin that contains at least 600 micrograms (mcg) of folic acid. If you develop constipation, try taking a stool softener if your health care provider approves. Eating and drinking Eat a balanced diet that includes fresh fruits and vegetables, whole grains, good sources of protein such as meat, eggs, or tofu, and low-fat dairy. Your health care provider will help you determine the amount of weight gain that is right for you. Avoid raw meat and uncooked cheese. These carry germs that can cause birth defects in the baby. If you have low calcium intake from food, talk to your health care provider about whether you should take a daily calcium supplement. Limit foods that are high in fat and processed sugars, such as fried and sweet foods. To prevent constipation: Drink enough fluid to keep your urine clear or pale yellow. Eat foods that are high in fiber, such as fresh fruits and vegetables, whole grains, and beans. Activity Exercise only as directed by your health care provider. Most women can continue their usual exercise routine during pregnancy. Try to exercise for 30 minutes at least 5 days a week. Stop exercising if you experience uterine contractions. Avoid heavy lifting, wear low heel shoes, and practice good posture. A sexual relationship may be continued unless your health care provider directs you otherwise. Relieving pain and discomfort Wear a good support bra to prevent discomfort from breast tenderness. Take warm sitz baths to soothe any pain or discomfort caused by hemorrhoids. Use hemorrhoid cream if your health care provider approves. Rest with your legs elevated if you have leg cramps or low back pain. If you develop varicose veins, wear support hose. Elevate your feet for 15 minutes, 3-4 times a day. Limit salt in your diet. Prenatal Care Write down your questions. Take them to your prenatal visits. Keep all your prenatal visits as told by your health  care provider. This is important. Safety Wear your seat belt at all times when driving. Make a list of emergency phone numbers, including numbers for family, friends, the hospital, and police and fire departments. General instructions Ask your health care provider for a referral to a local prenatal education class. Begin classes no later than the beginning of month 6 of your pregnancy. Ask for help if you have counseling or nutritional needs during pregnancy. Your health care provider can offer advice or refer you to specialists for help with various needs. Do not use hot tubs, steam rooms, or saunas. Do not douche or use tampons or scented sanitary pads. Do not cross your legs for long periods of time. Avoid cat litter boxes and soil used by cats. These carry germs that can cause birth defects in the baby and possibly loss of the  fetus by miscarriage or stillbirth. Avoid all smoking, herbs, alcohol, and unprescribed drugs. Chemicals in these products can affect the formation and growth of the baby. Do not use any products that contain nicotine or tobacco, such as cigarettes and e-cigarettes. If you need help quitting, ask your health care provider. Visit your dentist if you have not gone yet during your pregnancy. Use a soft toothbrush to brush your teeth and be gentle when you floss. Contact a health care provider if: You have dizziness. You have mild pelvic cramps, pelvic pressure, or nagging pain in the abdominal area. You have persistent nausea, vomiting, or diarrhea. You have a bad smelling vaginal discharge. You have pain when you urinate. Get help right away if: You have a fever. You are leaking fluid from your vagina. You have spotting or bleeding from your vagina. You have severe abdominal cramping or pain. You have rapid weight gain or weight loss. You have shortness of breath with chest pain. You notice sudden or extreme swelling of your face, hands, ankles, feet, or legs. You  have not felt your baby move in over an hour. You have severe headaches that do not go away when you take medicine. You have vision changes. Summary The second trimester is from week 14 through week 27 (months 4 through 6). It is also a time when the fetus is growing rapidly. Your body goes through many changes during pregnancy. The changes vary from woman to woman. Avoid all smoking, herbs, alcohol, and unprescribed drugs. These chemicals affect the formation and growth your baby. Do not use any tobacco products, such as cigarettes, chewing tobacco, and e-cigarettes. If you need help quitting, ask your health care provider. Contact your health care provider if you have any questions. Keep all prenatal visits as told by your health care provider. This is important. This information is not intended to replace advice given to you by your health care provider. Make sure you discuss any questions you have with your health care provider. Document Released: 12/04/2001 Document Revised: 05/17/2016 Document Reviewed: 02/10/2013 Elsevier Interactive Patient Education  2017 [employer].    Considering [place]? Guide for patients at [facility] for [employer] [facility]) Why consider waterbirth? Gentle birth for babies  Less pain medicine used in labor  May allow for passive descent/less pushing  May reduce perineal tears  More mobility and instinctive maternal position changes  Increased maternal relaxation   Is waterbirth safe? What are the risks of infection, drowning or other complications? Infection:  Very low risk (3.7 % for tub vs 4.8% for bed)  7 in 8000 waterbirths with documented infection  Poorly cleaned equipment most common cause  Slightly lower group B strep transmission rate  Drowning  Maternal:  Very low risk  Related to seizures or fainting  Newborn:  Very low risk. No evidence of increased risk of respiratory problems in multiple large studies  Physiological protection  from breathing under water  Avoid underwater birth if there are any fetal complications  Once baby's head is out of the water, keep it out.  Birth complication  Some reports of cord trauma, but risk decreased by bringing baby to surface gradually  No evidence of increased risk of shoulder dystocia. Mothers can usually change positions faster in water than in a bed, possibly aiding the maneuvers to free the shoulder.   There are 2 things you MUST do to have a waterbirth with [facility]: Attend a waterbirth class at [employer] [facility] at [facility]  3rd Wednesday of every month from 7-9 pm (virtual during COVID) [employer] at [email or web address] or [email or web address] or by calling [phone] Bring [place] the certificate from the class to your prenatal appointment or send via MyChart Meet with a midwife at 36 weeks* to see if you can still plan a waterbirth and to sign the consent.   *We also recommend that you schedule as many of your prenatal visits with a midwife as possible.    Helpful information: You may want to bring a bathing suit top to the hospital to wear during labor but this is optional.  All other supplies are provided by the hospital. Please arrive at the hospital with signs of active labor, and do not wait at home until late in labor. It takes 45 min- 1 hour for fetal monitoring, and check in to your room to take place, plus transport and filling of the waterbirth tub.    Things that would prevent you from having a waterbirth: Premature, <37wks  Previous cesarean birth  Presence of thick meconium-stained fluid  Multiple gestation (Twins, triplets, etc.)  Uncontrolled diabetes or gestational diabetes requiring medication  Hypertension diagnosed in pregnancy or preexisting hypertension (gestational hypertension, preeclampsia, or chronic hypertension) Fetal growth restriction (your baby measures less than 10th percentile on ultrasound) Heavy vaginal  bleeding  Non-reassuring fetal heart rate  Active infection (MRSA, etc.). Group B Strep is NOT a contraindication for waterbirth.  If your labor has to be induced and induction method requires continuous monitoring of the baby's heart rate  Other risks/issues identified by your obstetrical provider   Please remember that birth is unpredictable. Under certain unforeseeable circumstances your provider may advise against giving birth in the tub. These decisions will be made on a case-by-case basis and with the safety of you and your baby as our highest priority.    Updated 03/28/22

## 2023-09-16 NOTE — Progress Notes (Signed)
[place] 20+1 wks,breech,anterior placenta gr 0,cx  4.6 cm,normal ovaries,FHR 154 bpm,svp of fluid 5.8 cm,EFW 335 g 45%,no obvious abnormalities

## 2023-09-16 NOTE — Progress Notes (Signed)
LOW-RISK PREGNANCY VISIT Patient name: Catherine Park MRN [id number]  Date of birth: 1996/04/06 Chief Complaint:   Routine Prenatal Visit (Ultrasound today)  History of Present Illness:   Catherine Park is a 27 y.o. G50P1001 female at [redacted]w[redacted]d with an Estimated Date of Delivery: 02/02/24 being seen today for ongoing management of a low-risk pregnancy.   Today she reports  spotting Sat after sex, none since . Denies abnormal discharge, itching/odor/irritation.   Contractions: Not present. Vag. Bleeding: Scant (pink when wiped on Saturday, none since).  Movement: Absent. denies leaking of fluid.     07/23/2023   10:42 AM 08/17/2021    9:08 AM 05/10/2021    3:15 PM 10/07/2019   11:27 AM  Depression screen PHQ 2/9  Decreased Interest 0 0 0 0  Down, Depressed, Hopeless 0 0 0 0  PHQ - 2 Score 0 0 0 0  Altered sleeping 0 1 1   Tired, decreased energy 0 1 0   Change in appetite 0 1 1   Feeling bad or failure about yourself  0 0 0   Trouble concentrating 0 1 1   Moving slowly or fidgety/restless 0 0 0   Suicidal thoughts 0 0 0   PHQ-9 Score 0 4 3         07/23/2023   10:42 AM 08/17/2021    9:08 AM 05/10/2021    3:16 PM  GAD 7 : Generalized Anxiety Score  Nervous, Anxious, on Edge 0 0 1  Control/stop worrying 0 0 1  Worry too much - different things 0 0 1  Trouble relaxing 0 0 0  Restless 0 0 1  Easily annoyed or irritable 0 0 1  Afraid - awful might happen 0 0 0  Total GAD 7 Score 0 0 5      Review of Systems:   Pertinent items are noted in HPI Denies abnormal vaginal discharge w/ itching/odor/irritation, headaches, visual changes, shortness of breath, chest pain, abdominal pain, severe nausea/vomiting, or problems with urination or bowel movements unless otherwise stated above. Pertinent History Reviewed:  Reviewed past medical,surgical, social, obstetrical and family history.  Reviewed problem list, medications and allergies. Physical Assessment:   Vitals:    09/16/23 1525  BP: 117/74  Pulse: 87  Weight: 183 lb (83 kg)  Body mass index is 33.47 kg/m.        Physical Examination:   General appearance: Well appearing, and in no distress  Mental status: Alert, oriented to person, place, and time  Skin: Warm & dry  Cardiovascular: Normal heart rate noted  Respiratory: Normal respiratory effort, no distress  Abdomen: Soft, gravid, nontender  Pelvic: Cervical exam deferred         Extremities: Edema: None  Fetal Status:     Movement: Absent  [place] 20+1 wks,breech,anterior placenta gr 0,cx 4.6 cm,normal ovaries,FHR 154 bpm,svp of fluid 5.8 cm,EFW 335 g 45%,no obvious abnormalities,please have pt come back today for more pictures,limited view of face   Chaperone: N/A   No results found for this or any previous visit (from the past 24 hour(s)).  Assessment & Plan:  1) Low-risk pregnancy G2P1001 at [redacted]w[redacted]d with an Estimated Date of Delivery: 02/02/24   2) Interested in waterbirth> gave printed info again, schedule class for around 28ish wks  3) Postcoital spotting> on Sat, none since, discussed   Meds: No orders of the defined types were placed in this encounter.  Labs/procedures today: U/S  Plan:  Continue routine obstetrical care  Next visit: prefers in person    Reviewed: Preterm labor symptoms and general obstetric precautions including but not limited to vaginal bleeding, contractions, leaking of fluid and fetal movement were reviewed in detail with the patient.  All questions were answered. Does have home bp cuff. Office bp cuff given: not applicable. Check bp weekly, let us know if consistently >140 and/or >90.  Follow-up: Return in about 4 weeks (around 10/14/2023) for LROB, US:OB F/U face (if not completed today), CNM, in person.  No future appointments.  Orders Placed This Encounter  Procedures   US OB Follow Up   Cheral Marker CNM, [facility] 09/16/2023 4:43 PM

## 2023-09-17 ENCOUNTER — Other Ambulatory Visit: Payer: Medicaid Other

## 2023-09-17 ENCOUNTER — Encounter: Payer: Medicaid Other | Admitting: Women's Health

## 2023-09-24 DIAGNOSIS — Z419 Encounter for procedure for purposes other than remedying health state, unspecified: Secondary | ICD-10-CM | POA: Diagnosis not present

## 2023-10-14 ENCOUNTER — Ambulatory Visit (INDEPENDENT_AMBULATORY_CARE_PROVIDER_SITE_OTHER): Payer: Medicaid Other | Admitting: Women's Health

## 2023-10-14 ENCOUNTER — Encounter: Payer: Self-pay | Admitting: Women's Health

## 2023-10-14 VITALS — BP 119/75 | HR 92 | Wt 186.8 lb

## 2023-10-14 DIAGNOSIS — M9903 Segmental and somatic dysfunction of lumbar region: Secondary | ICD-10-CM | POA: Diagnosis not present

## 2023-10-14 DIAGNOSIS — Z3A24 24 weeks gestation of pregnancy: Secondary | ICD-10-CM

## 2023-10-14 DIAGNOSIS — M9902 Segmental and somatic dysfunction of thoracic region: Secondary | ICD-10-CM | POA: Diagnosis not present

## 2023-10-14 DIAGNOSIS — M5442 Lumbago with sciatica, left side: Secondary | ICD-10-CM | POA: Diagnosis not present

## 2023-10-14 DIAGNOSIS — M9905 Segmental and somatic dysfunction of pelvic region: Secondary | ICD-10-CM | POA: Diagnosis not present

## 2023-10-14 DIAGNOSIS — Z3482 Encounter for supervision of other normal pregnancy, second trimester: Secondary | ICD-10-CM

## 2023-10-14 NOTE — Patient Instructions (Signed)
[name], thank you for choosing our office today! We appreciate the opportunity to meet your healthcare needs. You may receive a short survey by mail, e-mail, or through [employer]. If you are happy with your care we would appreciate if you could take just a few minutes to complete the survey questions. We read all of your comments and take your feedback very seriously. Thank you again for choosing our office.  [facility] [employer] Team at [facility]  [facility] ([address] [place], [place] [address]) Entrance C, located off of [address] Free 24/7 valet parking   You will have your sugar test next visit.  Please do not eat or drink anything after midnight the night before you come, not even water.  You will be here for at least two hours.  Please make an appointment online for the bloodwork at [email or web address] for 8:00am (or as close to this as possible). Make sure you select the [facility] service center.   CLASSES: Go to [email or web address] to register for classes (childbirth, breastfeeding, waterbirth, infant CPR, daddy bootcamp, etc.)  Call the office [phone]) or go to [facility] if: You begin to have strong, frequent contractions Your water breaks.  Sometimes it is a big gush of fluid, sometimes it is just a trickle that keeps getting your panties wet or running down your legs You have vaginal bleeding.  It is normal to have a small amount of spotting if your cervix was checked.  You don't feel your baby moving like normal.  If you don't, get you something to eat and drink and lay down and focus on feeling your baby move.   If your baby is still not moving like normal, you should call the office or go to [facility].  Call the office [phone]) or go to [facility] hospital for these signs of pre-eclampsia: Severe headache that does not go away with Tylenol Visual changes- seeing spots, double, blurred vision Pain under your right breast or upper  abdomen that does not go away with Tums or heartburn medicine Nausea and/or vomiting Severe swelling in your hands, feet, and face    [place] [facility]/Family Doctors [facility]): [address] Colette Ribas, [phone]           [facility]: [address], [phone]                [facility]): [address], [phone]  [facility] Department: [address], [place], [phone]    [facility] Pediatricians/Family Doctors [facility]): [address], [phone] [facility]: [address] [place], [phone] [facility]: [address], [phone]  [facility] Doctors  [facility]): [phone] [facility]: [address], [phone]   [facility] Doctors [facility]: [address] [phone]  [facility] Doctors  [facility]: [phone], [phone]  Home Blood Pressure Monitoring for Patients   Your provider has recommended that you check your blood pressure (BP) at least once a week at home. If you do not have a blood pressure cuff at home, one will be provided for you. Contact your provider if you have not received your monitor within 1 week.   Helpful Tips for Accurate Home Blood Pressure Checks  Don't smoke, exercise, or drink caffeine 30 minutes before checking  your BP Use the restroom before checking your BP (a full bladder can raise your pressure) Relax in a comfortable upright chair Feet on the ground Left arm resting comfortably on a flat surface at the level of your heart Legs uncrossed Back supported Sit quietly and don't talk Place the cuff on your bare arm Adjust snuggly, so that only two fingertips can fit between your skin and the top of the cuff Check 2 readings separated by at least one  minute Keep a log of your BP readings For a visual, please reference this diagram: http://ccnc.care/bpdiagram  Provider Name: [facility]     Phone: [phone]  Zone 1: ALL CLEAR  Continue to monitor your symptoms:  BP reading is less than 140 (top number) or less than 90 (bottom number)  No right upper stomach pain No headaches or seeing spots No feeling nauseated or throwing up No swelling in face and hands  Zone 2: CAUTION Call your doctor's office for any of the following:  BP reading is greater than 140 (top number) or greater than 90 (bottom number)  Stomach pain under your ribs in the middle or right side Headaches or seeing spots Feeling nauseated or throwing up Swelling in face and hands  Zone 3: EMERGENCY  Seek immediate medical care if you have any of the following:  BP reading is greater than160 (top number) or greater than 110 (bottom number) Severe headaches not improving with Tylenol Serious difficulty catching your breath Any worsening symptoms from Zone 2   Second Trimester of Pregnancy The second trimester is from week 13 through week 28, months 4 through 6. The second trimester is often a time when you feel your best. Your body has also adjusted to being pregnant, and you begin to feel better physically. Usually, morning sickness has lessened or quit completely, you may have more energy, and you may have an increase in appetite. The second trimester is also a time when the fetus is growing rapidly. At the end of the sixth month, the fetus is about 9 inches long and weighs about 1 pounds. You will likely begin to feel the baby move (quickening) between 18 and 20 weeks of the pregnancy. BODY CHANGES Your body goes through many changes during pregnancy. The changes vary from woman to woman.  Your weight will continue to increase. You will notice your lower abdomen bulging out. You may begin to get stretch marks on your hips, abdomen, and breasts. You may  develop headaches that can be relieved by medicines approved by your health care provider. You may urinate more often because the fetus is pressing on your bladder. You may develop or continue to have heartburn as a result of your pregnancy. You may develop constipation because certain hormones are causing the muscles that push waste through your intestines to slow down. You may develop hemorrhoids or swollen, bulging veins (varicose veins). You may have back pain because of the weight gain and pregnancy hormones relaxing your joints between the bones in your pelvis and as a result of a shift in weight and the muscles that support your balance. Your breasts will continue to grow and be tender. Your gums may bleed and may be sensitive to brushing and flossing. Dark spots or blotches (chloasma, mask of pregnancy) may develop on your face. This will likely fade after the baby is born. A dark line from your belly button to the pubic area (linea nigra) may appear. This will likely fade after the  baby is born. You may have changes in your hair. These can include thickening of your hair, rapid growth, and changes in texture. Some women also have hair loss during or after pregnancy, or hair that feels dry or thin. Your hair will most likely return to normal after your baby is born. WHAT TO EXPECT AT YOUR PRENATAL VISITS During a routine prenatal visit: You will be weighed to make sure you and the fetus are growing normally. Your blood pressure will be taken. Your abdomen will be measured to track your baby's growth. The fetal heartbeat will be listened to. Any test results from the previous visit will be discussed. Your health care provider may ask you: How you are feeling. If you are feeling the baby move. If you have had any abnormal symptoms, such as leaking fluid, bleeding, severe headaches, or abdominal cramping. If you have any questions. Other tests that may be performed during your second  trimester include: Blood tests that check for: Low iron levels (anemia). Gestational diabetes (between 24 and 28 weeks). Rh antibodies. Urine tests to check for infections, diabetes, or protein in the urine. An ultrasound to confirm the proper growth and development of the baby. An amniocentesis to check for possible genetic problems. Fetal screens for spina bifida and Down syndrome. HOME CARE INSTRUCTIONS  Avoid all smoking, herbs, alcohol, and unprescribed drugs. These chemicals affect the formation and growth of the baby. Follow your health care provider's instructions regarding medicine use. There are medicines that are either safe or unsafe to take during pregnancy. Exercise only as directed by your health care provider. Experiencing uterine cramps is a good sign to stop exercising. Continue to eat regular, healthy meals. Wear a good support bra for breast tenderness. Do not use hot tubs, steam rooms, or saunas. Wear your seat belt at all times when driving. Avoid raw meat, uncooked cheese, cat litter boxes, and soil used by cats. These carry germs that can cause birth defects in the baby. Take your prenatal vitamins. Try taking a stool softener (if your health care provider approves) if you develop constipation. Eat more high-fiber foods, such as fresh vegetables or fruit and whole grains. Drink plenty of fluids to keep your urine clear or pale yellow. Take warm sitz baths to soothe any pain or discomfort caused by hemorrhoids. Use hemorrhoid cream if your health care provider approves. If you develop varicose veins, wear support hose. Elevate your feet for 15 minutes, 3-4 times a day. Limit salt in your diet. Avoid heavy lifting, wear low heel shoes, and practice good posture. Rest with your legs elevated if you have leg cramps or low back pain. Visit your dentist if you have not gone yet during your pregnancy. Use a soft toothbrush to brush your teeth and be gentle when you floss. A  sexual relationship may be continued unless your health care provider directs you otherwise. Continue to go to all your prenatal visits as directed by your health care provider. SEEK MEDICAL CARE IF:  You have dizziness. You have mild pelvic cramps, pelvic pressure, or nagging pain in the abdominal area. You have persistent nausea, vomiting, or diarrhea. You have a bad smelling vaginal discharge. You have pain with urination. SEEK IMMEDIATE MEDICAL CARE IF:  You have a fever. You are leaking fluid from your vagina. You have spotting or bleeding from your vagina. You have severe abdominal cramping or pain. You have rapid weight gain or loss. You have shortness of breath with chest pain. You  notice sudden or extreme swelling of your face, hands, ankles, feet, or legs. You have not felt your baby move in over an hour. You have severe headaches that do not go away with medicine. You have vision changes. Document Released: 12/04/2001 Document Revised: 12/15/2013 Document Reviewed: 02/10/2013 [facility] Patient Information 2015 [place], [place]. This information is not intended to replace advice given to you by your health care provider. Make sure you discuss any questions you have with your health care provider.   Considering Waterbirth? Guide for patients at [facility] [employer] [facility]) Why consider waterbirth? Gentle birth for babies  Less pain medicine used in labor  May allow for passive descent/less pushing  May reduce perineal tears  More mobility and instinctive maternal position changes  Increased maternal relaxation   Is waterbirth safe? What are the risks of infection, drowning or other complications? Infection:  Very low risk (3.7 % for tub vs 4.8% for bed)  7 in 8000 waterbirths with documented infection  Poorly cleaned equipment most common cause  Slightly lower group B strep transmission rate  Drowning  Maternal:  Very low risk  Related to seizures or fainting   Newborn:  Very low risk. No evidence of increased risk of respiratory problems in multiple large studies  Physiological protection from breathing under water  Avoid underwater birth if there are any fetal complications  Once baby's head is out of the water, keep it out.  Birth complication  Some reports of cord trauma, but risk decreased by bringing baby to surface gradually  No evidence of increased risk of shoulder dystocia. Mothers can usually change positions faster in water than in a bed, possibly aiding the maneuvers to free the shoulder.   There are 2 things you MUST do to have a waterbirth with [facility]: Attend a waterbirth class at [employer] [facility]   3rd Wednesday of every month from 7-9 pm (virtual during COVID) [employer] at [email or web address] or [email or web address] or by calling [phone] Bring [place] the certificate from the class to your prenatal appointment or send via MyChart Meet with a midwife at 36 weeks* to see if you can still plan a waterbirth and to sign the consent.   *We also recommend that you schedule as many of your prenatal visits with a midwife as possible.    Helpful information: You may want to bring a bathing suit top to the hospital to wear during labor but this is optional.  All other supplies are provided by the hospital. Please arrive at the hospital with signs of active labor, and do not wait at home until late in labor. It takes 45 min- 1 hour for fetal monitoring, and check in to your room to take place, plus transport and filling of the waterbirth tub.    Things that would prevent you from having a waterbirth: Premature, <37wks  Previous cesarean birth  Presence of thick meconium-stained fluid  Multiple gestation (Twins, triplets, etc.)  Uncontrolled diabetes or gestational diabetes requiring medication  Hypertension diagnosed in pregnancy or preexisting hypertension (gestational hypertension,  preeclampsia, or chronic hypertension) Fetal growth restriction (your baby measures less than 10th percentile on ultrasound) Heavy vaginal bleeding  Non-reassuring fetal heart rate  Active infection (MRSA, etc.). Group B Strep is NOT a contraindication for waterbirth.  If your labor has to be induced and induction method requires continuous monitoring of the baby's heart rate  Other risks/issues identified by your obstetrical provider   Please remember that birth  is unpredictable. Under certain unforeseeable circumstances your provider may advise against giving birth in the tub. These decisions will be made on a case-by-case basis and with the safety of you and your baby as our highest priority.    Updated 03/28/22

## 2023-10-14 NOTE — Progress Notes (Signed)
LOW-RISK PREGNANCY VISIT Patient name: Catherine Park MRN [id number]  Date of birth: 1996/01/03 Chief Complaint:   Routine Prenatal Visit  History of Present Illness:   Catherine Park is a 27 y.o. G71P1001 female at [redacted]w[redacted]d with an Estimated Date of Delivery: 02/02/24 being seen today for ongoing management of a low-risk pregnancy.   Today she reports not sleeping well, concerned about turning on back while sleeping- discussed can put pillow under hip or behind back. Contractions: Not present. Vag. Bleeding: None.  Movement: Present. denies leaking of fluid.     07/23/2023   10:42 AM 08/17/2021    9:08 AM 05/10/2021    3:15 PM 10/07/2019   11:27 AM  Depression screen PHQ 2/9  Decreased Interest 0 0 0 0  Down, Depressed, Hopeless 0 0 0 0  PHQ - 2 Score 0 0 0 0  Altered sleeping 0 1 1   Tired, decreased energy 0 1 0   Change in appetite 0 1 1   Feeling bad or failure about yourself  0 0 0   Trouble concentrating 0 1 1   Moving slowly or fidgety/restless 0 0 0   Suicidal thoughts 0 0 0   PHQ-9 Score 0 4 3         07/23/2023   10:42 AM 08/17/2021    9:08 AM 05/10/2021    3:16 PM  GAD 7 : Generalized Anxiety Score  Nervous, Anxious, on Edge 0 0 1  Control/stop worrying 0 0 1  Worry too much - different things 0 0 1  Trouble relaxing 0 0 0  Restless 0 0 1  Easily annoyed or irritable 0 0 1  Afraid - awful might happen 0 0 0  Total GAD 7 Score 0 0 5      Review of Systems:   Pertinent items are noted in HPI Denies abnormal vaginal discharge w/ itching/odor/irritation, headaches, visual changes, shortness of breath, chest pain, abdominal pain, severe nausea/vomiting, or problems with urination or bowel movements unless otherwise stated above. Pertinent History Reviewed:  Reviewed past medical,surgical, social, obstetrical and family history.  Reviewed problem list, medications and allergies. Physical Assessment:   Vitals:   10/14/23 1609  BP: 119/75  Pulse:  92  Weight: 186 lb 12.8 oz (84.7 kg)  Body mass index is 34.17 kg/m.        Physical Examination:   General appearance: Well appearing, and in no distress  Mental status: Alert, oriented to person, place, and time  Skin: Warm & dry  Cardiovascular: Normal heart rate noted  Respiratory: Normal respiratory effort, no distress  Abdomen: Soft, gravid, nontender  Pelvic: Cervical exam deferred         Extremities:    Fetal Status: Fetal Heart Rate (bpm): 150 Fundal Height: 24 cm Movement: Present    Chaperone: N/A   No results found for this or any previous visit (from the past 24 hour(s)).  Assessment & Plan:  1) Low-risk pregnancy G2P1001 at [redacted]w[redacted]d with an Estimated Date of Delivery: 02/02/24   2) Interested in Systems developer, gave printed info, sign up for class, send [place] certificate via MyChart after   Meds: No orders of the defined types were placed in this encounter.  Labs/procedures today: none  Plan:  Continue routine obstetrical care  Next visit: prefers will be in person for pn2     Reviewed: Preterm labor symptoms and general obstetric precautions including but not limited to vaginal bleeding, contractions, leaking of fluid and fetal  movement were reviewed in detail with the patient.  All questions were answered. Does have home bp cuff. Office bp cuff given: not applicable. Check bp weekly, let us know if consistently >140 and/or >90.  Follow-up: Return in about 4 weeks (around 11/11/2023) for LROB, PN2, CNM, in person.  No future appointments.  No orders of the defined types were placed in this encounter.  Cheral Marker CNM, [facility] 10/14/2023 4:22 PM

## 2023-10-25 DIAGNOSIS — Z419 Encounter for procedure for purposes other than remedying health state, unspecified: Secondary | ICD-10-CM | POA: Diagnosis not present

## 2023-11-11 DIAGNOSIS — M5442 Lumbago with sciatica, left side: Secondary | ICD-10-CM | POA: Diagnosis not present

## 2023-11-11 DIAGNOSIS — M9905 Segmental and somatic dysfunction of pelvic region: Secondary | ICD-10-CM | POA: Diagnosis not present

## 2023-11-11 DIAGNOSIS — M9902 Segmental and somatic dysfunction of thoracic region: Secondary | ICD-10-CM | POA: Diagnosis not present

## 2023-11-11 DIAGNOSIS — M9903 Segmental and somatic dysfunction of lumbar region: Secondary | ICD-10-CM | POA: Diagnosis not present

## 2023-11-12 ENCOUNTER — Encounter: Payer: Self-pay | Admitting: Women's Health

## 2023-11-12 ENCOUNTER — Other Ambulatory Visit: Payer: Medicaid Other

## 2023-11-12 ENCOUNTER — Ambulatory Visit (INDEPENDENT_AMBULATORY_CARE_PROVIDER_SITE_OTHER): Payer: Medicaid Other | Admitting: Women's Health

## 2023-11-12 VITALS — BP 120/74 | HR 89 | Wt 191.0 lb

## 2023-11-12 DIAGNOSIS — Z131 Encounter for screening for diabetes mellitus: Secondary | ICD-10-CM

## 2023-11-12 DIAGNOSIS — Z23 Encounter for immunization: Secondary | ICD-10-CM | POA: Diagnosis not present

## 2023-11-12 DIAGNOSIS — Z3480 Encounter for supervision of other normal pregnancy, unspecified trimester: Secondary | ICD-10-CM

## 2023-11-12 DIAGNOSIS — Z1332 Encounter for screening for maternal depression: Secondary | ICD-10-CM

## 2023-11-12 DIAGNOSIS — Z3483 Encounter for supervision of other normal pregnancy, third trimester: Secondary | ICD-10-CM

## 2023-11-12 DIAGNOSIS — Z3A28 28 weeks gestation of pregnancy: Secondary | ICD-10-CM

## 2023-11-12 NOTE — Patient Instructions (Signed)
Catherine Park, thank you for choosing our office today! We appreciate the opportunity to meet your healthcare needs. You may receive a short survey by mail, e-mail, or through [employer]. If you are happy with your care we would appreciate if you could take just a few minutes to complete the survey questions. We read all of your comments and take your feedback very seriously. Thank you again for choosing our office.  Center for [employer] Team at [facility]  [facility] at [facility] ([address] [place], [place] [address]) Entrance C, located off of [address] Free 24/7 valet parking   CLASSES: Go to [email or web address] to register for classes (childbirth, breastfeeding, waterbirth, infant CPR, daddy bootcamp, etc.)  Call the office [phone]) or go to [facility] if: You begin to have strong, frequent contractions Your water breaks.  Sometimes it is a big gush of fluid, sometimes it is just a trickle that keeps getting your panties wet or running down your legs You have vaginal bleeding.  It is normal to have a small amount of spotting if your cervix was checked.  You don't feel your baby moving like normal.  If you don't, get you something to eat and drink and lay down and focus on feeling your baby move.   If your baby is still not moving like normal, you should call the office or go to [employer]  Call the office [phone]) or go to [facility] hospital for these signs of pre-eclampsia: Severe headache that does not go away with Tylenol Visual changes- seeing spots, double, blurred vision Pain under your right breast or upper abdomen that does not go away with Tums or heartburn medicine Nausea and/or vomiting Severe swelling in your hands, feet, and face   Tdap Vaccine It is recommended that you get the Tdap vaccine during the third trimester of EACH pregnancy to help protect your baby from getting pertussis (whooping cough) 27-36 weeks is the BEST time to do  this so that you can pass the protection on to your baby. During pregnancy is better than after pregnancy, but if you are unable to get it during pregnancy it will be offered at the hospital.  You can get this vaccine with [place], at the health department, your family doctor, or some local pharmacies Everyone who will be around your baby should also be up-to-date on their vaccines before the baby comes. Adults (who are not pregnant) only need 1 dose of Tdap during adulthood.   [facility] Pediatricians/Family Doctors [facility]): [address] Colette Ribas, [phone]           [facility]: [address], [phone]                [facility]): [address], [phone] (call to ask if accepting patients) [facility] Department: [address], [place], [phone]    [facility] Pediatricians/Family Doctors [facility]): [phone], [phone] [facility]: [address] [place], [phone] [facility]: [address], [phone]  [facility] Doctors  [facility]): [phone] [facility]: [address], [phone]   [facility] Doctors [facility]: [address], [phone]  [facility] Family Doctors  [facility]: [phone], [phone]  Home Blood Pressure Monitoring for Patients   Your provider has recommended that you check your  blood pressure (BP) at least once a week at home. If you do not have a blood pressure cuff at home, one will be provided for you. Contact your provider if you have not received your monitor within 1 week.   Helpful Tips for Accurate Home Blood Pressure Checks  Don't smoke, exercise, or drink caffeine 30 minutes before checking your BP Use the restroom before checking your BP (a full bladder can raise your  pressure) Relax in a comfortable upright chair Feet on the ground Left arm resting comfortably on a flat surface at the level of your heart Legs uncrossed Back supported Sit quietly and don't talk Place the cuff on your bare arm Adjust snuggly, so that only two fingertips can fit between your skin and the top of the cuff Check 2 readings separated by at least one minute Keep a log of your BP readings For a visual, please reference this diagram: http://ccnc.care/bpdiagram  Provider Name: [facility]     Phone: [phone]  Zone 1: ALL CLEAR  Continue to monitor your symptoms:  BP reading is less than 140 (top number) or less than 90 (bottom number)  No right upper stomach pain No headaches or seeing spots No feeling nauseated or throwing up No swelling in face and hands  Zone 2: CAUTION Call your doctor's office for any of the following:  BP reading is greater than 140 (top number) or greater than 90 (bottom number)  Stomach pain under your ribs in the middle or right side Headaches or seeing spots Feeling nauseated or throwing up Swelling in face and hands  Zone 3: EMERGENCY  Seek immediate medical care if you have any of the following:  BP reading is greater than160 (top number) or greater than 110 (bottom number) Severe headaches not improving with Tylenol Serious difficulty catching your breath Any worsening symptoms from Zone 2   Third Trimester of Pregnancy The third trimester is from week 29 through week 42, months 7 through 9. The third trimester is a time when the fetus is growing rapidly. At the end of the ninth month, the fetus is about 20 inches in length and weighs 6-10 pounds.  BODY CHANGES Your body goes through many changes during pregnancy. The changes vary from woman to woman.  Your weight will continue to increase. You can expect to gain 25-35 pounds (11-16 kg) by the end of the pregnancy. You may begin to get stretch marks on your hips, abdomen,  and breasts. You may urinate more often because the fetus is moving lower into your pelvis and pressing on your bladder. You may develop or continue to have heartburn as a result of your pregnancy. You may develop constipation because certain hormones are causing the muscles that push waste through your intestines to slow down. You may develop hemorrhoids or swollen, bulging veins (varicose veins). You may have pelvic pain because of the weight gain and pregnancy hormones relaxing your joints between the bones in your pelvis. Backaches may result from overexertion of the muscles supporting your posture. You may have changes in your hair. These can include thickening of your hair, rapid growth, and changes in texture. Some women also have hair loss during or after pregnancy, or hair that feels dry or thin. Your hair will most likely return to normal after your baby is born. Your breasts will continue to grow and be tender. A yellow discharge may leak from your breasts called colostrum. Your belly button may stick out. You may  feel short of breath because of your expanding uterus. You may notice the fetus "dropping," or moving lower in your abdomen. You may have a bloody mucus discharge. This usually occurs a few days to a week before labor begins. Your cervix becomes thin and soft (effaced) near your due date. WHAT TO EXPECT AT YOUR PRENATAL EXAMS  You will have prenatal exams every 2 weeks until week 36. Then, you will have weekly prenatal exams. During a routine prenatal visit: You will be weighed to make sure you and the fetus are growing normally. Your blood pressure is taken. Your abdomen will be measured to track your baby's growth. The fetal heartbeat will be listened to. Any test results from the previous visit will be discussed. You may have a cervical check near your due date to see if you have effaced. At around 36 weeks, your caregiver will check your cervix. At the same time, your  caregiver will also perform a test on the secretions of the vaginal tissue. This test is to determine if a type of bacteria, Group B streptococcus, is present. Your caregiver will explain this further. Your caregiver may ask you: What your birth plan is. How you are feeling. If you are feeling the baby move. If you have had any abnormal symptoms, such as leaking fluid, bleeding, severe headaches, or abdominal cramping. If you have any questions. Other tests or screenings that may be performed during your third trimester include: Blood tests that check for low iron levels (anemia). Fetal testing to check the health, activity level, and growth of the fetus. Testing is done if you have certain medical conditions or if there are problems during the pregnancy. FALSE LABOR You may feel small, irregular contractions that eventually go away. These are called Braxton Hicks contractions, or false labor. Contractions may last for hours, days, or even weeks before true labor sets in. If contractions come at regular intervals, intensify, or become painful, it is best to be seen by your caregiver.  SIGNS OF LABOR  Menstrual-like cramps. Contractions that are 5 minutes apart or less. Contractions that start on the top of the uterus and spread down to the lower abdomen and back. A sense of increased pelvic pressure or back pain. A watery or bloody mucus discharge that comes from the vagina. If you have any of these signs before the 37th week of pregnancy, call your caregiver right away. You need to go to the hospital to get checked immediately. HOME CARE INSTRUCTIONS  Avoid all smoking, herbs, alcohol, and unprescribed drugs. These chemicals affect the formation and growth of the baby. Follow your caregiver's instructions regarding medicine use. There are medicines that are either safe or unsafe to take during pregnancy. Exercise only as directed by your caregiver. Experiencing uterine cramps is a good sign to  stop exercising. Continue to eat regular, healthy meals. Wear a good support bra for breast tenderness. Do not use hot tubs, steam rooms, or saunas. Wear your seat belt at all times when driving. Avoid raw meat, uncooked cheese, cat litter boxes, and soil used by cats. These carry germs that can cause birth defects in the baby. Take your prenatal vitamins. Try taking a stool softener (if your caregiver approves) if you develop constipation. Eat more high-fiber foods, such as fresh vegetables or fruit and whole grains. Drink plenty of fluids to keep your urine clear or pale yellow. Take warm sitz baths to soothe any pain or discomfort caused by hemorrhoids. Use hemorrhoid cream if  your caregiver approves. If you develop varicose veins, wear support hose. Elevate your feet for 15 minutes, 3-4 times a day. Limit salt in your diet. Avoid heavy lifting, wear low heal shoes, and practice good posture. Rest a lot with your legs elevated if you have leg cramps or low back pain. Visit your dentist if you have not gone during your pregnancy. Use a soft toothbrush to brush your teeth and be gentle when you floss. A sexual relationship may be continued unless your caregiver directs you otherwise. Do not travel far distances unless it is absolutely necessary and only with the approval of your caregiver. Take prenatal classes to understand, practice, and ask questions about the labor and delivery. Make a trial run to the hospital. Pack your hospital bag. Prepare the baby's nursery. Continue to go to all your prenatal visits as directed by your caregiver. SEEK MEDICAL CARE IF: You are unsure if you are in labor or if your water has broken. You have dizziness. You have mild pelvic cramps, pelvic pressure, or nagging pain in your abdominal area. You have persistent nausea, vomiting, or diarrhea. You have a bad smelling vaginal discharge. You have pain with urination. SEEK IMMEDIATE MEDICAL CARE IF:  You  have a fever. You are leaking fluid from your vagina. You have spotting or bleeding from your vagina. You have severe abdominal cramping or pain. You have rapid weight loss or gain. You have shortness of breath with chest pain. You notice sudden or extreme swelling of your face, hands, ankles, feet, or legs. You have not felt your baby move in over an hour. You have severe headaches that do not go away with medicine. You have vision changes. Document Released: 12/04/2001 Document Revised: 12/15/2013 Document Reviewed: 02/10/2013 [facility] Patient Information 2015 [place], [place]. This information is not intended to replace advice given to you by your health care provider. Make sure you discuss any questions you have with your health care provider.

## 2023-11-12 NOTE — Progress Notes (Signed)
LOW-RISK PREGNANCY VISIT Patient name: Catherine Park MRN [id number]  Date of birth: May 24, 1996 Chief Complaint:   Routine Prenatal Visit (PN2 today!!!)  History of Present Illness:   Catherine Park is a 27 y.o. G86P1001 female at [redacted]w[redacted]d with an Estimated Date of Delivery: 02/02/24 being seen today for ongoing management of a low-risk pregnancy.   Today she reports no complaints. Had waterbirth class last week.  Contractions: Not present. Vag. Bleeding: None.  Movement: Present. denies leaking of fluid.     11/12/2023    8:47 AM 07/23/2023   10:42 AM 08/17/2021    9:08 AM 05/10/2021    3:15 PM 10/07/2019   11:27 AM  Depression screen PHQ 2/9  Decreased Interest 0 0 0 0 0  Down, Depressed, Hopeless 0 0 0 0 0  PHQ - 2 Score 0 0 0 0 0  Altered sleeping 1 0 1 1   Tired, decreased energy 1 0 1 0   Change in appetite 0 0 1 1   Feeling bad or failure about yourself  0 0 0 0   Trouble concentrating 1 0 1 1   Moving slowly or fidgety/restless 0 0 0 0   Suicidal thoughts 0 0 0 0   PHQ-9 Score 3 0 4 3         11/12/2023    8:49 AM 07/23/2023   10:42 AM 08/17/2021    9:08 AM 05/10/2021    3:16 PM  GAD 7 : Generalized Anxiety Score  Nervous, Anxious, on Edge 0 0 0 1  Control/stop worrying 0 0 0 1  Worry too much - different things 0 0 0 1  Trouble relaxing 1 0 0 0  Restless 1 0 0 1  Easily annoyed or irritable 1 0 0 1  Afraid - awful might happen 0 0 0 0  Total GAD 7 Score 3 0 0 5      Review of Systems:   Pertinent items are noted in HPI Denies abnormal vaginal discharge w/ itching/odor/irritation, headaches, visual changes, shortness of breath, chest pain, abdominal pain, severe nausea/vomiting, or problems with urination or bowel movements unless otherwise stated above. Pertinent History Reviewed:  Reviewed past medical,surgical, social, obstetrical and family history.  Reviewed problem list, medications and allergies. Physical Assessment:   Vitals:    11/12/23 0844  BP: 120/74  Pulse: 89  Weight: 191 lb (86.6 kg)  Body mass index is 34.93 kg/m.        Physical Examination:   General appearance: Well appearing, and in no distress  Mental status: Alert, oriented to person, place, and time  Skin: Warm & dry  Cardiovascular: Normal heart rate noted  Respiratory: Normal respiratory effort, no distress  Abdomen: Soft, gravid, nontender  Pelvic: Cervical exam deferred         Extremities: Edema: Trace  Fetal Status: Fetal Heart Rate (bpm): 160 Fundal Height: 28 cm Movement: Present    Chaperone: N/A   No results found for this or any previous visit (from the past 24 hour(s)).  Assessment & Plan:  1) Low-risk pregnancy G2P1001 at [redacted]w[redacted]d with an Estimated Date of Delivery: 02/02/24   2) Interested in Systems developer, send certificate via MyChart, discussed providers at hospital will be ones to determine eligibility   Meds: No orders of the defined types were placed in this encounter.  Labs/procedures today: tdap and PN2  Plan:  Continue routine obstetrical care  Next visit: prefers in person    Reviewed: Preterm labor  symptoms and general obstetric precautions including but not limited to vaginal bleeding, contractions, leaking of fluid and fetal movement were reviewed in detail with the patient.  All questions were answered. Does have home bp cuff. Office bp cuff given: not applicable. Check bp weekly, let us know if consistently >140 and/or >90.  Follow-up: Return in about 2 weeks (around 11/26/2023) for LROB, CNM.  Future Appointments  Date Time Provider Department Center  11/25/2023  2:50 PM Cheral Marker, CNM [facility]    Orders Placed This Encounter  Procedures   Tdap vaccine greater than or equal to 7yo IM   Cheral Marker CNM, [facility] 11/12/2023 9:10 AM

## 2023-11-13 LAB — CBC
Hematocrit: 38.1 % (ref 34.0–46.6)
Hemoglobin: 12.4 g/dL (ref 11.1–15.9)
MCH: 28.9 pg (ref 26.6–33.0)
MCHC: 32.5 g/dL (ref 31.5–35.7)
MCV: 89 fL (ref 79–97)
Platelets: 272 10*3/uL (ref 150–450)
RBC: 4.29 x10E6/uL (ref 3.77–5.28)
RDW: 12.6 % (ref 11.7–15.4)
WBC: 11.9 10*3/uL — ABNORMAL HIGH (ref 3.4–10.8)

## 2023-11-13 LAB — RPR: RPR Ser Ql: NONREACTIVE

## 2023-11-13 LAB — GLUCOSE TOLERANCE, 2 HOURS W/ 1HR
Glucose, 1 hour: 115 mg/dL (ref 70–179)
Glucose, 2 hour: 103 mg/dL (ref 70–152)
Glucose, Fasting: 85 mg/dL (ref 70–91)

## 2023-11-13 LAB — ANTIBODY SCREEN: Antibody Screen: NEGATIVE

## 2023-11-13 LAB — HIV ANTIBODY (ROUTINE TESTING W REFLEX): HIV Screen 4th Generation wRfx: NONREACTIVE

## 2023-11-24 DIAGNOSIS — Z419 Encounter for procedure for purposes other than remedying health state, unspecified: Secondary | ICD-10-CM | POA: Diagnosis not present

## 2023-11-25 ENCOUNTER — Ambulatory Visit (INDEPENDENT_AMBULATORY_CARE_PROVIDER_SITE_OTHER): Payer: Medicaid Other | Admitting: Women's Health

## 2023-11-25 ENCOUNTER — Encounter: Payer: Self-pay | Admitting: Women's Health

## 2023-11-25 VITALS — BP 124/70 | Wt 195.0 lb

## 2023-11-25 DIAGNOSIS — Z3480 Encounter for supervision of other normal pregnancy, unspecified trimester: Secondary | ICD-10-CM

## 2023-11-25 DIAGNOSIS — Z3A30 30 weeks gestation of pregnancy: Secondary | ICD-10-CM

## 2023-11-25 DIAGNOSIS — Z3483 Encounter for supervision of other normal pregnancy, third trimester: Secondary | ICD-10-CM

## 2023-11-25 NOTE — Patient Instructions (Signed)
Catherine Park, thank you for choosing our office today! We appreciate the opportunity to meet your healthcare needs. You may receive a short survey by mail, e-mail, or through [employer]. If you are happy with your care we would appreciate if you could take just a few minutes to complete the survey questions. We read all of your comments and take your feedback very seriously. Thank you again for choosing our office.  Center for [employer] Team at [facility]  [facility] at [facility] ([address] [place], [place] [address]) Entrance C, located off of [address] Free 24/7 valet parking   CLASSES: Go to [email or web address] to register for classes (childbirth, breastfeeding, waterbirth, infant CPR, daddy bootcamp, etc.)  Call the office [phone]) or go to [facility] if: You begin to have strong, frequent contractions Your water breaks.  Sometimes it is a big gush of fluid, sometimes it is just a trickle that keeps getting your panties wet or running down your legs You have vaginal bleeding.  It is normal to have a small amount of spotting if your cervix was checked.  You don't feel your baby moving like normal.  If you don't, get you something to eat and drink and lay down and focus on feeling your baby move.   If your baby is still not moving like normal, you should call the office or go to [facility].  Call the office [phone]) or go to [facility] hospital for these signs of pre-eclampsia: Severe headache that does not go away with Tylenol Visual changes- seeing spots, double, blurred vision Pain under your right breast or upper abdomen that does not go away with Tums or heartburn medicine Nausea and/or vomiting Severe swelling in your hands, feet, and face   Tdap Vaccine It is recommended that you get the Tdap vaccine during the third trimester of EACH pregnancy to help protect your baby from getting pertussis (whooping cough) 27-36 weeks is the BEST time to do  this so that you can pass the protection on to your baby. During pregnancy is better than after pregnancy, but if you are unable to get it during pregnancy it will be offered at the hospital.  You can get this vaccine with [place], at the health department, your family doctor, or some local pharmacies Everyone who will be around your baby should also be up-to-date on their vaccines before the baby comes. Adults (who are not pregnant) only need 1 dose of Tdap during adulthood.   [facility] Pediatricians/Family Doctors [facility]): [address] Colette Ribas, [phone]           [facility]: [address], [phone]                [facility]): [address], [phone] (call to ask if accepting patients) [facility] Department: [address], [place], [phone]    [facility] Pediatricians/Family Doctors [facility]): [phone] [address], [phone] [facility]: [address] [place], [phone] [facility]: [address], [phone]  [facility] Doctors  [facility]): [phone] [facility]: [address] [phone]   [facility] Doctors [facility]: [address], [phone]  [facility] Family Doctors  [facility]: [phone], [phone]  Home Blood Pressure Monitoring for Patients   Your provider has recommended that you check your  blood pressure (BP) at least once a week at home. If you do not have a blood pressure cuff at home, one will be provided for you. Contact your provider if you have not received your monitor within 1 week.   Helpful Tips for Accurate Home Blood Pressure Checks  Don't smoke, exercise, or drink caffeine 30 minutes before checking your BP Use the restroom before checking your BP (a full bladder can raise your  pressure) Relax in a comfortable upright chair Feet on the ground Left arm resting comfortably on a flat surface at the level of your heart Legs uncrossed Back supported Sit quietly and don't talk Place the cuff on your bare arm Adjust snuggly, so that only two fingertips can fit between your skin and the top of the cuff Check 2 readings separated by at least one minute Keep a log of your BP readings For a visual, please reference this diagram: http://ccnc.care/bpdiagram  Provider Name: [facility]     Phone: [phone]  Zone 1: ALL CLEAR  Continue to monitor your symptoms:  BP reading is less than 140 (top number) or less than 90 (bottom number)  No right upper stomach pain No headaches or seeing spots No feeling nauseated or throwing up No swelling in face and hands  Zone 2: CAUTION Call your doctor's office for any of the following:  BP reading is greater than 140 (top number) or greater than 90 (bottom number)  Stomach pain under your ribs in the middle or right side Headaches or seeing spots Feeling nauseated or throwing up Swelling in face and hands  Zone 3: EMERGENCY  Seek immediate medical care if you have any of the following:  BP reading is greater than160 (top number) or greater than 110 (bottom number) Severe headaches not improving with Tylenol Serious difficulty catching your breath Any worsening symptoms from Zone 2  Preterm Labor and Birth Information  The normal length of a pregnancy is 39-41 weeks. Preterm labor is when labor starts before 37 completed weeks of pregnancy. What are the risk factors for preterm labor? Preterm labor is more likely to occur in women who: Have certain infections during pregnancy such as a bladder infection, sexually transmitted infection, or infection inside the uterus (chorioamnionitis). Have a shorter-than-normal cervix. Have gone into preterm labor before. Have had surgery on their cervix. Are younger than age 44  or older than age 52. Are African American. Are pregnant with twins or multiple babies (multiple gestation). Take street drugs or smoke while pregnant. Do not gain enough weight while pregnant. Became pregnant shortly after having been pregnant. What are the symptoms of preterm labor? Symptoms of preterm labor include: Cramps similar to those that can happen during a menstrual period. The cramps may happen with diarrhea. Pain in the abdomen or lower back. Regular uterine contractions that may feel like tightening of the abdomen. A feeling of increased pressure in the pelvis. Increased watery or bloody mucus discharge from the vagina. Water breaking (ruptured amniotic sac). Why is it important to recognize signs of preterm labor? It is important to recognize signs of preterm labor because babies who are born prematurely may not be fully developed. This can put them at an increased risk for: Long-term (chronic) heart and lung problems. Difficulty immediately after birth with regulating body systems, including blood sugar, body temperature, heart rate, and breathing rate. Bleeding in the brain. Cerebral palsy. Learning difficulties. Death. These risks are highest for babies who are born before 34 weeks  of pregnancy. How is preterm labor treated? Treatment depends on the length of your pregnancy, your condition, and the health of your baby. It may involve: Having a stitch (suture) placed in your cervix to prevent your cervix from opening too early (cerclage). Taking or being given medicines, such as: Hormone medicines. These may be given early in pregnancy to help support the pregnancy. Medicine to stop contractions. Medicines to help mature the baby's lungs. These may be prescribed if the risk of delivery is high. Medicines to prevent your baby from developing cerebral palsy. If the labor happens before 34 weeks of pregnancy, you may need to stay in the hospital. What should I do if I  think I am in preterm labor? If you think that you are going into preterm labor, call your health care provider right away. How can I prevent preterm labor in future pregnancies? To increase your chance of having a full-term pregnancy: Do not use any tobacco products, such as cigarettes, chewing tobacco, and e-cigarettes. If you need help quitting, ask your health care provider. Do not use street drugs or medicines that have not been prescribed to you during your pregnancy. Talk with your health care provider before taking any herbal supplements, even if you have been taking them regularly. Make sure you gain a healthy amount of weight during your pregnancy. Watch for infection. If you think that you might have an infection, get it checked right away. Make sure to tell your health care provider if you have gone into preterm labor before. This information is not intended to replace advice given to you by your health care provider. Make sure you discuss any questions you have with your health care provider. Document Revised: 04/03/2019 Document Reviewed: 05/02/2016 Elsevier Patient Education  2020 [employer].

## 2023-11-25 NOTE — Progress Notes (Signed)
LOW-RISK PREGNANCY VISIT Patient name: Catherine Park MRN [id number]  Date of birth: 03/01/1996 Chief Complaint:   No chief complaint on file.  History of Present Illness:   Catherine Park is a 27 y.o. G60P1001 female at [redacted]w[redacted]d with an Estimated Date of Delivery: 02/02/24 being seen today for ongoing management of a low-risk pregnancy.   Today she reports  some reflux and nausea, takes TUMS and helps. Declines nausea meds. . Contractions: Not present.  .  Movement: Present. denies leaking of fluid.     11/12/2023    8:47 AM 07/23/2023   10:42 AM 08/17/2021    9:08 AM 05/10/2021    3:15 PM 10/07/2019   11:27 AM  Depression screen PHQ 2/9  Decreased Interest 0 0 0 0 0  Down, Depressed, Hopeless 0 0 0 0 0  PHQ - 2 Score 0 0 0 0 0  Altered sleeping 1 0 1 1   Tired, decreased energy 1 0 1 0   Change in appetite 0 0 1 1   Feeling bad or failure about yourself  0 0 0 0   Trouble concentrating 1 0 1 1   Moving slowly or fidgety/restless 0 0 0 0   Suicidal thoughts 0 0 0 0   PHQ-9 Score 3 0 4 3         11/12/2023    8:49 AM 07/23/2023   10:42 AM 08/17/2021    9:08 AM 05/10/2021    3:16 PM  GAD 7 : Generalized Anxiety Score  Nervous, Anxious, on Edge 0 0 0 1  Control/stop worrying 0 0 0 1  Worry too much - different things 0 0 0 1  Trouble relaxing 1 0 0 0  Restless 1 0 0 1  Easily annoyed or irritable 1 0 0 1  Afraid - awful might happen 0 0 0 0  Total GAD 7 Score 3 0 0 5      Review of Systems:   Pertinent items are noted in HPI Denies abnormal vaginal discharge w/ itching/odor/irritation, headaches, visual changes, shortness of breath, chest pain, abdominal pain, severe nausea/vomiting, or problems with urination or bowel movements unless otherwise stated above. Pertinent History Reviewed:  Reviewed past medical,surgical, social, obstetrical and family history.  Reviewed problem list, medications and allergies. Physical Assessment:   Vitals:   11/25/23  1512  BP: 124/70  Weight: 195 lb (88.5 kg)  Body mass index is 35.67 kg/m.        Physical Examination:   General appearance: Well appearing, and in no distress  Mental status: Alert, oriented to person, place, and time  Skin: Warm & dry  Cardiovascular: Normal heart rate noted  Respiratory: Normal respiratory effort, no distress  Abdomen: Soft, gravid, nontender  Pelvic: Cervical exam deferred         Extremities: Edema: Trace  Fetal Status: Fetal Heart Rate (bpm): 140 Fundal Height: 30 cm Movement: Present    Chaperone: N/A   No results found for this or any previous visit (from the past 24 hour(s)).  Assessment & Plan:  1) Low-risk pregnancy G2P1001 at [redacted]w[redacted]d with an Estimated Date of Delivery: 02/02/24   2) Reflux/nausea, can take daily otc antacid if needed, if decides for nausea meds let [place] know  3) Plans waterbirth> has attended class, hasn't sent certificate yet. Send today via MyChart   Meds: No orders of the defined types were placed in this encounter.  Labs/procedures today: none  Plan:  Continue routine obstetrical  care  Next visit: prefers in person    Reviewed: Preterm labor symptoms and general obstetric precautions including but not limited to vaginal bleeding, contractions, leaking of fluid and fetal movement were reviewed in detail with the patient.  All questions were answered. Does have home bp cuff. Office bp cuff given: not applicable. Check bp weekly, let us know if consistently >140 and/or >90.  Follow-up: Return for As scheduled.  Future Appointments  Date Time Provider Department Center  12/09/2023  8:50 AM Cheral Marker, CNM [facility]  12/23/2023  9:10 AM Cheral Marker, CNM [facility]  01/06/2024  8:30 AM Cheral Marker, CNM [facility]  01/13/2024  8:30 AM Jacklyn Shell, CNM [facility]  01/20/2024  8:30 AM Cheral Marker, CNM [facility]    No orders of the defined types were placed in this  encounter.  Cheral Marker CNM, [facility] 11/25/2023 3:19 PM

## 2023-11-28 ENCOUNTER — Encounter: Payer: Self-pay | Admitting: Women's Health

## 2023-12-09 ENCOUNTER — Encounter: Payer: Self-pay | Admitting: Women's Health

## 2023-12-09 ENCOUNTER — Ambulatory Visit (INDEPENDENT_AMBULATORY_CARE_PROVIDER_SITE_OTHER): Payer: Medicaid Other | Admitting: Women's Health

## 2023-12-09 VITALS — BP 130/69 | HR 107 | Wt 199.0 lb

## 2023-12-09 DIAGNOSIS — M5442 Lumbago with sciatica, left side: Secondary | ICD-10-CM | POA: Diagnosis not present

## 2023-12-09 DIAGNOSIS — Z3A32 32 weeks gestation of pregnancy: Secondary | ICD-10-CM

## 2023-12-09 DIAGNOSIS — M9903 Segmental and somatic dysfunction of lumbar region: Secondary | ICD-10-CM | POA: Diagnosis not present

## 2023-12-09 DIAGNOSIS — Z3483 Encounter for supervision of other normal pregnancy, third trimester: Secondary | ICD-10-CM

## 2023-12-09 DIAGNOSIS — M9905 Segmental and somatic dysfunction of pelvic region: Secondary | ICD-10-CM | POA: Diagnosis not present

## 2023-12-09 DIAGNOSIS — M9902 Segmental and somatic dysfunction of thoracic region: Secondary | ICD-10-CM | POA: Diagnosis not present

## 2023-12-09 DIAGNOSIS — Z3480 Encounter for supervision of other normal pregnancy, unspecified trimester: Secondary | ICD-10-CM

## 2023-12-09 NOTE — Patient Instructions (Signed)
Catherine Park, thank you for choosing our office today! We appreciate the opportunity to meet your healthcare needs. You may receive a short survey by mail, e-mail, or through [employer]. If you are happy with your care we would appreciate if you could take just a few minutes to complete the survey questions. We read all of your comments and take your feedback very seriously. Thank you again for choosing our office.  Center for [employer] Team at [facility]  [facility] at [facility] ([address] [place], [place] [address]) Entrance C, located off of [address] Free 24/7 valet parking   CLASSES: Go to [email or web address] to register for classes (childbirth, breastfeeding, waterbirth, infant CPR, daddy bootcamp, etc.)  Call the office [phone]) or go to [facility] if: You begin to have strong, frequent contractions Your water breaks.  Sometimes it is a big gush of fluid, sometimes it is just a trickle that keeps getting your panties wet or running down your legs You have vaginal bleeding.  It is normal to have a small amount of spotting if your cervix was checked.  You don't feel your baby moving like normal.  If you don't, get you something to eat and drink and lay down and focus on feeling your baby move.   If your baby is still not moving like normal, you should call the office or go to [facility].  Call the office [phone]) or go to [facility] hospital for these signs of pre-eclampsia: Severe headache that does not go away with Tylenol Visual changes- seeing spots, double, blurred vision Pain under your right breast or upper abdomen that does not go away with Tums or heartburn medicine Nausea and/or vomiting Severe swelling in your hands, feet, and face   Tdap Vaccine It is recommended that you get the Tdap vaccine during the third trimester of EACH pregnancy to help protect your baby from getting pertussis (whooping cough) 27-36 weeks is the BEST time to do  this so that you can pass the protection on to your baby. During pregnancy is better than after pregnancy, but if you are unable to get it during pregnancy it will be offered at the hospital.  You can get this vaccine with [place], at the health department, your family doctor, or some local pharmacies Everyone who will be around your baby should also be up-to-date on their vaccines before the baby comes. Adults (who are not pregnant) only need 1 dose of Tdap during adulthood.   [facility] Pediatricians/Family Doctors [facility]): [address] Colette Ribas, [phone]           [facility]: [address], [phone]                [facility]): [address], [phone] (call to ask if accepting patients) [facility] Department: [address], [place], [phone]    [facility] Pediatricians/Family Doctors [facility]): [phone] [address], [phone] [facility]: [address] [place], [phone] [facility]: [address], [phone]  [facility] Doctors  [facility]): [phone] [facility]: [address] [phone]   [facility] Doctors [facility]: [address], [phone]  [facility] Family Doctors  [facility]: [phone], [phone]  Home Blood Pressure Monitoring for Patients   Your provider has recommended that you check your  blood pressure (BP) at least once a week at home. If you do not have a blood pressure cuff at home, one will be provided for you. Contact your provider if you have not received your monitor within 1 week.   Helpful Tips for Accurate Home Blood Pressure Checks  Don't smoke, exercise, or drink caffeine 30 minutes before checking your BP Use the restroom before checking your BP (a full bladder can raise your  pressure) Relax in a comfortable upright chair Feet on the ground Left arm resting comfortably on a flat surface at the level of your heart Legs uncrossed Back supported Sit quietly and don't talk Place the cuff on your bare arm Adjust snuggly, so that only two fingertips can fit between your skin and the top of the cuff Check 2 readings separated by at least one minute Keep a log of your BP readings For a visual, please reference this diagram: http://ccnc.care/bpdiagram  Provider Name: [facility]     Phone: [phone]  Zone 1: ALL CLEAR  Continue to monitor your symptoms:  BP reading is less than 140 (top number) or less than 90 (bottom number)  No right upper stomach pain No headaches or seeing spots No feeling nauseated or throwing up No swelling in face and hands  Zone 2: CAUTION Call your doctor's office for any of the following:  BP reading is greater than 140 (top number) or greater than 90 (bottom number)  Stomach pain under your ribs in the middle or right side Headaches or seeing spots Feeling nauseated or throwing up Swelling in face and hands  Zone 3: EMERGENCY  Seek immediate medical care if you have any of the following:  BP reading is greater than160 (top number) or greater than 110 (bottom number) Severe headaches not improving with Tylenol Serious difficulty catching your breath Any worsening symptoms from Zone 2  Preterm Labor and Birth Information  The normal length of a pregnancy is 39-41 weeks. Preterm labor is when labor starts before 37 completed weeks of pregnancy. What are the risk factors for preterm labor? Preterm labor is more likely to occur in women who: Have certain infections during pregnancy such as a bladder infection, sexually transmitted infection, or infection inside the uterus (chorioamnionitis). Have a shorter-than-normal cervix. Have gone into preterm labor before. Have had surgery on their cervix. Are younger than age 44  or older than age 52. Are African American. Are pregnant with twins or multiple babies (multiple gestation). Take street drugs or smoke while pregnant. Do not gain enough weight while pregnant. Became pregnant shortly after having been pregnant. What are the symptoms of preterm labor? Symptoms of preterm labor include: Cramps similar to those that can happen during a menstrual period. The cramps may happen with diarrhea. Pain in the abdomen or lower back. Regular uterine contractions that may feel like tightening of the abdomen. A feeling of increased pressure in the pelvis. Increased watery or bloody mucus discharge from the vagina. Water breaking (ruptured amniotic sac). Why is it important to recognize signs of preterm labor? It is important to recognize signs of preterm labor because babies who are born prematurely may not be fully developed. This can put them at an increased risk for: Long-term (chronic) heart and lung problems. Difficulty immediately after birth with regulating body systems, including blood sugar, body temperature, heart rate, and breathing rate. Bleeding in the brain. Cerebral palsy. Learning difficulties. Death. These risks are highest for babies who are born before 34 weeks  of pregnancy. How is preterm labor treated? Treatment depends on the length of your pregnancy, your condition, and the health of your baby. It may involve: Having a stitch (suture) placed in your cervix to prevent your cervix from opening too early (cerclage). Taking or being given medicines, such as: Hormone medicines. These may be given early in pregnancy to help support the pregnancy. Medicine to stop contractions. Medicines to help mature the baby's lungs. These may be prescribed if the risk of delivery is high. Medicines to prevent your baby from developing cerebral palsy. If the labor happens before 34 weeks of pregnancy, you may need to stay in the hospital. What should I do if I  think I am in preterm labor? If you think that you are going into preterm labor, call your health care provider right away. How can I prevent preterm labor in future pregnancies? To increase your chance of having a full-term pregnancy: Do not use any tobacco products, such as cigarettes, chewing tobacco, and e-cigarettes. If you need help quitting, ask your health care provider. Do not use street drugs or medicines that have not been prescribed to you during your pregnancy. Talk with your health care provider before taking any herbal supplements, even if you have been taking them regularly. Make sure you gain a healthy amount of weight during your pregnancy. Watch for infection. If you think that you might have an infection, get it checked right away. Make sure to tell your health care provider if you have gone into preterm labor before. This information is not intended to replace advice given to you by your health care provider. Make sure you discuss any questions you have with your health care provider. Document Revised: 04/03/2019 Document Reviewed: 05/02/2016 Elsevier Patient Education  2020 [employer].

## 2023-12-09 NOTE — Progress Notes (Signed)
LOW-RISK PREGNANCY VISIT Patient name: Catherine Park MRN [id number]  Date of birth: 05/30/1996 Chief Complaint:   Routine Prenatal Visit (Tingling in fingertips)  History of Present Illness:   Catherine Park is a 27 y.o. G49P1001 female at [redacted]w[redacted]d with an Estimated Date of Delivery: 02/02/24 being seen today for ongoing management of a low-risk pregnancy.   Today she reports  tingling fingers . Contractions: Irritability. Vag. Bleeding: None.  Movement: Present. denies leaking of fluid.     11/12/2023    8:47 AM 07/23/2023   10:42 AM 08/17/2021    9:08 AM 05/10/2021    3:15 PM 10/07/2019   11:27 AM  Depression screen PHQ 2/9  Decreased Interest 0 0 0 0 0  Down, Depressed, Hopeless 0 0 0 0 0  PHQ - 2 Score 0 0 0 0 0  Altered sleeping 1 0 1 1   Tired, decreased energy 1 0 1 0   Change in appetite 0 0 1 1   Feeling bad or failure about yourself  0 0 0 0   Trouble concentrating 1 0 1 1   Moving slowly or fidgety/restless 0 0 0 0   Suicidal thoughts 0 0 0 0   PHQ-9 Score 3 0 4 3         11/12/2023    8:49 AM 07/23/2023   10:42 AM 08/17/2021    9:08 AM 05/10/2021    3:16 PM  GAD 7 : Generalized Anxiety Score  Nervous, Anxious, on Edge 0 0 0 1  Control/stop worrying 0 0 0 1  Worry too much - different things 0 0 0 1  Trouble relaxing 1 0 0 0  Restless 1 0 0 1  Easily annoyed or irritable 1 0 0 1  Afraid - awful might happen 0 0 0 0  Total GAD 7 Score 3 0 0 5      Review of Systems:   Pertinent items are noted in HPI Denies abnormal vaginal discharge w/ itching/odor/irritation, headaches, visual changes, shortness of breath, chest pain, abdominal pain, severe nausea/vomiting, or problems with urination or bowel movements unless otherwise stated above. Pertinent History Reviewed:  Reviewed past medical,surgical, social, obstetrical and family history.  Reviewed problem list, medications and allergies. Physical Assessment:   Vitals:   12/09/23 0843  BP:  130/69  Pulse: (!) 107  Weight: 199 lb (90.3 kg)  Body mass index is 36.4 kg/m.        Physical Examination:   General appearance: Well appearing, and in no distress  Mental status: Alert, oriented to person, place, and time  Skin: Warm & dry  Cardiovascular: Normal heart rate noted  Respiratory: Normal respiratory effort, no distress  Abdomen: Soft, gravid, nontender  Pelvic: Cervical exam deferred         Extremities: Edema: Trace  Fetal Status: Fetal Heart Rate (bpm): 143 Fundal Height: 31 cm Movement: Present    Chaperone: N/A   No results found for this or any previous visit (from the past 24 hours).  Assessment & Plan:  1) Low-risk pregnancy G2P1001 at [redacted]w[redacted]d with an Estimated Date of Delivery: 02/02/24   2) CTS sx, try wrist splints  3) Plans waterbirth> has done class/certificate   Meds: No orders of the defined types were placed in this encounter.  Labs/procedures today: none  Plan:  Continue routine obstetrical care  Next visit: prefers in person    Reviewed: Preterm labor symptoms and general obstetric precautions including but not limited to  vaginal bleeding, contractions, leaking of fluid and fetal movement were reviewed in detail with the patient.  All questions were answered. Does have home bp cuff. Office bp cuff given: not applicable. Check bp weekly, let us know if consistently >140 and/or >90.  Follow-up: Return for As scheduled.  Future Appointments  Date Time Provider Department Center  12/23/2023  9:10 AM Cheral Marker, CNM [facility]  01/06/2024  8:30 AM Cheral Marker, CNM [facility]  01/13/2024  8:30 AM Jacklyn Shell, CNM [facility]  01/20/2024  8:30 AM Cheral Marker, CNM [facility]  01/27/2024  8:50 AM Cheral Marker, CNM [facility]  02/03/2024  9:10 AM Cheral Marker, CNM [facility]  02/03/2024  9:10 AM [facility] NURSE [facility]    No orders of the defined types were placed in this  encounter.  Cheral Marker CNM, [facility] 12/09/2023 9:04 AM

## 2023-12-23 ENCOUNTER — Ambulatory Visit: Payer: Medicaid Other | Admitting: Women's Health

## 2023-12-23 ENCOUNTER — Encounter: Payer: Self-pay | Admitting: Women's Health

## 2023-12-23 VITALS — BP 121/80 | HR 107 | Wt 200.2 lb

## 2023-12-23 DIAGNOSIS — Z2911 Encounter for prophylactic immunotherapy for respiratory syncytial virus (RSV): Secondary | ICD-10-CM | POA: Diagnosis not present

## 2023-12-23 DIAGNOSIS — Z3492 Encounter for supervision of normal pregnancy, unspecified, second trimester: Secondary | ICD-10-CM | POA: Diagnosis not present

## 2023-12-23 DIAGNOSIS — Z3483 Encounter for supervision of other normal pregnancy, third trimester: Secondary | ICD-10-CM

## 2023-12-23 DIAGNOSIS — Z3A34 34 weeks gestation of pregnancy: Secondary | ICD-10-CM

## 2023-12-23 DIAGNOSIS — Z3480 Encounter for supervision of other normal pregnancy, unspecified trimester: Secondary | ICD-10-CM

## 2023-12-23 NOTE — Progress Notes (Signed)
LOW-RISK PREGNANCY VISIT Patient name: Catherine Park MRN [id number]  Date of birth: [birth year] Chief Complaint:   Routine Prenatal Visit  History of Present Illness:   Catherine Park is a 27 y.o. G99P1001 female at [redacted]w[redacted]d with an Estimated Date of Delivery: 02/02/24 being seen today for ongoing management of a low-risk pregnancy.   Today she reports no complaints. Contractions: Not present. Vag. Bleeding: None.  Movement: Present. denies leaking of fluid.     11/12/2023    8:47 AM 07/23/2023   10:42 AM 08/17/2021    9:08 AM 05/10/2021    3:15 PM 10/07/2019   11:27 AM  Depression screen PHQ 2/9  Decreased Interest 0 0 0 0 0  Down, Depressed, Hopeless 0 0 0 0 0  PHQ - 2 Score 0 0 0 0 0  Altered sleeping 1 0 1 1   Tired, decreased energy 1 0 1 0   Change in appetite 0 0 1 1   Feeling bad or failure about yourself  0 0 0 0   Trouble concentrating 1 0 1 1   Moving slowly or fidgety/restless 0 0 0 0   Suicidal thoughts 0 0 0 0   PHQ-9 Score 3 0 4 3         11/12/2023    8:49 AM 07/23/2023   10:42 AM 08/17/2021    9:08 AM 05/10/2021    3:16 PM  GAD 7 : Generalized Anxiety Score  Nervous, Anxious, on Edge 0 0 0 1  Control/stop worrying 0 0 0 1  Worry too much - different things 0 0 0 1  Trouble relaxing 1 0 0 0  Restless 1 0 0 1  Easily annoyed or irritable 1 0 0 1  Afraid - awful might happen 0 0 0 0  Total GAD 7 Score 3 0 0 5      Review of Systems:   Pertinent items are noted in HPI Denies abnormal vaginal discharge w/ itching/odor/irritation, headaches, visual changes, shortness of breath, chest pain, abdominal pain, severe nausea/vomiting, or problems with urination or bowel movements unless otherwise stated above. Pertinent History Reviewed:  Reviewed past medical,surgical, social, obstetrical and family history.  Reviewed problem list, medications and allergies. Physical Assessment:   Vitals:   12/23/23 0916  BP: 121/80  Pulse: (!) 107  Weight:  200 lb 3.2 oz (90.8 kg)  Body mass index is 36.62 kg/m.        Physical Examination:   General appearance: Well appearing, and in no distress  Mental status: Alert, oriented to person, place, and time  Skin: Warm & dry  Cardiovascular: Normal heart rate noted  Respiratory: Normal respiratory effort, no distress  Abdomen: Soft, gravid, nontender  Pelvic: Cervical exam deferred         Extremities: Edema: None  Fetal Status: Fetal Heart Rate (bpm): 145 Fundal Height: 34 cm Movement: Present    Chaperone: N/A   No results found for this or any previous visit (from the past 24 hours).  Assessment & Plan:  1) Low-risk pregnancy G2P1001 at [redacted]w[redacted]d with an Estimated Date of Delivery: 02/02/24   2) Plans waterbirth> has done class/certificate    Meds: No orders of the defined types were placed in this encounter.  Labs/procedures today: RSV vaccine  Plan:  Continue routine obstetrical care  Next visit: prefers will be in person for cultures     Reviewed: Preterm labor symptoms and general obstetric precautions including but not limited to vaginal bleeding,  contractions, leaking of fluid and fetal movement were reviewed in detail with the patient.  All questions were answered. Does have home bp cuff. Office bp cuff given: not applicable. Check bp weekly, let us know if consistently >140 and/or >90.  Follow-up: Return for As scheduled.  Future Appointments  Date Time Provider Department Center  01/06/2024  8:30 AM Cheral Marker, CNM [facility]  01/13/2024  8:30 AM Jacklyn Shell, CNM [facility]  01/20/2024  8:30 AM Cheral Marker, CNM [facility]  01/27/2024  8:50 AM Cheral Marker, CNM [facility]  02/03/2024  9:10 AM Cheral Marker, CNM [facility]  02/03/2024  9:10 AM [facility] NURSE [facility]    No orders of the defined types were placed in this encounter.  Cheral Marker CNM, [facility] 12/23/2023 9:29 AM

## 2023-12-23 NOTE — Patient Instructions (Signed)
[name], thank you for choosing our office today! We appreciate the opportunity to meet your healthcare needs. You may receive a short survey by mail, e-mail, or through [employer]. If you are happy with your care we would appreciate if you could take just a few minutes to complete the survey questions. We read all of your comments and take your feedback very seriously. Thank you again for choosing our office.  Center for [employer] Team at [facility]  [facility] at [facility] ([address] [place], [place] [address]) Entrance C, located off of [address] Free 24/7 valet parking   CLASSES: Go to [email or web address] to register for classes (childbirth, breastfeeding, waterbirth, infant CPR, daddy bootcamp, etc.)  Call the office [phone]) or go to [facility] if: You begin to have strong, frequent contractions Your water breaks.  Sometimes it is a big gush of fluid, sometimes it is just a trickle that keeps getting your panties wet or running down your legs You have vaginal bleeding.  It is normal to have a small amount of spotting if your cervix was checked.  You don't feel your baby moving like normal.  If you don't, get you something to eat and drink and lay down and focus on feeling your baby move.   If your baby is still not moving like normal, you should call the office or go to [facility].  Call the office [phone]) or go to [facility] hospital for these signs of pre-eclampsia: Severe headache that does not go away with Tylenol Visual changes- seeing spots, double, blurred vision Pain under your right breast or upper abdomen that does not go away with Tums or heartburn medicine Nausea and/or vomiting Severe swelling in your hands, feet, and face   Tdap Vaccine It is recommended that you get the Tdap vaccine during the third trimester of EACH pregnancy to help protect your baby from getting pertussis (whooping cough) 27-36 weeks is the BEST time to do  this so that you can pass the protection on to your baby. During pregnancy is better than after pregnancy, but if you are unable to get it during pregnancy it will be offered at the hospital.  You can get this vaccine with [place], at the health department, your family doctor, or some local pharmacies Everyone who will be around your baby should also be up-to-date on their vaccines before the baby comes. Adults (who are not pregnant) only need 1 dose of Tdap during adulthood.   [facility] Pediatricians/Family Doctors [facility]): [address] Colette Ribas, [phone]           [facility]: [address], [phone]                [facility]): [address], [phone] (call to ask if accepting patients) [facility] Department: [address], [place], [phone]    [facility] Pediatricians/Family Doctors [facility]): [phone] [address], [phone] [facility]: [address] [place], [phone] [facility]: [address], [phone]  [facility] Doctors  [facility]): [phone] [facility]: [address] [phone]   [facility] Doctors [facility]: [address], [phone]  [facility] Family Doctors  [facility]: [phone], [phone]  Home Blood Pressure Monitoring for Patients   Your provider has recommended that you check your  blood pressure (BP) at least once a week at home. If you do not have a blood pressure cuff at home, one will be provided for you. Contact your provider if you have not received your monitor within 1 week.   Helpful Tips for Accurate Home Blood Pressure Checks  Don't smoke, exercise, or drink caffeine 30 minutes before checking your BP Use the restroom before checking your BP (a full bladder can raise your  pressure) Relax in a comfortable upright chair Feet on the ground Left arm resting comfortably on a flat surface at the level of your heart Legs uncrossed Back supported Sit quietly and don't talk Place the cuff on your bare arm Adjust snuggly, so that only two fingertips can fit between your skin and the top of the cuff Check 2 readings separated by at least one minute Keep a log of your BP readings For a visual, please reference this diagram: http://ccnc.care/bpdiagram  Provider Name: [facility]     Phone: [phone]  Zone 1: ALL CLEAR  Continue to monitor your symptoms:  BP reading is less than 140 (top number) or less than 90 (bottom number)  No right upper stomach pain No headaches or seeing spots No feeling nauseated or throwing up No swelling in face and hands  Zone 2: CAUTION Call your doctor's office for any of the following:  BP reading is greater than 140 (top number) or greater than 90 (bottom number)  Stomach pain under your ribs in the middle or right side Headaches or seeing spots Feeling nauseated or throwing up Swelling in face and hands  Zone 3: EMERGENCY  Seek immediate medical care if you have any of the following:  BP reading is greater than160 (top number) or greater than 110 (bottom number) Severe headaches not improving with Tylenol Serious difficulty catching your breath Any worsening symptoms from Zone 2  Preterm Labor and Birth Information  The normal length of a pregnancy is 39-41 weeks. Preterm labor is when labor starts before 37 completed weeks of pregnancy. What are the risk factors for preterm labor? Preterm labor is more likely to occur in women who: Have certain infections during pregnancy such as a bladder infection, sexually transmitted infection, or infection inside the uterus (chorioamnionitis). Have a shorter-than-normal cervix. Have gone into preterm labor before. Have had surgery on their cervix. Are younger than age 44  or older than age 52. Are African American. Are pregnant with twins or multiple babies (multiple gestation). Take street drugs or smoke while pregnant. Do not gain enough weight while pregnant. Became pregnant shortly after having been pregnant. What are the symptoms of preterm labor? Symptoms of preterm labor include: Cramps similar to those that can happen during a menstrual period. The cramps may happen with diarrhea. Pain in the abdomen or lower back. Regular uterine contractions that may feel like tightening of the abdomen. A feeling of increased pressure in the pelvis. Increased watery or bloody mucus discharge from the vagina. Water breaking (ruptured amniotic sac). Why is it important to recognize signs of preterm labor? It is important to recognize signs of preterm labor because babies who are born prematurely may not be fully developed. This can put them at an increased risk for: Long-term (chronic) heart and lung problems. Difficulty immediately after birth with regulating body systems, including blood sugar, body temperature, heart rate, and breathing rate. Bleeding in the brain. Cerebral palsy. Learning difficulties. Death. These risks are highest for babies who are born before 34 weeks  of pregnancy. How is preterm labor treated? Treatment depends on the length of your pregnancy, your condition, and the health of your baby. It may involve: Having a stitch (suture) placed in your cervix to prevent your cervix from opening too early (cerclage). Taking or being given medicines, such as: Hormone medicines. These may be given early in pregnancy to help support the pregnancy. Medicine to stop contractions. Medicines to help mature the baby's lungs. These may be prescribed if the risk of delivery is high. Medicines to prevent your baby from developing cerebral palsy. If the labor happens before 34 weeks of pregnancy, you may need to stay in the hospital. What should I do if I  think I am in preterm labor? If you think that you are going into preterm labor, call your health care provider right away. How can I prevent preterm labor in future pregnancies? To increase your chance of having a full-term pregnancy: Do not use any tobacco products, such as cigarettes, chewing tobacco, and e-cigarettes. If you need help quitting, ask your health care provider. Do not use street drugs or medicines that have not been prescribed to you during your pregnancy. Talk with your health care provider before taking any herbal supplements, even if you have been taking them regularly. Make sure you gain a healthy amount of weight during your pregnancy. Watch for infection. If you think that you might have an infection, get it checked right away. Make sure to tell your health care provider if you have gone into preterm labor before. This information is not intended to replace advice given to you by your health care provider. Make sure you discuss any questions you have with your health care provider. Document Revised: 04/03/2019 Document Reviewed: 05/02/2016 Elsevier Patient Education  2020 [employer].

## 2023-12-25 DIAGNOSIS — Z419 Encounter for procedure for purposes other than remedying health state, unspecified: Secondary | ICD-10-CM | POA: Diagnosis not present

## 2023-12-25 NOTE — L&D Delivery Note (Addendum)
OB/GYN Faculty Practice Delivery Note  Catherine Park is a 28 y.o. G2P1001 [redacted]w[redacted]d. She was admitted for SOL.   ROM: 0h 110m with clear fluid GBS Status: neg Maximum Maternal Temperature: 98.2   Delivery Date/Time: 01/19/2024 @ 1958 Delivery: Patient came up to L&D 9.5cm with bulging bag. Consented patient to AROM. Patient pushed over 2 contractions. Head delivered OA. No nuchal cord present. Shoulder and body delivered in usual fashion. Infant with spontaneous cry, placed on mother's abdomen, dried and stimulated. Cord clamped x 2 after 1-minute delay, and cut by father of the baby. Cord blood drawn. Placenta delivered spontaneously with trailing membranes, appeared intact, with 3-vessel cord. Fundus firm with massage and IM Pitocin. Labia, perineum, vagina, and cervix inspected, intact.   Placenta: To L&D Complications: None Lacerations: intact EBL: 173 Analgesia: None  Postpartum Planning [x]  transfer orders to MB [x ] discharge summary started & shared [x ] message to sent to schedule follow-up  [x ] lists updated  Infant: baby girl "[name]"  APGARs 9/9  weight pending  Quin Hoop, SNM  01/19/2024, 8:20 PM  Preceptor was present for the entire delivery of baby and placenta and inspection of perineum and agree with above.  Katrinka Blazing, [place] 01/19/2024 8:31 PM

## 2024-01-06 ENCOUNTER — Other Ambulatory Visit (HOSPITAL_COMMUNITY)
Admission: RE | Admit: 2024-01-06 | Discharge: 2024-01-06 | Disposition: A | Discharge status: Home / Self Care | Payer: Medicaid Other | Source: Ambulatory Visit | Attending: Women's Health | Admitting: Women's Health

## 2024-01-06 ENCOUNTER — Ambulatory Visit (INDEPENDENT_AMBULATORY_CARE_PROVIDER_SITE_OTHER): Payer: Medicaid Other | Admitting: Women's Health

## 2024-01-06 ENCOUNTER — Encounter: Payer: Self-pay | Admitting: Women's Health

## 2024-01-06 VITALS — BP 118/76 | HR 102 | Wt 202.0 lb

## 2024-01-06 DIAGNOSIS — Z3483 Encounter for supervision of other normal pregnancy, third trimester: Secondary | ICD-10-CM | POA: Insufficient documentation

## 2024-01-06 DIAGNOSIS — Z3480 Encounter for supervision of other normal pregnancy, unspecified trimester: Secondary | ICD-10-CM

## 2024-01-06 DIAGNOSIS — Z3A36 36 weeks gestation of pregnancy: Secondary | ICD-10-CM | POA: Insufficient documentation

## 2024-01-06 DIAGNOSIS — Z3493 Encounter for supervision of normal pregnancy, unspecified, third trimester: Secondary | ICD-10-CM | POA: Diagnosis not present

## 2024-01-06 NOTE — Progress Notes (Signed)
 LOW-RISK PREGNANCY VISIT Patient name: Catherine Park MRN [id number]  Date of birth: October 05, 1996 Chief Complaint:   Routine Prenatal Visit (culture)  History of Present Illness:   Catherine Park is a 28 y.o. G75P1001 female at [redacted]w[redacted]d with an Estimated Date of Delivery: 02/02/24 being seen today for ongoing management of a low-risk pregnancy.   Today she reports  upper abd tightened last night, then got a little dizzy and started seeing stars . Contractions: Not present.  .  Movement: Present. denies leaking of fluid.     11/12/2023    8:47 AM 07/23/2023   10:42 AM 08/17/2021    9:08 AM 05/10/2021    3:15 PM 10/07/2019   11:27 AM  Depression screen PHQ 2/9  Decreased Interest 0 0 0 0 0  Down, Depressed, Hopeless 0 0 0 0 0  PHQ - 2 Score 0 0 0 0 0  Altered sleeping 1 0 1 1   Tired, decreased energy 1 0 1 0   Change in appetite 0 0 1 1   Feeling bad or failure about yourself  0 0 0 0   Trouble concentrating 1 0 1 1   Moving slowly or fidgety/restless 0 0 0 0   Suicidal thoughts 0 0 0 0   PHQ-9 Score 3 0 4 3         11/12/2023    8:49 AM 07/23/2023   10:42 AM 08/17/2021    9:08 AM 05/10/2021    3:16 PM  GAD 7 : Generalized Anxiety Score  Nervous, Anxious, on Edge 0 0 0 1  Control/stop worrying 0 0 0 1  Worry too much - different things 0 0 0 1  Trouble relaxing 1 0 0 0  Restless 1 0 0 1  Easily annoyed or irritable 1 0 0 1  Afraid - awful might happen 0 0 0 0  Total GAD 7 Score 3 0 0 5      Review of Systems:   Pertinent items are noted in HPI Denies abnormal vaginal discharge w/ itching/odor/irritation, headaches, visual changes, shortness of breath, chest pain, abdominal pain, severe nausea/vomiting, or problems with urination or bowel movements unless otherwise stated above. Pertinent History Reviewed:  Reviewed past medical,surgical, social, obstetrical and family history.  Reviewed problem list, medications and allergies. Physical Assessment:    Vitals:   01/06/24 0836  BP: 118/76  Pulse: (!) 102  Weight: 202 lb (91.6 kg)  Body mass index is 36.95 kg/m.        Physical Examination:   General appearance: Well appearing, and in no distress  Mental status: Alert, oriented to person, place, and time  Skin: Warm & dry  Cardiovascular: Normal heart rate noted  Respiratory: Normal respiratory effort, no distress  Abdomen: Soft, gravid, nontender  Pelvic: Cervical exam performed  Dilation: 1.5 Effacement (%): Thick Station: Ballotable  Extremities: Edema: None  Fetal Status: Fetal Heart Rate (bpm): 151 Fundal Height: 37 cm Movement: Present Presentation: Vertex  Chaperone: Alan Fischer   No results found for this or any previous visit (from the past 24 hours).  Assessment & Plan:  1) Low-risk pregnancy G2P1001 at [redacted]w[redacted]d with an Estimated Date of Delivery: 02/02/24   2) 1 episode of upd abd tightening then saw stars, bp wnl, had eaten a lot of carbs, discussed all  3) Plans waterbirth> went to class and sent certificate, understands providers at hospital during admission will determine eligibility    Meds: No orders of the defined  types were placed in this encounter.  Labs/procedures today: GBS, GC/CT, and SVE  Plan:  Continue routine obstetrical care  Next visit: prefers in person    Reviewed: Preterm labor symptoms and general obstetric precautions including but not limited to vaginal bleeding, contractions, leaking of fluid and fetal movement were reviewed in detail with the patient.  All questions were answered. Does have home bp cuff. Office bp cuff given: not applicable. Check bp weekly, let us  know if consistently >140 and/or >90.  Follow-up: Return for As scheduled.  Future Appointments  Date Time Provider Department Center  01/13/2024  8:30 AM Newton Mering, [place] [facility]  01/20/2024  8:30 AM Kizzie Suzen SAUNDERS, CNM [facility]  01/27/2024  8:50 AM Kizzie Suzen SAUNDERS, CNM [facility]   02/03/2024  9:10 AM Kizzie Suzen SAUNDERS, CNM [facility]  02/03/2024  9:10 AM [facility] NURSE [facility]    Orders Placed This Encounter  Procedures   Culture, beta strep (group b only)   Suzen SAUNDERS Kizzie CNM, WHNP-BC 01/06/2024 9:00 AM

## 2024-01-06 NOTE — Patient Instructions (Signed)
Catherine Park, thank you for choosing our office today! We appreciate the opportunity to meet your healthcare needs. You may receive a short survey by mail, e-mail, or through [employer]. If you are happy with your care we would appreciate if you could take just a few minutes to complete the survey questions. We read all of your comments and take your feedback very seriously. Thank you again for choosing our office.  Center for [employer] Team at [facility]  [facility] at [facility] ([address] [place], [place] [address]) Entrance C, located off of [address] Free 24/7 valet parking   CLASSES: Go to [email or web address] to register for classes (childbirth, breastfeeding, waterbirth, infant CPR, daddy bootcamp, etc.)  Call the office [phone]) or go to [facility] if: You begin to have strong, frequent contractions Your water breaks.  Sometimes it is a big gush of fluid, sometimes it is just a trickle that keeps getting your panties wet or running down your legs You have vaginal bleeding.  It is normal to have a small amount of spotting if your cervix was checked.  You don't feel your baby moving like normal.  If you don't, get you something to eat and drink and lay down and focus on feeling your baby move.   If your baby is still not moving like normal, you should call the office or go to [facility].  Call the office [phone]) or go to [facility] hospital for these signs of pre-eclampsia: Severe headache that does not go away with Tylenol Visual changes- seeing spots, double, blurred vision Pain under your right breast or upper abdomen that does not go away with Tums or heartburn medicine Nausea and/or vomiting Severe swelling in your hands, feet, and face   Tdap Vaccine It is recommended that you get the Tdap vaccine during the third trimester of EACH pregnancy to help protect your baby from getting pertussis (whooping cough) 27-36 weeks is the BEST time to do  this so that you can pass the protection on to your baby. During pregnancy is better than after pregnancy, but if you are unable to get it during pregnancy it will be offered at the hospital.  You can get this vaccine with [place], at the health department, your family doctor, or some local pharmacies Everyone who will be around your baby should also be up-to-date on their vaccines before the baby comes. Adults (who are not pregnant) only need 1 dose of Tdap during adulthood.   [facility] Pediatricians/Family Doctors [facility]): [address] Colette Ribas, [phone]           [facility]: [address], [phone]                [facility]): [address], [phone] (call to ask if accepting patients) [facility] Department: [address], [place], [phone]    [facility] Pediatricians/Family Doctors [facility]): [phone] [address], [phone] [facility]: [address] [place], [phone] [facility]: [address], [phone]  [facility] Doctors  [facility]): [phone] [facility]: [address] [phone]   [facility] Doctors [facility]: [address], [phone]  [facility] Family Doctors  [facility]: [phone], [phone]  Home Blood Pressure Monitoring for Patients   Your provider has recommended that you check your  blood pressure (BP) at least once a week at home. If you do not have a blood pressure cuff at home, one will be provided for you. Contact your provider if you have not received your monitor within 1 week.   Helpful Tips for Accurate Home Blood Pressure Checks  Don't smoke, exercise, or drink caffeine 30 minutes before checking your BP Use the restroom before checking your BP (a full bladder can raise your  pressure) Relax in a comfortable upright chair Feet on the ground Left arm resting comfortably on a flat surface at the level of your heart Legs uncrossed Back supported Sit quietly and don't talk Place the cuff on your bare arm Adjust snuggly, so that only two fingertips can fit between your skin and the top of the cuff Check 2 readings separated by at least one minute Keep a log of your BP readings For a visual, please reference this diagram: http://ccnc.care/bpdiagram  Provider Name: [facility]     Phone: [phone]  Zone 1: ALL CLEAR  Continue to monitor your symptoms:  BP reading is less than 140 (top number) or less than 90 (bottom number)  No right upper stomach pain No headaches or seeing spots No feeling nauseated or throwing up No swelling in face and hands  Zone 2: CAUTION Call your doctor's office for any of the following:  BP reading is greater than 140 (top number) or greater than 90 (bottom number)  Stomach pain under your ribs in the middle or right side Headaches or seeing spots Feeling nauseated or throwing up Swelling in face and hands  Zone 3: EMERGENCY  Seek immediate medical care if you have any of the following:  BP reading is greater than160 (top number) or greater than 110 (bottom number) Severe headaches not improving with Tylenol Serious difficulty catching your breath Any worsening symptoms from Zone 2  Preterm Labor and Birth Information  The normal length of a pregnancy is 39-41 weeks. Preterm labor is when labor starts before 37 completed weeks of pregnancy. What are the risk factors for preterm labor? Preterm labor is more likely to occur in women who: Have certain infections during pregnancy such as a bladder infection, sexually transmitted infection, or infection inside the uterus (chorioamnionitis). Have a shorter-than-normal cervix. Have gone into preterm labor before. Have had surgery on their cervix. Are younger than age 44  or older than age 52. Are African American. Are pregnant with twins or multiple babies (multiple gestation). Take street drugs or smoke while pregnant. Do not gain enough weight while pregnant. Became pregnant shortly after having been pregnant. What are the symptoms of preterm labor? Symptoms of preterm labor include: Cramps similar to those that can happen during a menstrual period. The cramps may happen with diarrhea. Pain in the abdomen or lower back. Regular uterine contractions that may feel like tightening of the abdomen. A feeling of increased pressure in the pelvis. Increased watery or bloody mucus discharge from the vagina. Water breaking (ruptured amniotic sac). Why is it important to recognize signs of preterm labor? It is important to recognize signs of preterm labor because babies who are born prematurely may not be fully developed. This can put them at an increased risk for: Long-term (chronic) heart and lung problems. Difficulty immediately after birth with regulating body systems, including blood sugar, body temperature, heart rate, and breathing rate. Bleeding in the brain. Cerebral palsy. Learning difficulties. Death. These risks are highest for babies who are born before 34 weeks  of pregnancy. How is preterm labor treated? Treatment depends on the length of your pregnancy, your condition, and the health of your baby. It may involve: Having a stitch (suture) placed in your cervix to prevent your cervix from opening too early (cerclage). Taking or being given medicines, such as: Hormone medicines. These may be given early in pregnancy to help support the pregnancy. Medicine to stop contractions. Medicines to help mature the baby's lungs. These may be prescribed if the risk of delivery is high. Medicines to prevent your baby from developing cerebral palsy. If the labor happens before 34 weeks of pregnancy, you may need to stay in the hospital. What should I do if I  think I am in preterm labor? If you think that you are going into preterm labor, call your health care provider right away. How can I prevent preterm labor in future pregnancies? To increase your chance of having a full-term pregnancy: Do not use any tobacco products, such as cigarettes, chewing tobacco, and e-cigarettes. If you need help quitting, ask your health care provider. Do not use street drugs or medicines that have not been prescribed to you during your pregnancy. Talk with your health care provider before taking any herbal supplements, even if you have been taking them regularly. Make sure you gain a healthy amount of weight during your pregnancy. Watch for infection. If you think that you might have an infection, get it checked right away. Make sure to tell your health care provider if you have gone into preterm labor before. This information is not intended to replace advice given to you by your health care provider. Make sure you discuss any questions you have with your health care provider. Document Revised: 04/03/2019 Document Reviewed: 05/02/2016 Elsevier Patient Education  2020 [employer].

## 2024-01-07 LAB — CERVICOVAGINAL ANCILLARY ONLY
Chlamydia: NEGATIVE
Comment: NEGATIVE
Comment: NORMAL
Neisseria Gonorrhea: NEGATIVE

## 2024-01-09 LAB — CULTURE, BETA STREP (GROUP B ONLY): Strep Gp B Culture: NEGATIVE

## 2024-01-13 ENCOUNTER — Ambulatory Visit (INDEPENDENT_AMBULATORY_CARE_PROVIDER_SITE_OTHER): Payer: Medicaid Other | Admitting: Advanced Practice Midwife

## 2024-01-13 ENCOUNTER — Encounter: Payer: Self-pay | Admitting: Advanced Practice Midwife

## 2024-01-13 VITALS — BP 108/74 | HR 100 | Wt 202.0 lb

## 2024-01-13 DIAGNOSIS — Z3480 Encounter for supervision of other normal pregnancy, unspecified trimester: Secondary | ICD-10-CM

## 2024-01-13 DIAGNOSIS — Z3483 Encounter for supervision of other normal pregnancy, third trimester: Secondary | ICD-10-CM

## 2024-01-13 DIAGNOSIS — Z3A37 37 weeks gestation of pregnancy: Secondary | ICD-10-CM

## 2024-01-13 NOTE — Progress Notes (Signed)
   LOW-RISK PREGNANCY VISIT Patient name: Catherine Park MRN [id number]  Date of birth: 1996/10/08 Chief Complaint:   Routine Prenatal Visit  History of Present Illness:   Catherine Park is a 28 y.o. G66P1001 female at [redacted]w[redacted]d with an Estimated Date of Delivery: 02/02/24 being seen today for ongoing management of a low-risk pregnancy.  Today she reports no complaints. Contractions: Not present. Vag. Bleeding: None.  Movement: Present. denies leaking of fluid. Review of Systems:   Pertinent items are noted in HPI Denies abnormal vaginal discharge w/ itching/odor/irritation, headaches, visual changes, shortness of breath, chest pain, abdominal pain, severe nausea/vomiting, or problems with urination or bowel movements unless otherwise stated above. Pertinent History Reviewed:  Reviewed past medical,surgical, social, obstetrical and family history.  Reviewed problem list, medications and allergies. Physical Assessment:   Vitals:   01/13/24 0840  BP: 108/74  Pulse: 100  Weight: 202 lb (91.6 kg)  Body mass index is 36.95 kg/m.        Physical Examination:   General appearance: Well appearing, and in no distress  Mental status: Alert, oriented to person, place, and time  Skin: Warm & dry  Cardiovascular: Normal heart rate noted  Respiratory: Normal respiratory effort, no distress  Abdomen: Soft, gravid, nontender  Pelvic: Cervical exam performed  Dilation: 2 Effacement (%): Thick Station: -2  Extremities: Edema: None Chaperone:  Latisha Cresenzo   Fetal Status: Fetal Heart Rate (bpm): 148 Fundal Height: 38 cm Movement: Present Presentation: Vertex    No results found for this or any previous visit (from the past 24 hours).  Assessment & Plan:    Pregnancy: G2P1001 at [redacted]w[redacted]d 1. [redacted] weeks gestation of pregnancy (Primary)   2. Supervision of other normal pregnancy, antepartum      Meds: No orders of the defined types were placed in this encounter.  Labs/procedures  today: SVE  Plan:  Continue routine obstetrical care  Next visit: prefers in person    Reviewed: Term labor symptoms and general obstetric precautions including but not limited to vaginal bleeding, contractions, leaking of fluid and fetal movement were reviewed in detail with the patient.  All questions were answered. Has home bp cuff. . Check bp weekly, let us know if >140/90.   Follow-up: No follow-ups on file.  Future Appointments  Date Time Provider Department Center  01/20/2024  8:30 AM Cheral Marker, [place] [facility]  01/27/2024  8:50 AM Cheral Marker, CNM [facility]  02/03/2024  9:10 AM Cheral Marker, CNM [facility]  02/03/2024  9:10 AM [facility] NURSE [facility]    No orders of the defined types were placed in this encounter.  Jacklyn Shell DNP, CNM 01/13/2024 9:00 AM

## 2024-01-13 NOTE — Patient Instructions (Signed)

## 2024-01-19 ENCOUNTER — Inpatient Hospital Stay (HOSPITAL_COMMUNITY)
Admission: AD | Admit: 2024-01-19 | Discharge: 2024-01-21 | DRG: 807 | Disposition: A | Discharge status: Home / Self Care | Payer: Medicaid Other | Attending: Obstetrics and Gynecology | Admitting: Obstetrics and Gynecology

## 2024-01-19 ENCOUNTER — Encounter (HOSPITAL_COMMUNITY): Payer: Self-pay | Admitting: Obstetrics and Gynecology

## 2024-01-19 DIAGNOSIS — O26893 Other specified pregnancy related conditions, third trimester: Secondary | ICD-10-CM | POA: Diagnosis not present

## 2024-01-19 DIAGNOSIS — Z3A38 38 weeks gestation of pregnancy: Secondary | ICD-10-CM

## 2024-01-19 MED ORDER — SIMETHICONE 80 MG PO CHEW: 80.0000 mg | CHEWABLE_TABLET | ORAL | Status: DC | PRN

## 2024-01-19 MED ORDER — ZOLPIDEM TARTRATE 5 MG PO TABS: 5.0000 mg | ORAL_TABLET | Freq: Every evening | ORAL | Status: DC | PRN

## 2024-01-19 MED ORDER — SODIUM CHLORIDE 0.9% FLUSH: 3.0000 mL | Freq: Two times a day (BID) | INTRAVENOUS | Status: DC

## 2024-01-19 MED ORDER — PRENATAL MULTIVITAMIN CH
1.0000 | ORAL_TABLET | Freq: Every day | ORAL | Status: DC
  Administered 2024-01-20: 1 via ORAL
  Filled 2024-01-19: qty 1

## 2024-01-19 MED ORDER — ACETAMINOPHEN 325 MG PO TABS: 650.0000 mg | ORAL_TABLET | ORAL | Status: DC | PRN

## 2024-01-19 MED ORDER — FENTANYL CITRATE (PF) 100 MCG/2ML IJ SOLN
50.0000 ug | INTRAMUSCULAR | Status: DC | PRN
Start: 2024-01-19 — End: 2024-01-19

## 2024-01-19 MED ORDER — WITCH HAZEL-GLYCERIN EX PADS: 1.0000 | MEDICATED_PAD | CUTANEOUS | Status: DC | PRN

## 2024-01-19 MED ORDER — BENZOCAINE-MENTHOL 20-0.5 % EX AERO
1.0000 | INHALATION_SPRAY | CUTANEOUS | Status: DC | PRN
  Administered 2024-01-19: 1 via TOPICAL
  Filled 2024-01-19: qty 56

## 2024-01-19 MED ORDER — ONDANSETRON HCL 4 MG/2ML IJ SOLN: 4.0000 mg | INTRAMUSCULAR | Status: DC | PRN

## 2024-01-19 MED ORDER — OXYTOCIN-SODIUM CHLORIDE 30-0.9 UT/500ML-% IV SOLN: 2.5000 [IU]/h | INTRAVENOUS | Status: DC

## 2024-01-19 MED ORDER — DIPHENHYDRAMINE HCL 25 MG PO CAPS: 25.0000 mg | ORAL_CAPSULE | Freq: Four times a day (QID) | ORAL | Status: DC | PRN

## 2024-01-19 MED ORDER — OXYCODONE-ACETAMINOPHEN 5-325 MG PO TABS: 1.0000 | ORAL_TABLET | ORAL | Status: DC | PRN

## 2024-01-19 MED ORDER — ONDANSETRON HCL 4 MG PO TABS: 4.0000 mg | ORAL_TABLET | ORAL | Status: DC | PRN

## 2024-01-19 MED ORDER — DIBUCAINE (PERIANAL) 1 % EX OINT: 1.0000 | TOPICAL_OINTMENT | CUTANEOUS | Status: DC | PRN

## 2024-01-19 MED ORDER — SOD CITRATE-CITRIC ACID 500-334 MG/5ML PO SOLN: 30.0000 mL | ORAL | Status: DC | PRN

## 2024-01-19 MED ORDER — LACTATED RINGERS IV SOLN: 500.0000 mL | INTRAVENOUS | Status: DC | PRN

## 2024-01-19 MED ORDER — TETANUS-DIPHTH-ACELL PERTUSSIS 5-2.5-18.5 LF-MCG/0.5 IM SUSY: 0.5000 mL | PREFILLED_SYRINGE | Freq: Once | INTRAMUSCULAR | Status: DC

## 2024-01-19 MED ORDER — COCONUT OIL OIL: 1.0000 | TOPICAL_OIL | Status: DC | PRN

## 2024-01-19 MED ORDER — SENNOSIDES-DOCUSATE SODIUM 8.6-50 MG PO TABS
2.0000 | ORAL_TABLET | ORAL | Status: DC
  Administered 2024-01-19: 2 via ORAL
  Filled 2024-01-19 (×2): qty 2

## 2024-01-19 MED ORDER — LIDOCAINE HCL (PF) 1 % IJ SOLN: 30.0000 mL | INTRAMUSCULAR | Status: DC | PRN

## 2024-01-19 MED ORDER — SODIUM CHLORIDE 0.9% FLUSH: 3.0000 mL | INTRAVENOUS | Status: DC | PRN

## 2024-01-19 MED ORDER — OXYTOCIN 10 UNIT/ML IJ SOLN
INTRAMUSCULAR | Status: AC
  Administered 2024-01-19: 10 [IU] via INTRAMUSCULAR
  Filled 2024-01-19: qty 1

## 2024-01-19 MED ORDER — IBUPROFEN 600 MG PO TABS
600.0000 mg | ORAL_TABLET | Freq: Four times a day (QID) | ORAL | Status: DC
  Administered 2024-01-19 – 2024-01-21 (×6): 600 mg via ORAL
  Filled 2024-01-19 (×7): qty 1

## 2024-01-19 MED ORDER — LACTATED RINGERS IV SOLN: INTRAVENOUS | Status: DC

## 2024-01-19 MED ORDER — OXYCODONE-ACETAMINOPHEN 5-325 MG PO TABS: 2.0000 | ORAL_TABLET | ORAL | Status: DC | PRN

## 2024-01-19 MED ORDER — ONDANSETRON HCL 4 MG/2ML IJ SOLN: 4.0000 mg | Freq: Four times a day (QID) | INTRAMUSCULAR | Status: DC | PRN

## 2024-01-19 MED ORDER — OXYTOCIN BOLUS FROM INFUSION: 333.0000 mL | Freq: Once | INTRAVENOUS | Status: DC

## 2024-01-19 MED ORDER — SODIUM CHLORIDE 0.9 % IV SOLN: 250.0000 mL | INTRAVENOUS | Status: DC | PRN

## 2024-01-19 MED ORDER — OXYTOCIN 10 UNIT/ML IJ SOLN: 10.0000 [IU] | Freq: Once | INTRAMUSCULAR | Status: AC

## 2024-01-19 NOTE — MAU Note (Signed)
.  Catherine Park is a 28 y.o. at [redacted]w[redacted]d here in MAU reporting: --late entry note  1932 --pt ambulatory to room. Reporting painful regular contractions-assisted to bed immediate SVE found pt to br dilated to 9cm with BBOW.  Dr.Newton on unit and to room.  FHR obtained with external fetal monitor. Pt assisted with paced breathing to maintain control. Admitting MD notified., LD room 216 given for pt per LD charge-pt tranfered via bed  1942 Pain score: 10 There were no vitals filed for this visit.   FHT: 155bpm  Lab orders placed from triage: mau labor

## 2024-01-19 NOTE — H&P (Signed)
OBSTETRIC ADMISSION HISTORY AND PHYSICAL  Catherine Park is a 28 y.o. female G2P1001 with IUP at [redacted]w[redacted]d by early [place] presenting for SOL. She reports +FMs, No LOF, no VB, no blurry vision, headaches or peripheral edema, and RUQ pain.  She plans on breast feeding. She request withdrawal for birth control. She received her prenatal care at [facility]   Dating: By early [place] --->  Estimated Date of Delivery: 02/02/24  Sono:    @[redacted]w[redacted]d , CWD, normal anatomy, breech presentation, anterior placental lie, 335g, 45% EFW   Prenatal History/Complications:  Patient Active Problem List   Diagnosis Date Noted   Encounter for supervision of normal pregnancy, antepartum 07/18/2023   NURSING  PROVIDER  Office Location [facility] D[facility] by U/S at 7 wks  [facility] Model Traditional Anatomy U/S Normal female '[name]'  Initiated care at  [employer]  English               LAB RESULTS   Support Person   Genetics NIPS: LR female AFP:       NT/IT (FT only) neg      Carrier Screen Horizon: 05/10/21 neg  Rhogam  B/Positive/-- (07/30 1202) A1C/GTT Early: 5.4 Third trimester: wnl  Flu Vaccine Declined       TDaP Vaccine  01/13/24  Blood Type B/Positive/-- (07/30 1202)  Covid Vaccine   Antibody Negative (11/19 0817)  RSV 01/13/24  Rubella 2.49 (07/30 1202)  Feeding Plan breast RPR Non Reactive (11/19 0817)  Contraception W/drawal HBsAg Negative (07/30 1202)  Circumcision N/a HIV Non Reactive (11/19 0817)  Pediatrician  [place] HCVAb Non Reactive (07/30 1202)  Prenatal Classes discussed          Pap       Diagnosis  Date Value Ref Range Status  06/07/2021     Final    - Negative for intraepithelial lesion or malignancy (NILM)    BTLConsent   GC/CT Initial:  -/- 36wks:  -/-  VBAC  Consent   GBS Negative/-- (01/13 0216)neg For PCN allergy, check sensitivities            DME Rx [ ]  BP cuff [ ]  Weight Scale Waterbirth  x] Class [ x] CNM visit  PHQ9 & GAD7 [  ] new OB [  ] 28  weeks  [  ] 36 weeks Induction  [ ]  Orders Entered [ ] Foley Y/N     Past Medical History: Past Medical History:  Diagnosis Date   Hyperlipidemia    Medical history non-contributory     Past Surgical History: Past Surgical History:  Procedure Laterality Date   WISDOM TOOTH EXTRACTION      Obstetrical History: OB History     Gravida  2   Para  1   Term  1   Preterm  0   AB  0   Living  1      SAB  0   IAB  0   Ectopic  0   Multiple  0   Live Births  1           Social History Social History   Socioeconomic History   Marital status: Married    Spouse name: Not on file   Number of children: Not on file   Years of education: Not on file   Highest education level: Not on file  Occupational History   Not  on file  Tobacco Use   Smoking status: Never   Smokeless tobacco: Never  Vaping Use   Vaping status: Never Used  Substance and Sexual Activity   Alcohol use: No   Drug use: No   Sexual activity: Yes    Birth control/protection: None  Other Topics Concern   Not on file  Social History Narrative   Not on file   Social Drivers of Health   Financial Resource Strain: Low Risk  (07/23/2023)   Overall Financial Resource Strain (CARDIA)    Difficulty of Paying Living Expenses: Not hard at all  Food Insecurity: No Food Insecurity (07/23/2023)   Hunger Vital Sign    Worried About Running Out of Food in the Last Year: Never true    Ran Out of Food in the Last Year: Never true  Transportation Needs: No Transportation Needs (07/23/2023)   PRAPARE - Administrator, Civil Service (Medical): No    Lack of Transportation (Non-Medical): No  Physical Activity: Insufficiently Active (07/23/2023)   Exercise Vital Sign    Days of Exercise per Week: 3 days    Minutes of Exercise per Session: 40 min  Stress: No Stress Concern Present (07/23/2023)   [employer] of Occupational Health - Occupational Stress Questionnaire    Feeling of Stress :  Not at all  Social Connections: Moderately Isolated (07/23/2023)   Social Connection and Isolation Panel [NHANES]    Frequency of Communication with Friends and Family: More than three times a week    Frequency of Social Gatherings with Friends and Family: Three times a week    Attends Religious Services: Never    Active Member of Clubs or Organizations: No    Attends Banker Meetings: Never    Marital Status: Married    Family History: Family History  Problem Relation Age of Onset   Hyperlipidemia Mother     Allergies: No Known Allergies  Medications Prior to Admission  Medication Sig Dispense Refill Last Dose/Taking   aspirin-acetaminophen-caffeine (EXCEDRIN MIGRAINE) 250-250-65 MG tablet Take by mouth every 6 (six) hours as needed for headache. (Patient not taking: Reported on 01/13/2024)      Prenatal Vit-Fe Fumarate-FA (MULTIVITAMIN-PRENATAL) 27-0.8 MG TABS tablet Take 1 tablet by mouth daily at 12 noon.        Review of Systems   All systems reviewed and negative except as stated in HPI  Blood pressure 131/86, pulse (!) 126, temperature 98.2 F (36.8 C), temperature source Oral, resp. rate 20, last menstrual period 04/14/2023. General appearance: alert, cooperative, and appears stated age Lungs: clear to auscultation bilaterally Heart: regular rate and rhythm Abdomen: soft, non-tender; bowel sounds normal Pelvic: normal female genitalia  Extremities: Homans sign is negative, no sign of DVT Presentation: cephalic Fetal monitoringBaseline: 150 bpm, Variability: Good {> 6 bpm), Accelerations: Reactive, and Decelerations: Absent Uterine activity q2-86mins Dilation: 10 Effacement (%): 90 Station: Plus 1 Exam by:: Quin Hoop SNM   Prenatal labs: ABO, Rh: B/Positive/-- (07/30 1202) Antibody: Negative (11/19 0817) Rubella: 2.49 (07/30 1202) RPR: Non Reactive (11/19 0817)  HBsAg: Negative (07/30 1202)  HIV: Non Reactive (11/19 0817)  GBS: Negative/--  (01/13 0216)    Lab Results  Component Value Date   GBS Negative 01/06/2024   GTT normal  Genetic screening  LR female, Horizon neg  Anatomy US normal   Immunization History  Administered Date(s) Administered   DTaP 10/27/1996, 11/16/1996, 01/12/1997, 03/09/1997, 04/26/1998, 08/18/2001   HIB (PRP-OMP) 11/16/1996, 01/12/1997, 03/09/1997, 03/19/1997  HIB (PRP-T) 04/26/1998   HPV 9-valent 08/16/2015   HPV Quadrivalent 07/29/2012, 05/11/2015   Hepatitis A 07/28/2009   Hepatitis A, Ped/Adol-2 Dose 05/11/2015   Hepatitis B 11/06/1996, 11/16/1996, 06/28/1997, 06/07/1998   IPV 11/16/1996, 06/28/1997, 08/23/1997, 04/26/1998, 05/07/1998, 06/07/1998, 08/18/2001   MMR 08/23/1997, 04/26/1998, 08/18/2001   Meningococcal Conjugate 07/28/2009, 05/11/2015   Rsv, Bivalent, Protein Subunit Rsvpref,pf Verdis Frederickson) 12/23/2023   Td 08/05/2007   Tdap 08/05/2007, 03/27/2020, 08/17/2021, 11/12/2023   Varicella 08/23/1997, 08/18/2001    Prenatal Transfer Tool  Maternal Diabetes: No Genetic Screening: Normal Maternal Ultrasounds/Referrals: Normal Fetal Ultrasounds or other Referrals:  None Maternal Substance Abuse:  No Significant Maternal Medications:  None Significant Maternal Lab Results: Group B Strep negative Number of Prenatal Visits:greater than 3 verified prenatal visits Maternal Vaccinations:RSV: Given during pregnancy <14 days ago and TDap Other Comments:  None   No results found for this or any previous visit (from the past 24 hours).  Patient Active Problem List   Diagnosis Date Noted   Encounter for supervision of normal pregnancy, antepartum 07/18/2023    Assessment/Plan:  Francisca Langenderfer is a 28 y.o. G2P1001 at [redacted]w[redacted]d here for SOL  #Labor:expectant management #Pain: Per patient request #FWB: Cat 1 #GBS status:  negative #Feeding: Breastmilk  #Reproductive Life planning: None  Hessie Dibble, MD  01/19/2024, 8:37 PM

## 2024-01-19 NOTE — Discharge Summary (Signed)
Postpartum Discharge Summary  Date of Service updated***     Patient Name: Catherine Park DOB: September 29, 1996 MRN: [id number]  Date of admission: 01/19/2024 Delivery date:01/19/2024 Delivering provider: Elige Ko Date of discharge: 01/19/2024  Admitting diagnosis: Normal labor [O80, Z37.9] Intrauterine pregnancy: [redacted]w[redacted]d     Secondary diagnosis:  Active Problems:   * No active hospital problems. *  Additional problems: ***    Discharge diagnosis: Term Pregnancy Delivered                                              Post partum procedures:{Postpartum procedures:23558} Augmentation: N/A Complications: None  Hospital course: Onset of Labor With Vaginal Delivery      28 y.o. yo G2P1001 at [redacted]w[redacted]d was admitted in Active Labor on 01/19/2024. Labor course was uncomplicated.   Membrane Rupture Time/Date: 7:53 PM,01/19/2024  Delivery Method:Vaginal, Spontaneous Operative Delivery:N/A Episiotomy: None Lacerations:  None Patient had a postpartum course complicated by ***.  She is ambulating, tolerating a regular diet, passing flatus, and urinating well. Patient is discharged home in stable condition on 01/19/24.  Newborn Data: Birth date:01/19/2024 Birth time:7:58 PM Gender:Female Living status:Living Apgars:9 ,9  Weight:   Magnesium Sulfate received: {Mag received:30440022} BMZ received: No Rhophylac:N/A MMR:N/A T-DaP:Given prenatally Flu: declined RSV Vaccine received: Yes Transfusion:{Transfusion received:30440034}  Immunizations received: Immunization History  Administered Date(s) Administered   DTaP 10/27/1996, 11/16/1996, 01/12/1997, 03/09/1997, 04/26/1998, 08/18/2001   HIB (PRP-OMP) 11/16/1996, 01/12/1997, 03/09/1997, 03/19/1997   HIB (PRP-T) 04/26/1998   HPV 9-valent 08/16/2015   HPV Quadrivalent 07/29/2012, 05/11/2015   Hepatitis A 07/28/2009   Hepatitis A, Ped/Adol-2 Dose 05/11/2015   Hepatitis B 07-18-96, 11/16/1996, 06/28/1997, 06/07/1998   IPV  11/16/1996, 06/28/1997, 08/23/1997, 04/26/1998, 05/07/1998, 06/07/1998, 08/18/2001   MMR 08/23/1997, 04/26/1998, 08/18/2001   Meningococcal Conjugate 07/28/2009, 05/11/2015   Rsv, Bivalent, Protein Subunit Rsvpref,pf Verdis Frederickson) 12/23/2023   Td 08/05/2007   Tdap 08/05/2007, 03/27/2020, 08/17/2021, 11/12/2023   Varicella 08/23/1997, 08/18/2001    Physical exam  Vitals:   01/19/24 1946 01/19/24 2007  BP: 127/80 131/86  Pulse: 78 (!) 126  Resp: 20   Temp: 98.2 F (36.8 C)   TempSrc: Oral    General: {Exam; general:21111117} Lochia: {Desc; appropriate/inappropriate:30686::"appropriate"} Uterine Fundus: {Desc; firm/soft:30687} Incision: {Exam; incision:21111123} DVT Evaluation: {Exam; WUJ:8119147} Labs: Lab Results  Component Value Date   WBC 11.9 (H) 11/12/2023   HGB 12.4 11/12/2023   HCT 38.1 11/12/2023   MCV 89 11/12/2023   PLT 272 11/12/2023      Latest Ref Rng & Units 06/16/2021    5:04 AM  CMP  Glucose 70 - 99 mg/dL 829   BUN 6 - 20 mg/dL 6   Creatinine 5.62 - 1.30 mg/dL 8.65   Sodium 784 - 696 mmol/L 135   Potassium 3.5 - 5.1 mmol/L 3.5   Chloride 98 - 111 mmol/L 104   CO2 22 - 32 mmol/L 23   Calcium 8.9 - 10.3 mg/dL 9.0    Edinburgh Score:    12/12/2021    2:15 PM  Edinburgh Postnatal Depression Scale Screening Tool  I have been able to laugh and see the funny side of things. 0  I have looked forward with enjoyment to things. 0  I have blamed myself unnecessarily when things went wrong. 1  I have been anxious or worried for no good reason. 1  I have felt  scared or panicky for no good reason. 0  Things have been getting on top of me. 1  I have been so unhappy that I have had difficulty sleeping. 0  I have felt sad or miserable. 0  I have been so unhappy that I have been crying. 0  The thought of harming myself has occurred to me. 0  Edinburgh Postnatal Depression Scale Total 3   No data recorded  After visit meds:  Allergies as of 01/19/2024   No Known  Allergies   Med Rec must be completed prior to using this [facility]***        Discharge home in stable condition Infant Feeding: {Baby feeding:23562} Infant Disposition:{CHL IP OB HOME WITH NWGNFA:21308} Discharge instruction: per After Visit Summary and Postpartum booklet. Activity: Advance as tolerated. Pelvic rest for 6 weeks.  Diet: {OB MVHQ:46962952} Future Appointments: Future Appointments  Date Time Provider Department Center  01/20/2024  8:30 AM Cheral Marker, CNM [facility]  01/27/2024  8:50 AM Cheral Marker, CNM [facility]  02/03/2024  9:10 AM Cheral Marker, CNM [facility]  02/03/2024  9:10 AM [facility] NURSE [facility]   Follow up Visit: Message to FT 1/26  Please schedule this patient for a In person postpartum visit in 4 weeks with the following provider: Any provider. Additional Postpartum F/U: n/a   Low risk pregnancy complicated by:  n/a Delivery mode:  Vaginal, Spontaneous Anticipated Birth Control:   None (withdrawal)   01/19/2024 Hessie Dibble, MD

## 2024-01-20 ENCOUNTER — Encounter: Payer: Medicaid Other | Admitting: Women's Health

## 2024-01-20 LAB — CBC
HCT: 33.7 % — ABNORMAL LOW (ref 36.0–46.0)
Hemoglobin: 11.3 g/dL — ABNORMAL LOW (ref 12.0–15.0)
MCH: 27.5 pg (ref 26.0–34.0)
MCHC: 33.5 g/dL (ref 30.0–36.0)
MCV: 82 fL (ref 80.0–100.0)
Platelets: 229 10*3/uL (ref 150–400)
RBC: 4.11 MIL/uL (ref 3.87–5.11)
RDW: 13.2 % (ref 11.5–15.5)
WBC: 14.9 10*3/uL — ABNORMAL HIGH (ref 4.0–10.5)
nRBC: 0 % (ref 0.0–0.2)

## 2024-01-20 LAB — RPR: RPR Ser Ql: NONREACTIVE

## 2024-01-20 NOTE — Progress Notes (Signed)
Patient ID: Catherine Park, female   DOB: 1996/02/06, 28 y.o.   MRN: [id number] POSTPARTUM PROGRESS NOTE  Post Partum Day 1  Subjective:  Catherine Park is a 28 y.o. Q6V7846 s/p SVD at [redacted]w[redacted]d.  No acute events overnight.  Pt denies problems with ambulating, voiding or po intake.  She denies nausea or vomiting.  Pain is well controlled.  She has had flatus. She has not had bowel movement.  Lochia Small.   Objective: Blood pressure 114/72, pulse (!) 57, temperature 98.3 F (36.8 C), temperature source Oral, resp. rate 19, height 5\' 6"  (1.676 m), weight 99.3 kg, last menstrual period 10/26/2023, SpO2 100%.  Physical Exam:  General: alert, cooperative and no distress Chest: no respiratory distress Heart:regular rate, distal pulses intact Abdomen: soft, nontender,  Uterine Fundus: firm, appropriately tender DVT Evaluation: No calf swelling or tenderness Extremities: no edema Skin: warm, dry  Recent Labs    11/10/23 0908 11/11/23 0856  HGB 13.3 14.0  HCT 40.4 42.2    Assessment/Plan: Catherine Park is a 28 y.o. N6E9528 s/p SVD at [redacted]w[redacted]d  PPD#1 - Doing well Contraception: none Feeding: breast Dispo: Plan for discharge tomorrow.   LOS: 0 days   Margie Billet, MD 01/20/2024 7:52 AM

## 2024-01-20 NOTE — Lactation Note (Signed)
This note was copied from a baby's chart. Lactation Consultation Note  Patient Name: Catherine Park ZOXWR'U Date: 01/20/2024 Age:28 hours Reason for consult: Follow-up assessment;Early term 37-38.6wks  P2, 38 wks, @ 17 hrs of life. Encouraged mom to feed every 3 hrs. Discussed progression at breast- sleepy day 1, more feeding cues day 2, cluster feeding overnight. Encouraged starting with hand expression and holding breast compression throughout feed to keep baby working.  Discussed/demonstrated working on [employer] manager today and getting baby deeper onto breast. Infant working well on breast, encouraged EBM or coconut oil post feed for nipple health. Mom continues to work after [facility] exit. Discussed position changes and pillows for positioning. Mom has a SPECTRA @ home.  Maternal Data Has patient been taught Hand Expression?: Yes Does the patient have breastfeeding experience prior to this delivery?: Yes How long did the patient breastfeed?: Mom breastfed for first 3 months, then changed to pumping- baby had tongue tie, and came back to breastfeeding @ 8 months and fed until 13 months.  Feeding Mother's Current Feeding Choice: Breast Milk  LATCH Score Latch: Grasps breast easily, tongue down, lips flanged, rhythmical sucking.  Audible Swallowing: Spontaneous and intermittent  Type of Nipple: Everted at rest and after stimulation  Comfort (Breast/Nipple): Soft / non-tender  Hold (Positioning): Assistance needed to correctly position infant at breast and maintain latch.  LATCH Score: 9   Lactation Tools Discussed/Used    Interventions Interventions: Breast feeding basics reviewed;Assisted with latch;Hand express;Breast compression;Support pillows;Expressed milk;Coconut oil;Education  Discharge Pump: Personal;DEBP (Spectra)  Consult Status Consult Status: Follow-up Date: 01/21/24 Follow-up type: In-patient    [facility] 01/20/2024, 1:39 PM

## 2024-01-20 NOTE — Lactation Note (Signed)
This note was copied from a baby's chart. Lactation Consultation Note  Patient Name: Catherine Park ZOXWR'U Date: 01/20/2024 Age:28 hours Reason for consult: Mother's request;Early term 20-38.6wks Mom called out d/t baby hadn't BF since 1300. Baby would get on the breast then fight against the breast and scream. Mom was holding baby w/outfit on and opened doing STS. Praised mom. LC changed diaper and calmed crying baby down then placed at the breast in football hold. Baby started crying some then suckling. Sounded nasal congested. Slightly held down breast tissue away from nose so baby could breathe. Baby fussed then suckled a few times then BF great. Noted good swallows at first. Laid mom back some and that seemed to calm baby. Laid mom back more when heard gulping. Asked mom if she felt let down mom stated no. Stated to mom this may be a long feeding since she hasn't fed in a long time. Probably cluster feeding will start tonight. Encouraged mom to lay back some during feeding if needed. Praised mom for good feeding. Baby still actively feeding on the breast. Hearing milk transfer. Mom happy. Encouraged to call if further questions or concerns. A lot of teaching done during consult and answered questions.  Maternal Data    Feeding    LATCH Score Latch: Grasps breast easily, tongue down, lips flanged, rhythmical sucking.  Audible Swallowing: Spontaneous and intermittent  Type of Nipple: Everted at rest and after stimulation  Comfort (Breast/Nipple): Filling, red/small blisters or bruises, mild/mod discomfort  Hold (Positioning): Assistance needed to correctly position infant at breast and maintain latch.  LATCH Score: 8   Lactation Tools Discussed/Used    Interventions Interventions: Breast feeding basics reviewed;Assisted with latch;Skin to skin;Breast compression;Adjust position;Support pillows;Position options;Education  Discharge    Consult Status Consult  Status: Follow-up Date: 01/21/24 Follow-up type: In-patient    Charyl Dancer 01/20/2024, 8:10 PM

## 2024-01-20 NOTE — Lactation Note (Signed)
This note was copied from a baby's chart. Lactation Consultation Note  Patient Name: Catherine Park [id number] Date: 01/20/2024 Age:28 hours Reason for consult: Initial assessment;Early term 37-38.6wks Late entry: 2300 Mom eating supper. Mom stated baby BF great after delivery and she is very happy w/the feeding. Mom had challenges w/her 1st child now 2 w/latching. Mom stated daughter has a tongue tie and wishes she got it fixed but Dr. Catalina Pizza her it wasn't bad enough but now daughter has speech impediment and tongue has heart shape to it. Mom ended up pumping a lot d/t daughter wasn't emptying breast well. Encouraged mom to call for Lactation for next feeding for LC to see baby feed. Mom encouraged to feed baby 8-12 times/24 hours and with feeding cues.  Maternal Data Does the patient have breastfeeding experience prior to this delivery?: Yes How long did the patient breastfeed?: 1 yr  Feeding    LATCH Score                    Lactation Tools Discussed/Used    Interventions    Discharge    Consult Status Consult Status: Follow-up Date: 01/20/24 Follow-up type: In-patient    Charyl Dancer 01/20/2024, 12:24 AM

## 2024-01-21 LAB — BIRTH TISSUE RECOVERY COLLECTION (PLACENTA DONATION)

## 2024-01-21 MED ORDER — IBUPROFEN 600 MG PO TABS: 600.0000 mg | ORAL_TABLET | Freq: Four times a day (QID) | ORAL | 0 refills | Status: DC

## 2024-01-21 MED ORDER — SENNOSIDES-DOCUSATE SODIUM 8.6-50 MG PO TABS: 2.0000 | ORAL_TABLET | Freq: Two times a day (BID) | ORAL | 0 refills | Status: DC | PRN

## 2024-01-21 NOTE — Lactation Note (Signed)
This note was copied from a baby's chart. Lactation Consultation Note  Patient Name: Catherine Park Date: 01/21/2024 Age:28 hours Reason for consult: Follow-up assessment;Maternal discharge;Early term 70-38.6wks  P2 mom of 38 hour old ETI consulted for discharge education. Mom reports breastfeeding has been going well and this is a great experience for her as her first child had challenges with breastfeeding due to tongue and lip tie (not revised and now experiences speech impediment). Mom denies any discomfort with latching. LC reviewed breastfeeding basics including cluster feeding, diaper count, breast care for engorgement, transitional milk and lactation services after discharge. Discussed continuing to follow infant's cues and to reach our for support as needed.   Maternal Data    Feeding Mother's Current Feeding Choice: Breast Milk  LATCH Score   Infant asleep Lactation Tools Discussed/Used    Interventions Interventions: Breast feeding basics reviewed;Education;LC Services brochure (Tongue Tie Resource Handout (Previous experience/may plan to take toddler and infant for assessment))  Discharge Discharge Education: Engorgement and breast care;Warning signs for feeding baby;Outpatient recommendation  Consult Status Consult Status: Complete Date: 01/21/24    Su Grand 01/21/2024, 8:32 AM

## 2024-01-25 DIAGNOSIS — Z419 Encounter for procedure for purposes other than remedying health state, unspecified: Secondary | ICD-10-CM | POA: Diagnosis not present

## 2024-01-27 ENCOUNTER — Encounter: Payer: Medicaid Other | Admitting: Women's Health

## 2024-01-27 ENCOUNTER — Telehealth (HOSPITAL_COMMUNITY): Payer: Self-pay | Admitting: *Deleted

## 2024-01-27 NOTE — Telephone Encounter (Signed)
01/27/2024  Name: Catherine Park MRN: [id number] DOB: 22-Sep-1996  Reason for Call:  Transition of Care Hospital Discharge Call  Contact Status: Patient Contact Status: Message  Language assistant needed:          Follow-Up Questions:    Inocente Salles Postnatal Depression Scale:  In the Past 7 Days:    PHQ2-9 Depression Scale:     Discharge Follow-up:    Post-discharge interventions: NA  Salena Saner, RN 01/27/2024 12:22

## 2024-02-03 ENCOUNTER — Encounter: Payer: Medicaid Other | Admitting: Women's Health

## 2024-02-03 ENCOUNTER — Other Ambulatory Visit: Payer: Medicaid Other

## 2024-02-22 DIAGNOSIS — Z419 Encounter for procedure for purposes other than remedying health state, unspecified: Secondary | ICD-10-CM | POA: Diagnosis not present

## 2024-02-24 ENCOUNTER — Ambulatory Visit: Payer: Medicaid Other | Admitting: Women's Health

## 2024-02-24 ENCOUNTER — Encounter: Payer: Self-pay | Admitting: Women's Health

## 2024-02-24 ENCOUNTER — Other Ambulatory Visit (HOSPITAL_COMMUNITY)
Admission: RE | Admit: 2024-02-24 | Discharge: 2024-02-24 | Disposition: A | Discharge status: Home / Self Care | Source: Ambulatory Visit | Attending: Women's Health | Admitting: Women's Health

## 2024-02-24 DIAGNOSIS — Z30018 Encounter for initial prescription of other contraceptives: Secondary | ICD-10-CM | POA: Diagnosis not present

## 2024-02-24 DIAGNOSIS — Z124 Encounter for screening for malignant neoplasm of cervix: Secondary | ICD-10-CM | POA: Diagnosis not present

## 2024-02-24 MED ORDER — PHEXXI 1.8-1-0.4 % VA GEL: 1.0000 | Freq: Once | VAGINAL | 11 refills | Status: AC

## 2024-02-24 NOTE — Progress Notes (Signed)
 POSTPARTUM VISIT Patient name: Catherine Park MRN [id number]  Date of birth: 09/01/1996 Chief Complaint:   Postpartum Care  History of Present Illness:   Catherine Park is a 28 y.o. G69P2002 Hispanic female being seen today for a postpartum visit. She is 5 weeks postpartum following a spontaneous vaginal delivery at 38.0 gestational weeks. IOL: no, for n/a. Anesthesia: none.  Laceration: none.  Complications: none. Inpatient contraception: no.   Pregnancy uncomplicated. Tobacco use: no. Substance use disorder: no. Last pap smear: 06/07/21 and results were  neg . Next pap smear due: now Patient's last menstrual period was 04/14/2023.  Postpartum course has been uncomplicated. Bleeding none. Bowel function is normal. Bladder function is normal. Urinary incontinence? no, fecal incontinence? no Patient is not sexually active. Last sexual activity: prior to birth of baby. Desired contraception: condoms, offered Phexxi-wants to try . Patient does want a pregnancy in the future.  Desired family size is 3 children.   Upstream - 02/24/24 1046       Pregnancy Intention Screening   Does the patient want to become pregnant in the next year? No    Does the patient's partner want to become pregnant in the next year? No    Would the patient like to discuss contraceptive options today? No      Contraception Wrap Up   Current Method Female Condom    End Method Female Condom    Contraception Counseling Provided No            The pregnancy intention screening data noted above was reviewed. Potential methods of contraception were discussed. The patient elected to proceed with Female Condom.  Edinburgh Postpartum Depression Screening: negative  Edinburgh Postnatal Depression Scale - 02/24/24 1035       Edinburgh Postnatal Depression Scale:  In the Past 7 Days   I have been able to laugh and see the funny side of things. 0    I have looked forward with enjoyment to things. 0    I have  blamed myself unnecessarily when things went wrong. 1    I have been anxious or worried for no good reason. 1    I have felt scared or panicky for no good reason. 0    Things have been getting on top of me. 2    I have been so unhappy that I have had difficulty sleeping. 1    I have felt sad or miserable. 0    I have been so unhappy that I have been crying. 0    The thought of harming myself has occurred to me. 0    Edinburgh Postnatal Depression Scale Total 5                11/12/2023    8:49 AM 07/23/2023   10:42 AM 08/17/2021    9:08 AM 05/10/2021    3:16 PM  GAD 7 : Generalized Anxiety Score  Nervous, Anxious, on Edge 0 0 0 1  Control/stop worrying 0 0 0 1  Worry too much - different things 0 0 0 1  Trouble relaxing 1 0 0 0  Restless 1 0 0 1  Easily annoyed or irritable 1 0 0 1  Afraid - awful might happen 0 0 0 0  Total GAD 7 Score 3 0 0 5     Baby's course has been uncomplicated. Baby is feeding by breast: milk supply adequate. Infant has a pediatrician/family doctor? Yes.  Childcare strategy if returning to work/school: n/a-stay at  home mom.  Pt has material needs met for her and baby: Yes.   Review of Systems:   Pertinent items are noted in HPI Denies Abnormal vaginal discharge w/ itching/odor/irritation, headaches, visual changes, shortness of breath, chest pain, abdominal pain, severe nausea/vomiting, or problems with urination or bowel movements. Pertinent History Reviewed:  Reviewed past medical,surgical, obstetrical and family history.  Reviewed problem list, medications and allergies. OB History  Gravida Para Term Preterm AB Living  2 2 2  0 0 2  SAB IAB Ectopic Multiple Live Births  0 0 0 0 2    # Outcome Date GA Lbr Len/2nd Weight Sex Type Anes PTL Lv  2 Term 01/19/24 [redacted]w[redacted]d 00:01 / 00:04 7 lb 3 oz (3.26 kg) F Vag-Spont None  LIV  1 Term 11/07/21 [redacted]w[redacted]d 07:31 / 00:16 7 lb 7.9 oz (3.399 kg) F Vag-Spont Other  LIV   Physical Assessment:   Vitals:    02/24/24 1025  BP: 115/76  Pulse: 80  Weight: 189 lb (85.7 kg)  Height: 5\' 2"  (1.575 m)  Body mass index is 34.57 kg/m.       Physical Examination:   General appearance: alert, well appearing, and in no distress  Mental status: alert, oriented to person, place, and time  Skin: warm & dry   Cardiovascular: normal heart rate noted   Respiratory: normal respiratory effort, no distress   Breasts: deferred, no complaints   Abdomen: soft, non-tender   Pelvic: VULVA: normal appearing vulva with no masses, tenderness or lesions, VAGINA: normal appearing vagina with normal color and discharge, no lesions, CERVIX: normal appearing cervix without discharge or lesions. Thin prep pap obtained: Yes  Rectal: not examined  Extremities: Edema: none   Chaperone: Peggy Dones       No results found for this or any previous visit (from the past 24 hours).  Assessment & Plan:  1) Postpartum exam 2) 5 wks s/p spontaneous vaginal delivery 3) breast feeding 4) Depression screening 5) Contraception management: rx Phexxi to [facility]  Essential components of care per ACOG recommendations:  1.  Mood and well being:  If positive depression screen, discussed and plan developed.  If using tobacco we discussed reduction/cessation and risk of relapse If current substance abuse, we discussed and referral to local resources was offered.   2. Infant care and feeding:  If breastfeeding, discussed returning to work, pumping, breastfeeding-associated pain, guidance regarding return to fertility while lactating if not using another method. If needed, patient was provided with a letter to be allowed to pump q 2-3hrs to support lactation in a private location with access to a refrigerator to store breastmilk.   Recommended that all caregivers be immunized for flu, pertussis and other preventable communicable diseases If pt does not have material needs met for her/baby, referred to local resources for help obtaining  these.  3. Sexuality, contraception and birth spacing Provided guidance regarding sexuality, management of dyspareunia, and resumption of intercourse Discussed avoiding interpregnancy interval <66mths and recommended birth spacing of 18 months  4. Sleep and fatigue Discussed coping options for fatigue and sleep disruption Encouraged family/partner/community support of 4 hrs of uninterrupted sleep to help with mood and fatigue  5. Physical recovery  If pt had a C/S, assessed incisional pain and providing guidance on normal vs prolonged recovery If pt had a laceration, perineal healing and pain reviewed.  If urinary or fecal incontinence, discussed management and referred to PT or uro/gyn if indicated  Patient is safe to resume physical  activity. Discussed attainment of healthy weight.  6.  Chronic disease management Discussed pregnancy complications if any, and their implications for future childbearing and long-term maternal health. Review recommendations for prevention of recurrent pregnancy complications, such as 17 hydroxyprogesterone caproate to reduce risk for recurrent PTB not applicable, or aspirin to reduce risk of preeclampsia not applicable. Pt had GDM: no. If yes, 2hr GTT scheduled: not applicable. Reviewed medications and non-pregnant dosing including consideration of whether pt is breastfeeding using a reliable resource such as LactMed: yes Referred for f/u w/ PCP or subspecialist providers as indicated: not applicable  7. Health maintenance Mammogram at 28yo or earlier if indicated Pap smears as indicated  Meds:  Meds ordered this encounter  Medications   Lactic Ac-Citric Ac-Pot Bitart (PHEXXI) 1.8-1-0.4 % GEL    Sig: Place 1 Applicatorful vaginally once for 1 dose. Up to 1 hour before sex    Dispense:  180 g    Refill:  11    Follow-up: Return in about 1 year (around 02/23/2025) for Physical.   No orders of the defined types were placed in this  encounter.   Cheral Marker CNM, [facility] 02/24/2024 11:06 AM

## 2024-02-25 ENCOUNTER — Other Ambulatory Visit: Payer: Self-pay

## 2024-02-25 ENCOUNTER — Observation Stay (HOSPITAL_COMMUNITY)
Admission: EM | Admit: 2024-02-25 | Discharge: 2024-02-26 | Disposition: A | Discharge status: Home / Self Care | Attending: General Surgery | Admitting: General Surgery

## 2024-02-25 ENCOUNTER — Emergency Department (HOSPITAL_COMMUNITY)

## 2024-02-25 ENCOUNTER — Encounter (HOSPITAL_COMMUNITY)
Admission: EM | Disposition: A | Discharge status: Home / Self Care | Payer: Self-pay | Source: Home / Self Care | Attending: Emergency Medicine

## 2024-02-25 ENCOUNTER — Encounter (HOSPITAL_COMMUNITY): Payer: Self-pay

## 2024-02-25 ENCOUNTER — Ambulatory Visit
Admission: EM | Admit: 2024-02-25 | Discharge: 2024-02-25 | Disposition: A | Discharge status: Home / Self Care | Attending: Family Medicine | Admitting: Family Medicine

## 2024-02-25 ENCOUNTER — Emergency Department (HOSPITAL_COMMUNITY): Admitting: Anesthesiology

## 2024-02-25 ENCOUNTER — Emergency Department (HOSPITAL_BASED_OUTPATIENT_CLINIC_OR_DEPARTMENT_OTHER): Admitting: Anesthesiology

## 2024-02-25 DIAGNOSIS — K388 Other specified diseases of appendix: Secondary | ICD-10-CM | POA: Diagnosis not present

## 2024-02-25 DIAGNOSIS — K3580 Unspecified acute appendicitis: Secondary | ICD-10-CM | POA: Diagnosis not present

## 2024-02-25 DIAGNOSIS — R1031 Right lower quadrant pain: Secondary | ICD-10-CM

## 2024-02-25 DIAGNOSIS — R112 Nausea with vomiting, unspecified: Secondary | ICD-10-CM

## 2024-02-25 DIAGNOSIS — K353 Acute appendicitis with localized peritonitis, without perforation or gangrene: Secondary | ICD-10-CM | POA: Diagnosis not present

## 2024-02-25 HISTORY — PX: LAPAROSCOPIC APPENDECTOMY: SHX408

## 2024-02-25 LAB — CBC
HCT: 42.1 % (ref 36.0–46.0)
Hemoglobin: 13.5 g/dL (ref 12.0–15.0)
MCH: 26.1 pg (ref 26.0–34.0)
MCHC: 32.1 g/dL (ref 30.0–36.0)
MCV: 81.4 fL (ref 80.0–100.0)
Platelets: 316 10*3/uL (ref 150–400)
RBC: 5.17 MIL/uL — ABNORMAL HIGH (ref 3.87–5.11)
RDW: 13.8 % (ref 11.5–15.5)
WBC: 15.6 10*3/uL — ABNORMAL HIGH (ref 4.0–10.5)
nRBC: 0 % (ref 0.0–0.2)

## 2024-02-25 LAB — POCT URINALYSIS DIP (MANUAL ENTRY)
Bilirubin, UA: NEGATIVE
Blood, UA: NEGATIVE
Glucose, UA: NEGATIVE mg/dL
Ketones, POC UA: NEGATIVE mg/dL
Leukocytes, UA: NEGATIVE
Nitrite, UA: NEGATIVE
Protein Ur, POC: NEGATIVE mg/dL
Spec Grav, UA: 1.02 (ref 1.010–1.025)
Urobilinogen, UA: 0.2 U/dL
pH, UA: 7 (ref 5.0–8.0)

## 2024-02-25 LAB — COMPREHENSIVE METABOLIC PANEL
ALT: 16 U/L (ref 0–44)
AST: 20 U/L (ref 15–41)
Albumin: 4.1 g/dL (ref 3.5–5.0)
Alkaline Phosphatase: 98 U/L (ref 38–126)
Anion gap: 15 (ref 5–15)
BUN: 9 mg/dL (ref 6–20)
CO2: 24 mmol/L (ref 22–32)
Calcium: 9.8 mg/dL (ref 8.9–10.3)
Chloride: 100 mmol/L (ref 98–111)
Creatinine, Ser: 0.7 mg/dL (ref 0.44–1.00)
GFR, Estimated: >60 mL/min (ref >60)
Glucose, Bld: 107 mg/dL — ABNORMAL HIGH (ref 70–99)
Potassium: 4.1 mmol/L (ref 3.5–5.1)
Sodium: 139 mmol/L (ref 135–145)
Total Bilirubin: 0.6 mg/dL (ref 0.0–1.2)
Total Protein: 8 g/dL (ref 6.5–8.1)

## 2024-02-25 LAB — LIPASE, BLOOD: Lipase: 32 U/L (ref 11–51)

## 2024-02-25 LAB — HCG, SERUM, QUALITATIVE: Preg, Serum: NEGATIVE

## 2024-02-25 LAB — I-STAT CG4 LACTIC ACID, ED: Lactic Acid, Venous: 1.4 mmol/L (ref 0.5–1.9)

## 2024-02-25 SURGERY — APPENDECTOMY, LAPAROSCOPIC
Anesthesia: General | Site: Abdomen

## 2024-02-25 MED ORDER — AMISULPRIDE (ANTIEMETIC) 5 MG/2ML IV SOLN: 10.0000 mg | Freq: Once | INTRAVENOUS | Status: DC | PRN

## 2024-02-25 MED ORDER — CHLORHEXIDINE GLUCONATE CLOTH 2 % EX PADS: 6.0000 | MEDICATED_PAD | Freq: Once | CUTANEOUS | Status: DC

## 2024-02-25 MED ORDER — OXYCODONE HCL 5 MG/5ML PO SOLN: 5.0000 mg | Freq: Once | ORAL | Status: AC | PRN

## 2024-02-25 MED ORDER — FENTANYL CITRATE (PF) 250 MCG/5ML IJ SOLN
INTRAMUSCULAR | Status: DC | PRN
Start: 2024-02-25 — End: 2024-02-25
  Administered 2024-02-25: 50 ug via INTRAVENOUS
  Administered 2024-02-25: 100 ug via INTRAVENOUS

## 2024-02-25 MED ORDER — CEFAZOLIN SODIUM-DEXTROSE 2-3 GM-%(50ML) IV SOLR
INTRAVENOUS | Status: DC | PRN
  Administered 2024-02-25: 2 g via INTRAVENOUS

## 2024-02-25 MED ORDER — OXYCODONE HCL 5 MG PO TABS
10.0000 mg | ORAL_TABLET | ORAL | Status: DC | PRN
  Administered 2024-02-25: 10 mg via ORAL
  Filled 2024-02-25: qty 2

## 2024-02-25 MED ORDER — MIDAZOLAM HCL 2 MG/2ML IJ SOLN
INTRAMUSCULAR | Status: DC | PRN
  Administered 2024-02-25: 2 mg via INTRAVENOUS

## 2024-02-25 MED ORDER — LIDOCAINE 2% (20 MG/ML) 5 ML SYRINGE
INTRAMUSCULAR | Status: DC | PRN
  Administered 2024-02-25: 60 mg via INTRAVENOUS

## 2024-02-25 MED ORDER — ROCURONIUM BROMIDE 10 MG/ML (PF) SYRINGE
PREFILLED_SYRINGE | INTRAVENOUS | Status: DC | PRN
  Administered 2024-02-25: 50 mg via INTRAVENOUS

## 2024-02-25 MED ORDER — SODIUM CHLORIDE 0.9 % IV SOLN: 2.0000 g | INTRAVENOUS | Status: DC

## 2024-02-25 MED ORDER — 0.9 % SODIUM CHLORIDE (POUR BTL) OPTIME
TOPICAL | Status: DC | PRN
  Administered 2024-02-25: 1000 mL

## 2024-02-25 MED ORDER — ONDANSETRON HCL 4 MG/2ML IJ SOLN: 4.0000 mg | Freq: Four times a day (QID) | INTRAMUSCULAR | Status: DC | PRN

## 2024-02-25 MED ORDER — METHOCARBAMOL 1000 MG/10ML IJ SOLN
500.0000 mg | Freq: Four times a day (QID) | INTRAMUSCULAR | Status: DC | PRN
  Administered 2024-02-25: 500 mg via INTRAVENOUS
  Filled 2024-02-25: qty 10

## 2024-02-25 MED ORDER — OXYCODONE HCL 5 MG PO TABS
5.0000 mg | ORAL_TABLET | ORAL | Status: DC | PRN
  Administered 2024-02-26 (×2): 5 mg via ORAL
  Filled 2024-02-25 (×2): qty 1

## 2024-02-25 MED ORDER — FENTANYL CITRATE PF 50 MCG/ML IJ SOSY
50.0000 ug | PREFILLED_SYRINGE | Freq: Once | INTRAMUSCULAR | Status: AC
  Administered 2024-02-25: 50 ug via INTRAVENOUS
  Filled 2024-02-25: qty 1

## 2024-02-25 MED ORDER — PROPOFOL 10 MG/ML IV BOLUS
INTRAVENOUS | Status: DC | PRN
Start: 2024-02-25 — End: 2024-02-25
  Administered 2024-02-25: 125 ug/kg/min via INTRAVENOUS
  Administered 2024-02-25: 200 mg via INTRAVENOUS

## 2024-02-25 MED ORDER — OXYCODONE HCL 5 MG PO TABS
ORAL_TABLET | ORAL | Status: AC
  Filled 2024-02-25: qty 1

## 2024-02-25 MED ORDER — BUPIVACAINE-EPINEPHRINE (PF) 0.25% -1:200000 IJ SOLN
INTRAMUSCULAR | Status: AC
  Filled 2024-02-25: qty 30

## 2024-02-25 MED ORDER — LACTATED RINGERS IV SOLN: INTRAVENOUS | Status: DC

## 2024-02-25 MED ORDER — HYDROMORPHONE HCL 1 MG/ML IJ SOLN
0.5000 mg | INTRAMUSCULAR | Status: DC | PRN
  Administered 2024-02-26: 0.5 mg via INTRAVENOUS
  Filled 2024-02-25: qty 0.5

## 2024-02-25 MED ORDER — HEPARIN SODIUM (PORCINE) 5000 UNIT/ML IJ SOLN
5000.0000 [IU] | Freq: Three times a day (TID) | INTRAMUSCULAR | Status: DC
  Administered 2024-02-25 – 2024-02-26 (×3): 5000 [IU] via SUBCUTANEOUS
  Filled 2024-02-25 (×3): qty 1

## 2024-02-25 MED ORDER — CHLORHEXIDINE GLUCONATE CLOTH 2 % EX PADS
6.0000 | MEDICATED_PAD | Freq: Once | CUTANEOUS | Status: AC
  Administered 2024-02-25: 6 via TOPICAL

## 2024-02-25 MED ORDER — ONDANSETRON HCL 4 MG/2ML IJ SOLN
INTRAMUSCULAR | Status: DC | PRN
  Administered 2024-02-25: 4 mg via INTRAVENOUS

## 2024-02-25 MED ORDER — DEXAMETHASONE SODIUM PHOSPHATE 10 MG/ML IJ SOLN
INTRAMUSCULAR | Status: DC | PRN
  Administered 2024-02-25: 10 mg via INTRAVENOUS

## 2024-02-25 MED ORDER — IOHEXOL 350 MG/ML SOLN
75.0000 mL | Freq: Once | INTRAVENOUS | Status: AC | PRN
  Administered 2024-02-25: 75 mL via INTRAVENOUS

## 2024-02-25 MED ORDER — FENTANYL CITRATE (PF) 100 MCG/2ML IJ SOLN
25.0000 ug | INTRAMUSCULAR | Status: DC | PRN
  Administered 2024-02-25 (×2): 50 ug via INTRAVENOUS

## 2024-02-25 MED ORDER — OXYCODONE HCL 5 MG PO TABS
5.0000 mg | ORAL_TABLET | Freq: Once | ORAL | Status: AC | PRN
  Administered 2024-02-25: 5 mg via ORAL

## 2024-02-25 MED ORDER — ONDANSETRON 4 MG PO TBDP
4.0000 mg | ORAL_TABLET | Freq: Once | ORAL | Status: AC
  Administered 2024-02-25: 4 mg via ORAL

## 2024-02-25 MED ORDER — SCOPOLAMINE 1 MG/3DAYS TD PT72
1.0000 | MEDICATED_PATCH | TRANSDERMAL | Status: DC
  Administered 2024-02-25: 1.5 mg via TRANSDERMAL
  Filled 2024-02-25: qty 1

## 2024-02-25 MED ORDER — ACETAMINOPHEN 325 MG PO TABS
650.0000 mg | ORAL_TABLET | Freq: Four times a day (QID) | ORAL | Status: DC
  Administered 2024-02-25 – 2024-02-26 (×4): 650 mg via ORAL
  Filled 2024-02-25 (×4): qty 2

## 2024-02-25 MED ORDER — MORPHINE SULFATE (PF) 4 MG/ML IV SOLN: 4.0000 mg | Freq: Once | INTRAVENOUS | Status: DC

## 2024-02-25 MED ORDER — SODIUM CHLORIDE 0.9 % IR SOLN
Status: DC | PRN
  Administered 2024-02-25: 1000 mL

## 2024-02-25 MED ORDER — DOCUSATE SODIUM 100 MG PO CAPS
100.0000 mg | ORAL_CAPSULE | Freq: Two times a day (BID) | ORAL | Status: DC
  Administered 2024-02-25 – 2024-02-26 (×2): 100 mg via ORAL
  Filled 2024-02-25 (×2): qty 1

## 2024-02-25 MED ORDER — FENTANYL CITRATE (PF) 100 MCG/2ML IJ SOLN
INTRAMUSCULAR | Status: AC
  Filled 2024-02-25: qty 2

## 2024-02-25 MED ORDER — SUGAMMADEX SODIUM 200 MG/2ML IV SOLN
INTRAVENOUS | Status: DC | PRN
  Administered 2024-02-25: 400 mg via INTRAVENOUS

## 2024-02-25 MED ORDER — FENTANYL CITRATE (PF) 100 MCG/2ML IJ SOLN
50.0000 ug | Freq: Once | INTRAMUSCULAR | Status: AC
  Administered 2024-02-25: 50 ug via INTRAVENOUS

## 2024-02-25 MED ORDER — PROPOFOL 1000 MG/100ML IV EMUL
INTRAVENOUS | Status: AC
  Filled 2024-02-25: qty 100

## 2024-02-25 MED ORDER — BUPIVACAINE-EPINEPHRINE 0.25% -1:200000 IJ SOLN
INTRAMUSCULAR | Status: DC | PRN
  Administered 2024-02-25: 30 mL

## 2024-02-25 MED ORDER — OXYCODONE-ACETAMINOPHEN 5-325 MG PO TABS: 1.0000 | ORAL_TABLET | ORAL | 0 refills | Status: DC | PRN

## 2024-02-25 MED ORDER — ORAL CARE MOUTH RINSE: 15.0000 mL | Freq: Once | OROMUCOSAL | Status: AC

## 2024-02-25 MED ORDER — PROPOFOL 10 MG/ML IV BOLUS
INTRAVENOUS | Status: AC
  Filled 2024-02-25: qty 20

## 2024-02-25 MED ORDER — SODIUM CHLORIDE 0.9 % IV BOLUS
1000.0000 mL | Freq: Once | INTRAVENOUS | Status: AC
  Administered 2024-02-25: 1000 mL via INTRAVENOUS

## 2024-02-25 MED ORDER — AMOXICILLIN-POT CLAVULANATE 875-125 MG PO TABS: 1.0000 | ORAL_TABLET | Freq: Two times a day (BID) | ORAL | 0 refills | Status: DC

## 2024-02-25 MED ORDER — FENTANYL CITRATE (PF) 250 MCG/5ML IJ SOLN
INTRAMUSCULAR | Status: AC
  Filled 2024-02-25: qty 5

## 2024-02-25 MED ORDER — MIDAZOLAM HCL 2 MG/2ML IJ SOLN
INTRAMUSCULAR | Status: AC
  Filled 2024-02-25: qty 2

## 2024-02-25 MED ORDER — PROCHLORPERAZINE EDISYLATE 10 MG/2ML IJ SOLN: 10.0000 mg | INTRAMUSCULAR | Status: DC | PRN

## 2024-02-25 MED ORDER — SUCCINYLCHOLINE CHLORIDE 200 MG/10ML IV SOSY
PREFILLED_SYRINGE | INTRAVENOUS | Status: DC | PRN
Start: 2024-02-25 — End: 2024-02-25
  Administered 2024-02-25: 120 mg via INTRAVENOUS

## 2024-02-25 MED ORDER — ONDANSETRON HCL 4 MG/2ML IJ SOLN
4.0000 mg | Freq: Once | INTRAMUSCULAR | Status: AC
  Administered 2024-02-25: 4 mg via INTRAVENOUS
  Filled 2024-02-25: qty 2

## 2024-02-25 MED ORDER — METRONIDAZOLE 500 MG/100ML IV SOLN
500.0000 mg | Freq: Three times a day (TID) | INTRAVENOUS | Status: DC
  Administered 2024-02-25: 500 mg via INTRAVENOUS
  Filled 2024-02-25: qty 100

## 2024-02-25 MED ORDER — SODIUM CHLORIDE 0.9 % IV SOLN: 12.5000 mg | INTRAVENOUS | Status: DC | PRN

## 2024-02-25 MED ORDER — AMOXICILLIN-POT CLAVULANATE 875-125 MG PO TABS
1.0000 | ORAL_TABLET | Freq: Two times a day (BID) | ORAL | Status: DC
Start: 2024-02-26 — End: 2024-02-26
  Administered 2024-02-26: 1 via ORAL
  Filled 2024-02-25: qty 1

## 2024-02-25 MED ORDER — CHLORHEXIDINE GLUCONATE 0.12 % MT SOLN
OROMUCOSAL | Status: AC
  Administered 2024-02-25: 15 mL via OROMUCOSAL
  Filled 2024-02-25: qty 15

## 2024-02-25 MED ORDER — SIMETHICONE 80 MG PO CHEW: 80.0000 mg | CHEWABLE_TABLET | Freq: Four times a day (QID) | ORAL | Status: DC | PRN

## 2024-02-25 MED ORDER — ACETAMINOPHEN 500 MG PO TABS
1000.0000 mg | ORAL_TABLET | Freq: Once | ORAL | Status: AC
  Administered 2024-02-25: 1000 mg via ORAL

## 2024-02-25 MED ORDER — CHLORHEXIDINE GLUCONATE 0.12 % MT SOLN: 15.0000 mL | Freq: Once | OROMUCOSAL | Status: AC

## 2024-02-25 SURGICAL SUPPLY — 45 items
APPLIER CLIP 5 13 M/L LIGAMAX5 (MISCELLANEOUS) IMPLANT
BAG COUNTER SPONGE SURGICOUNT (BAG) ×1 IMPLANT
BLADE CLIPPER SURG (BLADE) IMPLANT
CANISTER SUCT 3000ML PPV (MISCELLANEOUS) ×1 IMPLANT
CHLORAPREP W/TINT 26 (MISCELLANEOUS) ×1 IMPLANT
CLIP APPLIE 5 13 M/L LIGAMAX5 (MISCELLANEOUS) IMPLANT
COVER SURGICAL LIGHT HANDLE (MISCELLANEOUS) ×1 IMPLANT
DERMABOND ADVANCED .7 DNX12 (GAUZE/BANDAGES/DRESSINGS) ×1 IMPLANT
ELECT REM PT RETURN 9FT ADLT (ELECTROSURGICAL) ×1 IMPLANT
ELECTRODE REM PT RTRN 9FT ADLT (ELECTROSURGICAL) ×1 IMPLANT
GLOVE BIO SURGEON STRL SZ7.5 (GLOVE) ×1 IMPLANT
GLOVE BIOGEL PI IND STRL 8 (GLOVE) ×1 IMPLANT
GOWN STRL REUS W/ TWL LRG LVL3 (GOWN DISPOSABLE) ×2 IMPLANT
GOWN STRL REUS W/ TWL XL LVL3 (GOWN DISPOSABLE) ×1 IMPLANT
GRASPER SUT TROCAR 14GX15 (MISCELLANEOUS) ×1 IMPLANT
IRRIG SUCT STRYKERFLOW 2 WTIP (MISCELLANEOUS) ×1 IMPLANT
IRRIGATION SUCT STRKRFLW 2 WTP (MISCELLANEOUS) ×1 IMPLANT
KIT BASIN OR (CUSTOM PROCEDURE TRAY) ×1 IMPLANT
KIT TURNOVER KIT B (KITS) ×1 IMPLANT
NDL 22X1.5 STRL (OR ONLY) (MISCELLANEOUS) ×1 IMPLANT
NDL INSUFFLATION 14GA 120MM (NEEDLE) ×1 IMPLANT
NEEDLE 22X1.5 STRL (OR ONLY) (MISCELLANEOUS) ×1 IMPLANT
NEEDLE INSUFFLATION 14GA 120MM (NEEDLE) ×1 IMPLANT
NS IRRIG 1000ML POUR BTL (IV SOLUTION) ×1 IMPLANT
PAD ARMBOARD 7.5X6 YLW CONV (MISCELLANEOUS) ×2 IMPLANT
RELOAD STAPLE 60 2.6 WHT THN (STAPLE) ×1 IMPLANT
RELOAD STAPLER WHITE 60MM (STAPLE) ×1 IMPLANT
SCISSORS LAP 5X35 DISP (ENDOMECHANICALS) IMPLANT
SET TUBE SMOKE EVAC HIGH FLOW (TUBING) ×1 IMPLANT
SHEARS HARMONIC 36 ACE (MISCELLANEOUS) IMPLANT
SHEARS HARMONIC ACE PLUS 36CM (ENDOMECHANICALS) ×1 IMPLANT
SLEEVE Z-THREAD 5X100MM (TROCAR) ×1 IMPLANT
SPECIMEN JAR SMALL (MISCELLANEOUS) ×1 IMPLANT
STAPLER POWER ECHELON 60 WIDE (STAPLE) ×1 IMPLANT
STAPLER RELOAD WHITE 60MM (STAPLE) ×1 IMPLANT
SUT MNCRL AB 4-0 PS2 18 (SUTURE) ×1 IMPLANT
SYS BAG RETRIEVAL 10MM (BASKET) ×1 IMPLANT
SYSTEM BAG RETRIEVAL 10MM (BASKET) ×1 IMPLANT
TOWEL GREEN STERILE FF (TOWEL DISPOSABLE) ×1 IMPLANT
TRAY FOLEY W/BAG SLVR 16FR ST (SET/KITS/TRAYS/PACK) IMPLANT
TRAY LAPAROSCOPIC MC (CUSTOM PROCEDURE TRAY) ×1 IMPLANT
TROCAR Z THREAD OPTICAL 12X100 (TROCAR) ×1 IMPLANT
TROCAR Z-THREAD OPTICAL 5X100M (TROCAR) ×1 IMPLANT
WARMER LAPAROSCOPE (MISCELLANEOUS) ×1 IMPLANT
WATER STERILE IRR 1000ML POUR (IV SOLUTION) ×1 IMPLANT

## 2024-02-25 NOTE — Anesthesia Postprocedure Evaluation (Signed)
 Anesthesia Post Note  Patient: Catherine Park  Procedure(s) Performed: APPENDECTOMY, LAPAROSCOPIC (Abdomen)     Patient location during evaluation: PACU Anesthesia Type: General Level of consciousness: awake and alert Pain management: pain level controlled Vital Signs Assessment: post-procedure vital signs reviewed and stable Respiratory status: spontaneous breathing, nonlabored ventilation and respiratory function stable Cardiovascular status: blood pressure returned to baseline and stable Postop Assessment: no apparent nausea or vomiting Anesthetic complications: no  No notable events documented.  Last Vitals:  Vitals:   02/25/24 1930 02/25/24 2000  BP: 127/76 113/68  Pulse: 100 (!) 109  Resp: 14 18  Temp: 36.9 C 37.4 C  SpO2: 94% 99%    Last Pain:  Vitals:   02/25/24 2042  TempSrc:   PainSc: 7                  Idan Prime,W. EDMOND

## 2024-02-25 NOTE — Discharge Instructions (Signed)
 APPENDECTOMY POST OPERATIVE INSTRUCTIONS  Thinking Clearly  The anesthesia may cause you to feel different for 1 or 2 days. Do not drive, drink alcohol, or make any big decisions for at least 2 days.  Nutrition When you wake up, you will be able to drink small amounts of liquid. If you do not feel sick, you can slowly advance your diet to regular foods. Continue to drink lots of fluids, usually about 8 to 10 glasses per day. Eat a high-fiber diet so you don't strain during bowel movements. High-Fiber Foods Foods high in fiber include beans, bran cereals and whole-grain breads, peas, dried fruit (figs, apricots, and dates), raspberries, blackberries, strawberries, sweet corn, broccoli, baked potatoes with skin, plums, pears, apples, greens, and nuts. Activity Slowly increase your activity. Be sure to get up and walk every hour or so to prevent blood clots. No heavy lifting or strenuous activity for 4 weeks following surgery to prevent hernias at your incision sites It is normal to feel tired. You may need more sleep than usual.  Get your rest but make sure to get up and move around frequently to prevent blood clots and pneumonia.  Work and Return to [employer] can go back to work when you feel well enough. Discuss the timing with your surgeon. You can usually go back to school or work 1 week or less after an operation for an unruptured appendix and up to 2 weeks after a ruptured appendix. If your work requires heavy lifting or strenuous activity you need to be placed on light duty for 4 weeks following surgery. You can return to gym class, sports or other physical activities 4 weeks after surgery.  Wound Care Always wash your hands before and after touching near your incision site. Do not soak in a bathtub until cleared at your follow up appointment. You may take a shower 24 hours after surgery. A small amount of drainage from the incision is normal. If the drainage is thick and yellow or  the site is red, you may have an infection, so call your surgeon. If you have a drain in one of your incisions, it will be taken out in office when the drainage stops. Steri-Strips will fall off in 7 to 10 days or they will be removed during your first office visit. If you have dermabond glue covering over the incision, allow the glue to flake off on its own. Avoid wearing tight or rough clothing. It may rub your incisions and make it harder for them to heal. Protect the new skin, especially from the sun. The sun can burn and cause darker scarring. Your scar will heal in about 4 to 6 weeks and will become softer and continue to fade over the next year.  The cosmetic appearance of the incisions will improve over the course of the first year after surgery. Sensation around your incision will return in a few weeks or months.  Bowel Movements After intestinal surgery, you may have loose watery stools for several days. If watery diarrhea lasts longer than 3 days, contact your surgeon. Pain medication (narcotics) can cause constipation. Increase the fiber in your diet with high-fiber foods if you are constipated. You can take an over the counter stool softener like Colace to avoid constipation.  Additional over the counter medications can also be used if Colace isn't sufficient (for example, Milk of Magnesia or Miralax).  Pain The amount of pain is different for each person. Some people need only 1 to  3 doses of pain control medication, while others need more. Take alternating doses of tylenol and ibuprofen around the clock for the first five days following surgery.  This will provide a baseline of pain control and help with inflammation.  Take the narcotic pain medication in addition if needed for severe pain.  Contact Your Surgeon at [phone], if you have: Pain that will not go away Pain that gets worse A fever of more than 101F (38.3C) Repeated vomiting Swelling, redness, bleeding, or  bad-smelling drainage from your wound site Strong abdominal pain No bowel movement or unable to pass gas for 3 days Watery diarrhea lasting longer than 3 days  Pain Control The goal of pain control is to minimize pain, keep you moving and help you heal. Your surgical team will work with you on your pain plan. Most often a combination of therapies and medications are used to control your pain. You may also be given medication (local anesthetic) at the surgical site. This may help control your pain for several days. Extreme pain puts extra stress on your body at a time when your body needs to focus on healing. Do not wait until your pain has reached a level "10" or is unbearable before telling your doctor or nurse. It is much easier to control pain before it becomes severe. Following a laparoscopic procedure, pain is sometimes felt in the shoulder. This is due to the gas inserted into your abdomen during the procedure. Moving and walking helps to decrease the gas and the right shoulder pain.  Use the guide below for ways to manage your post-operative pain. Learn more by going to facs.org/safepaincontrol.  How Intense Is My Pain Common Therapies to Feel Better       I hardly notice my pain, and it does not interfere with my activities.  I notice my pain and it distracts me, but I can still do activities (sitting up, walking, standing).  Non-Medication Therapies  Ice (in a bag, applied over clothing at the surgical site), elevation, rest, meditation, massage, distraction (music, TV, play) walking and mild exercise Splinting the abdomen with pillows +  Non-Opioid Medications Acetaminophen (Tylenol) Non-steroidal anti-inflammatory drugs (NSAIDS) Aspirin, Ibuprofen (Motrin, Advil) Naproxen (Aleve) Take these as needed, when you feel pain. Both acetaminophen and NSAIDs help to decrease pain and swelling (inflammation).      My pain is hard to ignore and is more noticeable even when I  rest.  My pain interferes with my usual activities.  Non-Medication Therapies  +  Non-Opioid medications  Take on a regular schedule (around-the-clock) instead of as needed. (For example, Tylenol every 6 hours at 9:00 am, 3:00 pm, 9:00 pm, 3:00 am and Motrin every 6 hours at 12:00 am, 6:00 am, 12:00 pm, 6:00 pm)         I am focused on my pain, and I am not doing my daily activities.  I am groaning in pain, and I cannot sleep. I am unable to do anything.  My pain is as bad as it could be, and nothing else matters.  Non-Medication Therapies  +  Around-the-Clock Non-Opioid Medications  +  Short-acting opioids  Opioids should be used with other medications to manage severe pain. Opioids block pain and give a feeling of euphoria (feel high). Addiction, a serious side effect of opioids, is rare with short-term (a few days) use.  Examples of short-acting opioids include: Tramadol (Ultram), Hydrocodone (Norco, Vicodin), Hydromorphone (Dilaudid), Oxycodone (Oxycontin)     The above  directions have been adapted from the [employer] Surgical Patient Education Program.  Please refer to the ACS website if needed: http://aguilar-moyer.com/.   [name], MD [facility] [address], [place], [place]  [address] ?  [address] [address], [place], [place]   [address] [phone] ? [phone] ? FAX [phone] Web site: [email or web address]    As we discussed the pharmacist recommended that you pump and dump until you are cleared by your pediatrician to give your baby breast milk with the medications you are being sent home on.

## 2024-02-25 NOTE — ED Triage Notes (Addendum)
 Pt c/o lower abdominal pain, nausea and vomiting. Pt denies diarrhea or constipation. Pt had vaginal delivery on 01/19/24.

## 2024-02-25 NOTE — ED Provider Triage Note (Signed)
 Emergency Medicine Provider Triage Evaluation Note  Catherine Park , a 28 y.o. female  was evaluated in triage.  Pt complains of right lower quadrant abdominal pain.  Review of Systems  Positive: Decreased appetite, chills, nausea, vomiting, pain that started in the central abdomen and migrated to the right lower quadrant Negative: Constipation, diarrhea, dysuria, hematuria, vaginal bleeding, vaginal discharge, trauma  Physical Exam  BP 108/72 (BP Location: Right Arm)   Pulse 78   Temp 97.6 F (36.4 C)   Resp 16   Ht 5\' 2"  (1.575 m)   Wt 85.7 kg   LMP  (LMP Unknown)   SpO2 98%   BMI 34.57 kg/m  Gen:   Awake, no distress   Resp:  Normal effort  MSK:   Moves extremities without difficulty  Other:  Significant tenderness in right lower quadrant.  Less tender in the right flank and did not have CVA tenderness.  Medical Decision Making  Medically screening exam initiated at 10:33 AM.  Appropriate orders placed.  Catherine Park was informed that the remainder of the evaluation will be completed by another provider, this initial triage assessment does not replace that evaluation, and the importance of remaining in the ED until their evaluation is complete.  Catherine Park is a 28 y.o. female with a past medical history significant for hyperlipidemia and recent vaginal livery on 01/19/2024 who presents with abdominal pain.  According to patient, starting yesterday, start having pain in her central abdomen to her umbilicus.  She reports overnight and today it is migrated to her right lower quadrant where it is now severe and up to 10 out of 10.  She reports associated nausea and vomiting and fatigue and chills.  She has no appetite.  She denies any hematuria or urinary symptoms but does have a family history of kidney stones.  She still has her appendix.  She denies any vaginal bleeding or vaginal discharge and has not had no problems or complications since delivery.  She is  breast-feeding currently but reports she has saved some milk if she needs to get medicines or antibiotics and cannot breast-feed for a time.  She reports no congestion, rhinorrhea, cough, chest pain, shortness of breath.  No URI symptoms.  On exam, lungs clear.  Chest nontender.  Abdomen is tender in the right lower quadrant.  Bowel sounds were appreciated.  CVA areas and flanks nontender on my exam.  Patient otherwise well-appearing.  Clinically most concerned about appendicitis.  We will get labs and a CT scan.  Anticipate disposition based on workup results when she gets seen by another provider.      Catherine Park, Canary Brim, MD 02/25/24 1041

## 2024-02-25 NOTE — Transfer of Care (Signed)
 Immediate Anesthesia Transfer of Care Note  Patient: Catherine Park  Procedure(s) Performed: APPENDECTOMY, LAPAROSCOPIC (Abdomen)  Patient Location: PACU  Anesthesia Type:General  Level of Consciousness: awake and sedated  Airway & Oxygen Therapy: Patient Spontanous Breathing and Patient connected to face mask oxygen  Post-op Assessment: Report given to RN and Post -op Vital signs reviewed and stable  Post vital signs: Reviewed and stable  Last Vitals:  Vitals Value Taken Time  BP 121/71 02/25/24 1830  Temp    Pulse 92 02/25/24 1831  Resp 22 02/25/24 1831  SpO2 90 % 02/25/24 1831  Vitals shown include unfiled device data.  Last Pain:  Vitals:   02/25/24 1826  TempSrc:   PainSc: Asleep         Complications: No notable events documented.

## 2024-02-25 NOTE — H&P (Addendum)
 History and Physical Exam Note  Catherine Park May 08, 1996  [id number].    Requesting MD: Dr. Rush Landmark Chief Complaint/Reason for Consult: Acute appendicitis  HPI:  28 y.o. female with medical history significant for recent vaginal delivery 5 weeks ago who presented to [facility] ED with abdominal pain. She has had associated nausea, vomiting, fever and chills. Pain began periumbilical and moved to RLQ and has been worsening. She states she had a similar episode when pregnant with her first child and was told appendicitis may have been the source but did not undergo treatment at that time. She denies respiratory, urinary, and vaginal symptoms  She denies medical problems otherwise. She denies prior abdominal surgeries. She denies alcohol and substance use. She is breastfeeding.  She is accompanied by her sister  ROS: ROS reviewed and as above  Family History  Problem Relation Age of Onset   Hyperlipidemia Mother     Past Medical History:  Diagnosis Date   Hyperlipidemia    Medical history non-contributory     Past Surgical History:  Procedure Laterality Date   WISDOM TOOTH EXTRACTION      Social History:  reports that she has never smoked. She has never used smokeless tobacco. She reports that she does not drink alcohol and does not use drugs.  Allergies: No Known Allergies  (Not in a hospital admission)   Blood pressure 121/71, pulse 91, temperature 98.9 F (37.2 C), temperature source Oral, resp. rate 19, height 5\' 2"  (1.575 m), weight 85.7 kg, SpO2 100%, currently breastfeeding. Physical Exam: General: pleasant, WD, female who is laying in bed in NAD HEENT: head is normocephalic, atraumatic.  Sclera are noninjected.  Pupils equal and round. EOMs intact.  Ears and nose without any masses or lesions.  Mouth is pink and moist Heart: regular, rate, and rhythm.  Lungs: Respiratory effort nonlabored Abd: soft, moderate TTP RLQ with localized peritonitis MSK: all  4 extremities are symmetrical with no cyanosis, clubbing, or edema. Skin: warm and dry with no masses, lesions, or rashes Neuro: Cranial nerves 2-12 grossly intact, sensation is normal throughout Psych: A&Ox3 with an appropriate affect.    Results for orders placed or performed during the hospital encounter of 02/25/24 (from the past 48 hours)  Lipase, blood     Status: None   Collection Time: 02/25/24  9:56 AM  Result Value Ref Range   Lipase 32 11 - 51 U/L    Comment: Performed at [facility], [address] [place], [place] [address]  Comprehensive metabolic panel     Status: Abnormal   Collection Time: 02/25/24  9:56 AM  Result Value Ref Range   Sodium 139 135 - 145 mmol/L   Potassium 4.1 3.5 - 5.1 mmol/L   Chloride 100 98 - 111 mmol/L   CO2 24 22 - 32 mmol/L   Glucose, Bld 107 (H) 70 - 99 mg/dL    Comment: Glucose reference range applies only to samples taken after fasting for at least 8 hours.   BUN 9 6 - 20 mg/dL   Creatinine, Ser 1.91 0.44 - 1.00 mg/dL   Calcium 9.8 8.9 - 47.8 mg/dL   Total Protein 8.0 6.5 - 8.1 g/dL   Albumin 4.1 3.5 - 5.0 g/dL   AST 20 15 - 41 U/L   ALT 16 0 - 44 U/L   Alkaline Phosphatase 98 38 - 126 U/L   Total Bilirubin 0.6 0.0 - 1.2 mg/dL   GFR, Estimated >29 >  60 mL/min    Comment: (NOTE) Calculated using the CKD-EPI Creatinine Equation (2021)    Anion gap 15 5 - 15    Comment: Performed at [facility], [address] [place], [place] [address]  CBC     Status: Abnormal   Collection Time: 02/25/24  9:56 AM  Result Value Ref Range   WBC 15.6 (H) 4.0 - 10.5 K/uL   RBC 5.17 (H) 3.87 - 5.11 MIL/uL   Hemoglobin 13.5 12.0 - 15.0 g/dL   HCT 60.4 54.0 - 98.1 %   MCV 81.4 80.0 - 100.0 fL   MCH 26.1 26.0 - 34.0 pg   MCHC 32.1 30.0 - 36.0 g/dL   RDW 19.1 47.8 - 29.5 %   Platelets 316 150 - 400 K/uL   nRBC 0.0 0.0 - 0.2 %    Comment: Performed at [facility], [address], [place], [place] [address]  hCG, serum, qualitative      Status: None   Collection Time: 02/25/24  9:56 AM  Result Value Ref Range   Preg, Serum NEGATIVE NEGATIVE    Comment:        THE SENSITIVITY OF THIS METHODOLOGY IS >10 mIU/mL. Performed at [facility], [address] [place], [place] [address]   I-Stat CG4 Lactic Acid     Status: None   Collection Time: 02/25/24 10:50 AM  Result Value Ref Range   Lactic Acid, Venous 1.4 0.5 - 1.9 mmol/L   CT ABDOMEN PELVIS W CONTRAST Result Date: 02/25/2024 CLINICAL DATA:  Acute right lower quadrant abdominal pain. EXAM: CT ABDOMEN AND PELVIS WITH CONTRAST TECHNIQUE: Multidetector CT imaging of the abdomen and pelvis was performed using the standard protocol following bolus administration of intravenous contrast. RADIATION DOSE REDUCTION: This exam was performed according to the departmental dose-optimization program which includes automated exposure control, adjustment of the mA and/or kV according to patient size and/or use of iterative reconstruction technique. CONTRAST:  75mL OMNIPAQUE IOHEXOL 350 MG/ML SOLN COMPARISON:  None Available. FINDINGS: Lower chest: No acute abnormality. Hepatobiliary: No focal liver abnormality is seen. No gallstones, gallbladder wall thickening, or biliary dilatation. Pancreas: Unremarkable. No pancreatic ductal dilatation or surrounding inflammatory changes. Spleen: Normal in size without focal abnormality. Adrenals/Urinary Tract: Adrenal glands are unremarkable. Kidneys are normal, without renal calculi, focal lesion, or hydronephrosis. Bladder is unremarkable. Stomach/Bowel: The stomach is unremarkable. There is no evidence of bowel obstruction. The appendix is enlarged with maximum diameter of 15 mm and contains appendicolith consistent with acute appendicitis. No definite abscess formation is noted at this time. Vascular/Lymphatic: No significant vascular findings are present. No enlarged abdominal or pelvic lymph nodes. Reproductive: Uterus and bilateral adnexa are  unremarkable. Other: No abdominal wall hernia or abnormality. No abdominopelvic ascites. Musculoskeletal: No acute or significant osseous findings. IMPRESSION: Findings consistent with acute appendicitis with appendicolith. Electronically Signed   By: Lupita Raider M.D.   On: 02/25/2024 14:52      Assessment/Plan Acute appendicitis  Patient seen and examined and relevant labs and imaging personally reviewed. Work up and presentation consistent with acute appendicitis. Recommend laparoscopic appendectomy for definitive management  I have discussed the procedure and risks of appendectomy. The risks include but are not limited to bleeding, infection, wound problems, anesthesia, injury to intra-abdominal organs, possibility of postoperative ileus. She seems to understand and agrees with the plan.  OR tonight and discharge from PACU vs overnight observation pending operative findings.  Breast pump to bedside ordered.  FEN: NPO ID: [email or web address] VTE: SCDs  I reviewed ED provider notes, last 24 h vitals and pain scores, last 48 h intake and output, last 24 h labs and trends, and last 24 h imaging results.   Eric Form, [facility] Surgery 02/25/2024, 3:48 PM Please see Amion for pager number during day hours 7:00am-4:30pm

## 2024-02-25 NOTE — ED Triage Notes (Signed)
 Pt  reports she has had n/v and mid to low right side  abdominal pain x 1 day

## 2024-02-25 NOTE — Anesthesia Procedure Notes (Signed)
 Procedure Name: Intubation Date/Time: 02/25/2024 5:39 PM  Performed by: Debbe Odea, CRNAPre-anesthesia Checklist: Patient identified, Emergency Drugs available, Suction available and Patient being monitored Patient Re-evaluated:Patient Re-evaluated prior to induction Oxygen Delivery Method: Circle System Utilized Preoxygenation: Pre-oxygenation with 100% oxygen Induction Type: IV induction Ventilation: Mask ventilation without difficulty Laryngoscope Size: Miller and 2 Grade View: Grade I Tube type: Oral Tube size: 7.0 mm Number of attempts: 1 Airway Equipment and Method: Stylet Placement Confirmation: ETT inserted through vocal cords under direct vision, positive ETCO2 and breath sounds checked- equal and bilateral Secured at: 22 cm Tube secured with: Tape Dental Injury: Teeth and Oropharynx as per pre-operative assessment

## 2024-02-25 NOTE — ED Notes (Addendum)
 Patient is being discharged from the Urgent Care and sent to the Emergency Department via POV. Per PA, patient is in need of higher level of care due to abdominal pain. Patient is aware and verbalizes understanding of plan of care.  Vitals:   02/25/24 0817  BP: 121/84  Pulse: 73  Resp: 16  Temp: 99 F (37.2 C)  SpO2: 95%   Pt family driving pt to ED.

## 2024-02-25 NOTE — Anesthesia Preprocedure Evaluation (Addendum)
 Anesthesia Evaluation  Patient identified by MRN, date of birth, ID band Patient awake    Reviewed: Allergy & Precautions, NPO status , Patient's Chart, lab work & pertinent test results  History of Anesthesia Complications Negative for: history of anesthetic complications  Airway Mallampati: I  TM Distance: >3 FB Neck ROM: Full    Dental  (+) Dental Advisory Given, Teeth Intact   Pulmonary neg pulmonary ROS   Pulmonary exam normal        Cardiovascular negative cardio ROS Normal cardiovascular exam     Neuro/Psych negative neurological ROS  negative psych ROS   GI/Hepatic Neg liver ROS,,, Appendicitis    Endo/Other   Obesity   Renal/GU negative Renal ROS     Musculoskeletal negative musculoskeletal ROS (+)    Abdominal   Peds  Hematology negative hematology ROS (+)   Anesthesia Other Findings   Reproductive/Obstetrics (+) Breast feeding                              Anesthesia Physical Anesthesia Plan  ASA: 2  Anesthesia Plan: General   Post-op Pain Management: Ofirmev IV (intra-op)* and Toradol IV (intra-op)*   Induction: Intravenous and Rapid sequence  PONV Risk Score and Plan: 3 and Treatment may vary due to age or medical condition, Ondansetron, Dexamethasone, Midazolam and Scopolamine patch - Pre-op  Airway Management Planned: Oral ETT  Additional Equipment: None  Intra-op Plan:   Post-operative Plan: Extubation in OR  Informed Consent: I have reviewed the patients History and Physical, chart, labs and discussed the procedure including the risks, benefits and alternatives for the proposed anesthesia with the patient or authorized representative who has indicated his/her understanding and acceptance.     Dental advisory given  Plan Discussed with: CRNA and Anesthesiologist  Anesthesia Plan Comments:         Anesthesia Quick Evaluation

## 2024-02-25 NOTE — ED Notes (Signed)
 Breast pump provided to pt from MAU. Picked up by [employer]. Pump going with pt to short stay if needed after surgery. Pt assisted with operating pump.

## 2024-02-25 NOTE — Progress Notes (Signed)
 Pt's 2 bottles of breastmilk brought to PACU and placed in the fridge.

## 2024-02-25 NOTE — Op Note (Signed)
 Patient: Catherine Park (11-Nov-1996, [id number])  Date of Surgery: 02/25/2024  Preoperative Diagnosis: Acute appendicitis   Postoperative Diagnosis: Acute appendicitis   Surgical Procedure: APPENDECTOMY, LAPAROSCOPIC  Operative Team Members:  Surgeons and Role:    * Jeilyn Reznik, Hyman Hopes, MD - Primary   Anesthesiologist: Beryle Lathe, MD; Gaynelle Adu, MD CRNA: Debbe Odea, CRNA   Anesthesia: General   Fluids:  No intake/output data recorded.  Complications: None  Drains:  none   Specimen:  ID Type Source Tests Collected by Time Destination  1 : Appendix Tissue PATH Appendix SURGICAL PATHOLOGY [name], Hyman Hopes, MD 02/25/2024 1802      Disposition:  PACU - hemodynamically stable.  Plan of Care: Admit for overnight observation    Indications for Procedure: Catherine Park is a 28 y.o. female who presented with abdominal pain.  History, physical and imaging was concerning for appendicitis, so laparoscopic appendectomy was recommended for the patient.  The procedure itself, as well as the risks, benefits and alternatives were discussed with the patient.  Risks discussed included but were not limited to the risk of bleeding, infection, damage to nearby structures, need to convert to open procedure, incisional hernia, and the need for additional procedures or surgeries.  With this discussion complete and all questions answered the patient granted consent to proceed.  Findings: Inflamed appendix with localized perforation  Infection status: Patient: Catherine Park Emergency General Surgery Service Patient Case: Urgent Infection Present At Time Of Surgery (PATOS):  Localized perforation of appendix   Description of Procedure:   On the date stated above, the patient was taken to the operating room suite and placed in supine positioning with the left arm tucked.  Sequential compression devices were placed on the lower extremities to prevent blood  clots.  General endotracheal anesthesia was induced.  Preoperative antibiotics were given.  The patient's abdomen was prepped and draped in the usual sterile fashion.  A time-out was completed verifying the correct patient, procedure, positioning and equipment needed for the case.  We began by anesthetizing the skin with local anesthetic and then making a 5 mm incision just below the umbilicus.  We dissected through the subcutaneous tissues to the fascia.  The fascia was grasped and elevated using a Kocher clamp.  A Veress needle was inserted into the abdomen and the abdomen was insufflated to 15 mmHg.  A 5 mm trocar was inserted in this position under optical guidance and then the abdomen was inspected.  There was no trauma to the underlying viscera with initial trocar placement.  Any abnormal findings, other than inflammation in the right lower quadrant, are listed above in the findings section.  Two additional trocars were placed, one 5 mm trocar suprapubically and one 12 mm trocar in the left lower quadrant.  These were placed under direct vision without any trauma to the underlying viscera.    The patient was then placed in head down, left side down positioning.  The appendix was identified and dissected free from its attachments to the abdominal wall, small intestine and cecum.  A window was created in the mesoappendix using blunt dissection.  We used one 60 mm white load of the endoscopic linear stapler to divide the base of the appendix from the cecum.  Then the harmonic scalpel was used to divide the mesoappendix.  The appendix was removed through the 12 mm port site in the left lower quadrant.  A suction irrigator was used to clean the operative field. The staple line  was well formed.  There was good hemostasis at the end of the case.  At this point we directed our attention to closure.  The patient was moved back to a level position.  The 12 mm trocar site was closed at the fascial level using an  0-vicryl on a fascial suture passer.  The abdomen was desufflated.  The skin was closed using 4-0 Monocryl and dermabond.  All sponge and needle counts were correct at the end of the case.    [name], MD General, Bariatric, & Minimally Invasive Surgery [facility] Surgery, [place]

## 2024-02-25 NOTE — ED Provider Notes (Signed)
 [place] EMERGENCY DEPARTMENT AT [facility] Provider Note   CSN: [id number] Arrival date & time: 02/25/24  0945     History  Chief Complaint  Patient presents with   Abdominal Pain    Catherine Park is a 28 y.o. female.  The history is provided by the patient and medical records. No language interpreter was used.  Abdominal Pain Pain location:  RLQ Pain quality: aching, cramping and sharp   Pain radiates to:  Does not radiate Pain severity:  Severe Onset quality:  Gradual Duration:  2 days Timing:  Constant Progression:  Worsening Chronicity:  New Relieved by:  Nothing Worsened by:  Nothing Ineffective treatments:  None tried Associated symptoms: anorexia, chills, fatigue, fever (chills), nausea and vomiting   Associated symptoms: no chest pain, no constipation, no cough, no diarrhea, no dysuria, no shortness of breath, no vaginal bleeding and no vaginal discharge        Home Medications Prior to Admission medications   Medication Sig Start Date End Date Taking? Authorizing Provider  aspirin-acetaminophen-caffeine (EXCEDRIN MIGRAINE) [phone] MG tablet Take by mouth every 6 (six) hours as needed for headache. Patient not taking: Reported on 02/24/2024    [provider]  ibuprofen (ADVIL) 600 MG tablet Take 1 tablet (600 mg total) by mouth every 6 (six) hours. Patient not taking: Reported on 02/24/2024 01/21/24   Hessie Dibble, MD  Prenatal Vit-Fe Fumarate-FA (MULTIVITAMIN-PRENATAL) 27-0.8 MG TABS tablet Take 1 tablet by mouth daily at 12 noon. Patient not taking: Reported on 02/24/2024    [provider]  senna-docusate (SENOKOT-S) 8.6-50 MG tablet Take 2 tablets by mouth 2 (two) times daily as needed for mild constipation. Patient not taking: Reported on 02/24/2024 01/21/24   Hessie Dibble, MD      Allergies    Patient has no known allergies.    Review of Systems   Review of Systems  Constitutional:  Positive for chills, fatigue  and fever (chills).  HENT:  Negative for congestion.   Respiratory:  Negative for cough, chest tightness, shortness of breath and wheezing.   Cardiovascular:  Negative for chest pain.  Gastrointestinal:  Positive for abdominal pain, anorexia, nausea and vomiting. Negative for constipation and diarrhea.  Genitourinary:  Negative for dysuria, flank pain, vaginal bleeding and vaginal discharge.  Musculoskeletal:  Negative for back pain and neck pain.  Skin:  Negative for rash.  Neurological:  Negative for headaches.  Psychiatric/Behavioral:  Negative for agitation.   All other systems reviewed and are negative.   Physical Exam Updated Vital Signs BP 108/72 (BP Location: Right Arm)   Pulse 78   Temp 97.6 F (36.4 C)   Resp 16   Ht 5\' 2"  (1.575 m)   Wt 85.7 kg   LMP  (LMP Unknown)   SpO2 98%   BMI 34.57 kg/m  Physical Exam Vitals and nursing note reviewed.  Constitutional:      General: She is not in acute distress.    Appearance: She is well-developed.  HENT:     Head: Normocephalic and atraumatic.  Eyes:     Extraocular Movements: Extraocular movements intact.     Conjunctiva/sclera: Conjunctivae normal.  Cardiovascular:     Rate and Rhythm: Regular rhythm. Tachycardia present.     Heart sounds: No murmur heard. Pulmonary:     Effort: Pulmonary effort is normal. No respiratory distress.     Breath sounds: Normal breath sounds. No wheezing, rhonchi or rales.  Chest:  Chest wall: No tenderness.  Abdominal:     General: Abdomen is flat.     Palpations: Abdomen is soft.     Tenderness: There is abdominal tenderness in the right lower quadrant and periumbilical area. There is no right CVA tenderness, left CVA tenderness, guarding or rebound.  Musculoskeletal:        General: No swelling.     Cervical back: Neck supple.  Skin:    General: Skin is warm and dry.     Capillary Refill: Capillary refill takes less than 2 seconds.     Coloration: Skin is not pale.      Findings: No rash.  Neurological:     General: No focal deficit present.     Mental Status: She is alert.  Psychiatric:        Mood and Affect: Mood normal.     ED Results / Procedures / Treatments   Labs (all labs ordered are listed, but only abnormal results are displayed) Labs Reviewed  COMPREHENSIVE METABOLIC PANEL - Abnormal; Notable for the following components:      Result Value   Glucose, Bld 107 (*)    All other components within normal limits  CBC - Abnormal; Notable for the following components:   WBC 15.6 (*)    RBC 5.17 (*)    All other components within normal limits  LIPASE, BLOOD  HCG, SERUM, QUALITATIVE  URINALYSIS, ROUTINE W REFLEX MICROSCOPIC  I-STAT CG4 LACTIC ACID, ED    EKG None  Radiology CT ABDOMEN PELVIS W CONTRAST Result Date: 02/25/2024 CLINICAL DATA:  Acute right lower quadrant abdominal pain. EXAM: CT ABDOMEN AND PELVIS WITH CONTRAST TECHNIQUE: Multidetector CT imaging of the abdomen and pelvis was performed using the standard protocol following bolus administration of intravenous contrast. RADIATION DOSE REDUCTION: This exam was performed according to the departmental dose-optimization program which includes automated exposure control, adjustment of the mA and/or kV according to patient size and/or use of iterative reconstruction technique. CONTRAST:  75mL OMNIPAQUE IOHEXOL 350 MG/ML SOLN COMPARISON:  None Available. FINDINGS: Lower chest: No acute abnormality. Hepatobiliary: No focal liver abnormality is seen. No gallstones, gallbladder wall thickening, or biliary dilatation. Pancreas: Unremarkable. No pancreatic ductal dilatation or surrounding inflammatory changes. Spleen: Normal in size without focal abnormality. Adrenals/Urinary Tract: Adrenal glands are unremarkable. Kidneys are normal, without renal calculi, focal lesion, or hydronephrosis. Bladder is unremarkable. Stomach/Bowel: The stomach is unremarkable. There is no evidence of bowel  obstruction. The appendix is enlarged with maximum diameter of 15 mm and contains appendicolith consistent with acute appendicitis. No definite abscess formation is noted at this time. Vascular/Lymphatic: No significant vascular findings are present. No enlarged abdominal or pelvic lymph nodes. Reproductive: Uterus and bilateral adnexa are unremarkable. Other: No abdominal wall hernia or abnormality. No abdominopelvic ascites. Musculoskeletal: No acute or significant osseous findings. IMPRESSION: Findings consistent with acute appendicitis with appendicolith. Electronically Signed   By: Lupita Raider M.D.   On: 02/25/2024 14:52    Procedures Procedures    Medications Ordered in ED Medications  ondansetron (ZOFRAN) injection 4 mg (has no administration in time range)  sodium chloride 0.9 % bolus 1,000 mL (has no administration in time range)  fentaNYL (SUBLIMAZE) injection 50 mcg (has no administration in time range)  iohexol (OMNIPAQUE) 350 MG/ML injection 75 mL (75 mLs Intravenous Contrast Given 02/25/24 1232)    ED Course/ Medical Decision Making/ A&P  Medical Decision Making Amount and/or Complexity of Data Reviewed Labs: ordered. Radiology: ordered.  Risk Prescription drug management. Decision regarding hospitalization.   Devan Babino is a 28 y.o. female with a past medical history significant for hyperlipidemia and is 5 weeks postpartum who presents for abdominal pain.  I saw this patient in triage and did for medical screening exam.  Essentially patient has had symptoms since yesterday with pain in her periumbilical is that is now in the right lower quadrant.  She has had chills, nausea, vomiting.  Reports no appetite.  Denies chest pain or shortness of breath and denies any vaginal complaints.  She saw her OB/GYN yesterday and was doing well from a delivery standpoint.  Denies other complaints.  Initially on exam she was having tenderness  in her right lower quadrant.  Lungs were clear and chest nontender.  Back and flanks nontender.  Patient initially had some discomfort but did not seem to need pain medicine from the start.  Patient had orders placed by me to rule out appendicitis with labs and imaging.  Patient would back to the waiting room before now getting to a room.  3:02 PM Patient was bedded into an exam room.  Her CT has returned showing evidence of acute appendicitis.  Will order pain medicine, nausea medicine and fluids for her.  Her white count is elevated at 15.6.  Will call general surgery and anticipate antibiotics and admission for further management.  General surgery will see patient.  They requested to hold on antibiotics until I see her.  Anticipate general surgery will admit for further management.        Final Clinical Impression(s) / ED Diagnoses Final diagnoses:  Acute appendicitis, unspecified acute appendicitis type  RLQ abdominal pain     Clinical Impression: 1. Acute appendicitis, unspecified acute appendicitis type   2. RLQ abdominal pain     Disposition: Admit  This note was prepared with assistance of Dragon voice recognition software. Occasional wrong-word or sound-a-like substitutions may have occurred due to the inherent limitations of voice recognition software.     Sharad Vaneaton, Canary Brim, MD 02/25/24 [phone]

## 2024-02-25 NOTE — Discharge Instructions (Signed)
 Go to the emergency department for further evaluation of your severe abdominal pain.  We do not recommend that you drive yourself, recommend calling someone to drive you.  We have given you nausea medication and Tylenol prior to discharge today.

## 2024-02-26 ENCOUNTER — Encounter (HOSPITAL_COMMUNITY): Payer: Self-pay | Admitting: Surgery

## 2024-02-26 LAB — BASIC METABOLIC PANEL
Anion gap: 7 (ref 5–15)
BUN: 8 mg/dL (ref 6–20)
CO2: 23 mmol/L (ref 22–32)
Calcium: 8.1 mg/dL — ABNORMAL LOW (ref 8.9–10.3)
Chloride: 103 mmol/L (ref 98–111)
Creatinine, Ser: 0.79 mg/dL (ref 0.44–1.00)
GFR, Estimated: >60 mL/min (ref >60)
Glucose, Bld: 122 mg/dL — ABNORMAL HIGH (ref 70–99)
Potassium: 3.5 mmol/L (ref 3.5–5.1)
Sodium: 133 mmol/L — ABNORMAL LOW (ref 135–145)

## 2024-02-26 LAB — CBC
HCT: 36.5 % (ref 36.0–46.0)
Hemoglobin: 12 g/dL (ref 12.0–15.0)
MCH: 26.6 pg (ref 26.0–34.0)
MCHC: 32.9 g/dL (ref 30.0–36.0)
MCV: 80.9 fL (ref 80.0–100.0)
Platelets: 251 10*3/uL (ref 150–400)
RBC: 4.51 MIL/uL (ref 3.87–5.11)
RDW: 14.6 % (ref 11.5–15.5)
WBC: 14.8 10*3/uL — ABNORMAL HIGH (ref 4.0–10.5)
nRBC: 0 % (ref 0.0–0.2)

## 2024-02-26 NOTE — Progress Notes (Signed)
   02/26/24 1024  TOC Brief Assessment  Insurance and Status Reviewed  Patient has primary care physician Yes  Home environment has been reviewed spouse  Prior level of function: independent  Prior/Current Home Services No current home services  Social Drivers of Health Review SDOH reviewed no interventions necessary  Readmission risk has been reviewed No  Transition of care needs no transition of care needs at this time     Transition of Care Department [facility]) has reviewed patient and no TOC needs have been identified at this time. We will continue to monitor patient advancement through interdisciplinary progression rounds. If new patient transition needs arise, please place a TOC consult.

## 2024-02-26 NOTE — Plan of Care (Signed)

## 2024-02-26 NOTE — Progress Notes (Signed)
 1 Day Post-Op  Subjective: CC: Feeling much better. Some RLQ pain that is well controlled with medications. Tolerating cld without n/v. No flatus or bm. Voiding. Mobilizing in the room.   Afebrile. Tachycardia resolved. BP 91/54. Reports BP is usually ~120/80. Weaned to RA. WBC 14.8 from 15.6. Hgb 12 from 13.5. Last required IV pain medication at 0151.   Has a 103 week old and is breastfeeding.   Objective: Vital signs in last 24 hours: Temp:  [97.6 F (36.4 C)-100.2 F (37.9 C)] 99.1 F (37.3 C) (03/05 0745) Pulse Rate:  [64-109] 82 (03/05 0745) Resp:  [11-23] 16 (03/05 0745) BP: (91-127)/(43-85) 91/54 (03/05 0745) SpO2:  [93 %-100 %] 97 % (03/05 0745) Weight:  [85.7 kg] 85.7 kg (03/04 0953)    Intake/Output from previous day: 03/04 0701 - 03/05 0700 In: 500 [I.V.:500] Out: -  Intake/Output this shift: No intake/output data recorded.  PE: Gen:  Alert, NAD, pleasant Card:  Reg Pulm:  CTAB, no W/R/R, effort normal Abd: Soft, no distension, mild RLQ ttp and appropriately tender around laparoscopic incisions, no rigidity or guarding and otherwise NT, +BS. Incisions with glue intact appears well and are without drainage, bleeding, or signs of infection  Ext:  No LE edema Psych: A&Ox3   Lab Results:  Recent Labs    02/25/24 0956 02/26/24 0640  WBC 15.6* 14.8*  HGB 13.5 12.0  HCT 42.1 36.5  PLT 316 251   BMET Recent Labs    02/25/24 0956 02/26/24 0640  NA 139 133*  K 4.1 3.5  CL 100 103  CO2 24 23  GLUCOSE 107* 122*  BUN 9 8  CREATININE 0.70 0.79  CALCIUM 9.8 8.1*   PT/INR No results for input(s): "LABPROT", "INR" in the last 72 hours. CMP     Component Value Date/Time   NA 133 (L) 02/26/2024 0640   K 3.5 02/26/2024 0640   CL 103 02/26/2024 0640   CO2 23 02/26/2024 0640   GLUCOSE 122 (H) 02/26/2024 0640   BUN 8 02/26/2024 0640   CREATININE 0.79 02/26/2024 0640   CREATININE 0.59 04/01/2014 0847   CALCIUM 8.1 (L) 02/26/2024 0640   PROT 8.0  02/25/2024 0956   ALBUMIN 4.1 02/25/2024 0956   AST 20 02/25/2024 0956   ALT 16 02/25/2024 0956   ALKPHOS 98 02/25/2024 0956   BILITOT 0.6 02/25/2024 0956   GFRNONAA >60 02/26/2024 0640   Lipase     Component Value Date/Time   LIPASE 32 02/25/2024 0956    Studies/Results: CT ABDOMEN PELVIS W CONTRAST Result Date: 02/25/2024 CLINICAL DATA:  Acute right lower quadrant abdominal pain. EXAM: CT ABDOMEN AND PELVIS WITH CONTRAST TECHNIQUE: Multidetector CT imaging of the abdomen and pelvis was performed using the standard protocol following bolus administration of intravenous contrast. RADIATION DOSE REDUCTION: This exam was performed according to the departmental dose-optimization program which includes automated exposure control, adjustment of the mA and/or kV according to patient size and/or use of iterative reconstruction technique. CONTRAST:  75mL OMNIPAQUE IOHEXOL 350 MG/ML SOLN COMPARISON:  None Available. FINDINGS: Lower chest: No acute abnormality. Hepatobiliary: No focal liver abnormality is seen. No gallstones, gallbladder wall thickening, or biliary dilatation. Pancreas: Unremarkable. No pancreatic ductal dilatation or surrounding inflammatory changes. Spleen: Normal in size without focal abnormality. Adrenals/Urinary Tract: Adrenal glands are unremarkable. Kidneys are normal, without renal calculi, focal lesion, or hydronephrosis. Bladder is unremarkable. Stomach/Bowel: The stomach is unremarkable. There is no evidence of bowel obstruction. The appendix is enlarged with maximum  diameter of 15 mm and contains appendicolith consistent with acute appendicitis. No definite abscess formation is noted at this time. Vascular/Lymphatic: No significant vascular findings are present. No enlarged abdominal or pelvic lymph nodes. Reproductive: Uterus and bilateral adnexa are unremarkable. Other: No abdominal wall hernia or abnormality. No abdominopelvic ascites. Musculoskeletal: No acute or significant  osseous findings. IMPRESSION: Findings consistent with acute appendicitis with appendicolith. Electronically Signed   By: Lupita Raider M.D.   On: 02/25/2024 14:52    Anti-infectives: Anti-infectives (From admission, onward)    Start     Dose/Rate Route Frequency Ordered Stop   02/26/24 1000  amoxicillin-clavulanate (AUGMENTIN) 875-125 MG per tablet 1 tablet        1 tablet Oral Every 12 hours 02/25/24 1821     02/26/24 0600  cefTRIAXone (ROCEPHIN) 2 g in sodium chloride 0.9 % 100 mL IVPB  Status:  Discontinued       Placed in "And" Linked Group   2 g 200 mL/hr over 30 Minutes Intravenous On call to O.R. 02/25/24 1555 02/25/24 1951   02/26/24 0000  amoxicillin-clavulanate (AUGMENTIN) 875-125 MG tablet        1 tablet Oral Every 12 hours 02/25/24 1822 03/01/24 2359   02/25/24 1600  metroNIDAZOLE (FLAGYL) IVPB 500 mg  Status:  Discontinued       Placed in "And" Linked Group   500 mg 100 mL/hr over 60 Minutes Intravenous Every 8 hours 02/25/24 1555 02/25/24 1951        Assessment/Plan POD 1 s/p laparoscopic appendectomy by Dr. Dossie Der for acute appendicits on 02/25/24 for perforated appendicitis - Cont abx, plan 5d post op - Adv diet - Wean IV pain medications - Mobilize - Pulm toilet - Possible d/c this afternoon if tolerates diet advancement and pain well controlled.    FEN - CLD > Reg. RN rechecking BP, if not improved will plan IVF bolus.  VTE - SCDs, SQH ID - Augmentin  She has 83 week old daughter. Electric breast pump in room and RN getting cooler. Discussed current meds with pharm who recommended pumping and dumping or storing until she is cleared by her peds/OB for baby to receive breast milk. Patient reports she has a frozen breast milk storage and is okay with current plan.    LOS: 0 days    Jacinto Halim, [facility] 02/26/2024, 7:50 AM Please see Amion for pager number during day hours 7:00am-4:30pm

## 2024-02-26 NOTE — Discharge Summary (Signed)
    Patient ID: Catherine Park [id number] [birth year] 28 y.o.  Admit date: 02/25/2024 Discharge date: 02/26/2024   Discharge Diagnosis S/p laparoscopic appendectomy by Dr. Dossie Der for acute appendicits on 02/25/24 for perforated appendicitis   Consultants None  H&P 28 y.o. female with medical history significant for recent vaginal delivery 5 weeks ago who presented to [facility] ED with abdominal pain. She has had associated nausea, vomiting, fever and chills. Pain began periumbilical and moved to RLQ and has been worsening. She states she had a similar episode when pregnant with her first child and was told appendicitis may have been the source but did not undergo treatment at that time. She denies respiratory, urinary, and vaginal symptoms   She denies medical problems otherwise. She denies prior abdominal surgeries. She denies alcohol and substance use. She is breastfeeding.   She is accompanied by her sister  Procedures Dr. Dossie Der - 02/25/24 - laparoscopic appendectomy  Hospital Course:  The patient was admitted and underwent a laparoscopic appendectomy.  The patient tolerated the procedure well.  On POD 1, the patient was tolerating a diet, voiding well, mobilizing, and pain was controlled with oral pain medications.  The patient was stable for DC home at this time with appropriate follow up made.  To note, she has 43 week old daughter. Discussed current meds with pharm who recommended pumping and dumping or storing until she is cleared by her peds/OB for baby to receive breast milk. Patient reports she has a frozen breast milk storage and is okay with current plan.   Allergies as of 02/26/2024   No Known Allergies      Medication List     STOP taking these medications    acetaminophen 500 MG tablet Commonly known as: TYLENOL       TAKE these medications    amoxicillin-clavulanate 875-125 MG tablet Commonly known as: AUGMENTIN Take 1 tablet by mouth every 12  (twelve) hours for 4 days.   oxyCODONE-acetaminophen 5-325 MG tablet Commonly known as: Percocet Take 1 tablet by mouth every 4 (four) hours as needed for severe pain (pain score 7-10).          Follow-up Information     Nickolette Espinola, Hedda Slade, PA-C Follow up.   Specialty: General Surgery Why: Call to confirm your appointment date and time, bring a copy of your photo ID and insurance card, arrive 30 minutes prior to your appointment Contact information: [address] [place] [address] [place] [address] [phone]                 Signed: Leary Roca, [facility] 02/26/2024, 4:31 PM Please see Amion for pager number during day hours 7:00am-4:30pm

## 2024-02-27 ENCOUNTER — Encounter (HOSPITAL_COMMUNITY): Payer: Self-pay

## 2024-02-27 ENCOUNTER — Other Ambulatory Visit: Payer: Self-pay

## 2024-02-27 ENCOUNTER — Encounter: Payer: Self-pay | Admitting: Women's Health

## 2024-02-27 ENCOUNTER — Emergency Department (HOSPITAL_COMMUNITY)

## 2024-02-27 ENCOUNTER — Inpatient Hospital Stay (HOSPITAL_COMMUNITY)
Admission: EM | Admit: 2024-02-27 | Discharge: 2024-03-01 | DRG: 872 | Disposition: A | Discharge status: Home / Self Care | Attending: General Surgery | Admitting: General Surgery

## 2024-02-27 DIAGNOSIS — G8918 Other acute postprocedural pain: Secondary | ICD-10-CM | POA: Diagnosis not present

## 2024-02-27 DIAGNOSIS — Z883 Allergy status to other anti-infective agents status: Secondary | ICD-10-CM

## 2024-02-27 DIAGNOSIS — Z9889 Other specified postprocedural states: Secondary | ICD-10-CM

## 2024-02-27 DIAGNOSIS — R5082 Postprocedural fever: Secondary | ICD-10-CM | POA: Diagnosis not present

## 2024-02-27 DIAGNOSIS — E876 Hypokalemia: Secondary | ICD-10-CM | POA: Diagnosis present

## 2024-02-27 DIAGNOSIS — R509 Fever, unspecified: Secondary | ICD-10-CM | POA: Diagnosis not present

## 2024-02-27 DIAGNOSIS — A419 Sepsis, unspecified organism: Principal | ICD-10-CM | POA: Diagnosis present

## 2024-02-27 DIAGNOSIS — R1030 Lower abdominal pain, unspecified: Secondary | ICD-10-CM | POA: Diagnosis not present

## 2024-02-27 DIAGNOSIS — Z9049 Acquired absence of other specified parts of digestive tract: Secondary | ICD-10-CM

## 2024-02-27 LAB — RESP PANEL BY RT-PCR (RSV, FLU A&B, COVID)  RVPGX2
Influenza A by PCR: NEGATIVE
Influenza B by PCR: NEGATIVE
Resp Syncytial Virus by PCR: NEGATIVE
SARS Coronavirus 2 by RT PCR: NEGATIVE

## 2024-02-27 LAB — COMPREHENSIVE METABOLIC PANEL
ALT: 22 U/L (ref 0–44)
AST: 29 U/L (ref 15–41)
Albumin: 3.5 g/dL (ref 3.5–5.0)
Alkaline Phosphatase: 140 U/L — ABNORMAL HIGH (ref 38–126)
Anion gap: 17 — ABNORMAL HIGH (ref 5–15)
BUN: 8 mg/dL (ref 6–20)
CO2: 23 mmol/L (ref 22–32)
Calcium: 9.2 mg/dL (ref 8.9–10.3)
Chloride: 102 mmol/L (ref 98–111)
Creatinine, Ser: 0.85 mg/dL (ref 0.44–1.00)
GFR, Estimated: >60 mL/min (ref >60)
Glucose, Bld: 119 mg/dL — ABNORMAL HIGH (ref 70–99)
Potassium: 3.4 mmol/L — ABNORMAL LOW (ref 3.5–5.1)
Sodium: 142 mmol/L (ref 135–145)
Total Bilirubin: 1.3 mg/dL — ABNORMAL HIGH (ref 0.0–1.2)
Total Protein: 8 g/dL (ref 6.5–8.1)

## 2024-02-27 LAB — CBC WITH DIFFERENTIAL/PLATELET
Abs Immature Granulocytes: 0.07 10*3/uL (ref 0.00–0.07)
Basophils Absolute: 0.1 10*3/uL (ref 0.0–0.1)
Basophils Relative: 0 %
Eosinophils Absolute: 0.1 10*3/uL (ref 0.0–0.5)
Eosinophils Relative: 1 %
HCT: 37 % (ref 36.0–46.0)
Hemoglobin: 12 g/dL (ref 12.0–15.0)
Immature Granulocytes: 1 %
Lymphocytes Relative: 10 %
Lymphs Abs: 1.2 10*3/uL (ref 0.7–4.0)
MCH: 26.7 pg (ref 26.0–34.0)
MCHC: 32.4 g/dL (ref 30.0–36.0)
MCV: 82.2 fL (ref 80.0–100.0)
Monocytes Absolute: 0.5 10*3/uL (ref 0.1–1.0)
Monocytes Relative: 4 %
Neutro Abs: 9.9 10*3/uL — ABNORMAL HIGH (ref 1.7–7.7)
Neutrophils Relative %: 84 %
Platelets: 271 10*3/uL (ref 150–400)
RBC: 4.5 MIL/uL (ref 3.87–5.11)
RDW: 14.6 % (ref 11.5–15.5)
WBC: 11.8 10*3/uL — ABNORMAL HIGH (ref 4.0–10.5)
nRBC: 0 % (ref 0.0–0.2)

## 2024-02-27 LAB — CYTOLOGY - PAP
Comment: NEGATIVE
Diagnosis: NEGATIVE
High risk HPV: NEGATIVE

## 2024-02-27 LAB — SURGICAL PATHOLOGY

## 2024-02-27 MED ORDER — LACTATED RINGERS IV BOLUS (SEPSIS)
1000.0000 mL | Freq: Once | INTRAVENOUS | Status: AC
  Administered 2024-02-28: 1000 mL via INTRAVENOUS

## 2024-02-27 MED ORDER — VANCOMYCIN HCL 2000 MG/400ML IV SOLN
2000.0000 mg | Freq: Once | INTRAVENOUS | Status: AC
Start: 2024-02-28 — End: 2024-02-28
  Administered 2024-02-28: 2000 mg via INTRAVENOUS
  Filled 2024-02-27: qty 400

## 2024-02-27 MED ORDER — ACETAMINOPHEN 325 MG PO TABS
650.0000 mg | ORAL_TABLET | Freq: Once | ORAL | Status: AC | PRN
  Administered 2024-02-27: 650 mg via ORAL
  Filled 2024-02-27: qty 2

## 2024-02-27 MED ORDER — VANCOMYCIN HCL IN DEXTROSE 1-5 GM/200ML-% IV SOLN: 1000.0000 mg | Freq: Once | INTRAVENOUS | Status: DC

## 2024-02-27 MED ORDER — SODIUM CHLORIDE 0.9 % IV SOLN
2.0000 g | Freq: Once | INTRAVENOUS | Status: AC
  Administered 2024-02-28: 2 g via INTRAVENOUS
  Filled 2024-02-27: qty 12.5

## 2024-02-27 MED ORDER — LACTATED RINGERS IV SOLN: INTRAVENOUS | Status: AC

## 2024-02-27 MED ORDER — METRONIDAZOLE 500 MG/100ML IV SOLN
500.0000 mg | Freq: Once | INTRAVENOUS | Status: AC
  Administered 2024-02-28: 500 mg via INTRAVENOUS
  Filled 2024-02-27: qty 100

## 2024-02-27 NOTE — ED Triage Notes (Signed)
 Pt arrived POV from home stating she had her appendix removed on Tuesday and today started having chills and a fever. Pt took oxycodone around 350pm and her antibiotic around lunch time.

## 2024-02-28 ENCOUNTER — Emergency Department (HOSPITAL_COMMUNITY)

## 2024-02-28 DIAGNOSIS — R109 Unspecified abdominal pain: Secondary | ICD-10-CM | POA: Diagnosis not present

## 2024-02-28 DIAGNOSIS — R509 Fever, unspecified: Secondary | ICD-10-CM | POA: Diagnosis not present

## 2024-02-28 DIAGNOSIS — Z9889 Other specified postprocedural states: Secondary | ICD-10-CM

## 2024-02-28 DIAGNOSIS — N3289 Other specified disorders of bladder: Secondary | ICD-10-CM | POA: Diagnosis not present

## 2024-02-28 LAB — COMPREHENSIVE METABOLIC PANEL
ALT: 16 U/L (ref 0–44)
AST: 17 U/L (ref 15–41)
Albumin: 2.6 g/dL — ABNORMAL LOW (ref 3.5–5.0)
Alkaline Phosphatase: 104 U/L (ref 38–126)
Anion gap: 11 (ref 5–15)
BUN: 6 mg/dL (ref 6–20)
CO2: 21 mmol/L — ABNORMAL LOW (ref 22–32)
Calcium: 8.1 mg/dL — ABNORMAL LOW (ref 8.9–10.3)
Chloride: 106 mmol/L (ref 98–111)
Creatinine, Ser: 0.67 mg/dL (ref 0.44–1.00)
GFR, Estimated: >60 mL/min (ref >60)
Glucose, Bld: 101 mg/dL — ABNORMAL HIGH (ref 70–99)
Potassium: 3.4 mmol/L — ABNORMAL LOW (ref 3.5–5.1)
Sodium: 138 mmol/L (ref 135–145)
Total Bilirubin: 1 mg/dL (ref 0.0–1.2)
Total Protein: 5.9 g/dL — ABNORMAL LOW (ref 6.5–8.1)

## 2024-02-28 LAB — CBC WITH DIFFERENTIAL/PLATELET
Abs Immature Granulocytes: 0.04 10*3/uL (ref 0.00–0.07)
Basophils Absolute: 0 10*3/uL (ref 0.0–0.1)
Basophils Relative: 0 %
Eosinophils Absolute: 0.1 10*3/uL (ref 0.0–0.5)
Eosinophils Relative: 2 %
HCT: 30.7 % — ABNORMAL LOW (ref 36.0–46.0)
Hemoglobin: 10 g/dL — ABNORMAL LOW (ref 12.0–15.0)
Immature Granulocytes: 1 %
Lymphocytes Relative: 15 %
Lymphs Abs: 1.3 10*3/uL (ref 0.7–4.0)
MCH: 26.7 pg (ref 26.0–34.0)
MCHC: 32.6 g/dL (ref 30.0–36.0)
MCV: 81.9 fL (ref 80.0–100.0)
Monocytes Absolute: 0.5 10*3/uL (ref 0.1–1.0)
Monocytes Relative: 5 %
Neutro Abs: 6.7 10*3/uL (ref 1.7–7.7)
Neutrophils Relative %: 77 %
Platelets: 219 10*3/uL (ref 150–400)
RBC: 3.75 MIL/uL — ABNORMAL LOW (ref 3.87–5.11)
RDW: 14.6 % (ref 11.5–15.5)
WBC: 8.6 10*3/uL (ref 4.0–10.5)
nRBC: 0 % (ref 0.0–0.2)

## 2024-02-28 LAB — URINALYSIS, ROUTINE W REFLEX MICROSCOPIC
Bilirubin Urine: NEGATIVE
Glucose, UA: NEGATIVE mg/dL
Hgb urine dipstick: NEGATIVE
Ketones, ur: 5 mg/dL — AB
Leukocytes,Ua: NEGATIVE
Nitrite: NEGATIVE
Protein, ur: NEGATIVE mg/dL
Specific Gravity, Urine: 1.033 — ABNORMAL HIGH (ref 1.005–1.030)
pH: 6 (ref 5.0–8.0)

## 2024-02-28 LAB — PREGNANCY, URINE: Preg Test, Ur: NEGATIVE

## 2024-02-28 LAB — I-STAT CG4 LACTIC ACID, ED: Lactic Acid, Venous: 0.6 mmol/L (ref 0.5–1.9)

## 2024-02-28 MED ORDER — DIPHENHYDRAMINE HCL 50 MG/ML IJ SOLN
25.0000 mg | Freq: Once | INTRAMUSCULAR | Status: AC
  Administered 2024-02-28: 25 mg via INTRAVENOUS
  Filled 2024-02-28: qty 1

## 2024-02-28 MED ORDER — MELATONIN 3 MG PO TABS: 3.0000 mg | ORAL_TABLET | Freq: Every evening | ORAL | Status: DC | PRN

## 2024-02-28 MED ORDER — ACETAMINOPHEN 500 MG PO TABS
1000.0000 mg | ORAL_TABLET | Freq: Once | ORAL | Status: AC
  Administered 2024-02-28: 1000 mg via ORAL
  Filled 2024-02-28: qty 2

## 2024-02-28 MED ORDER — OXYCODONE HCL 5 MG PO TABS: 5.0000 mg | ORAL_TABLET | ORAL | Status: DC | PRN

## 2024-02-28 MED ORDER — PIPERACILLIN-TAZOBACTAM 3.375 G IVPB
3.3750 g | Freq: Three times a day (TID) | INTRAVENOUS | Status: DC
  Administered 2024-02-28 – 2024-03-01 (×7): 3.375 g via INTRAVENOUS
  Filled 2024-02-28 (×7): qty 50

## 2024-02-28 MED ORDER — POTASSIUM CHLORIDE CRYS ER 20 MEQ PO TBCR
40.0000 meq | EXTENDED_RELEASE_TABLET | Freq: Once | ORAL | Status: AC
  Administered 2024-02-28: 40 meq via ORAL
  Filled 2024-02-28: qty 2

## 2024-02-28 MED ORDER — METRONIDAZOLE 500 MG/100ML IV SOLN: 500.0000 mg | Freq: Two times a day (BID) | INTRAVENOUS | Status: DC

## 2024-02-28 MED ORDER — ONDANSETRON 4 MG PO TBDP
4.0000 mg | ORAL_TABLET | Freq: Four times a day (QID) | ORAL | Status: DC | PRN
Start: 2024-02-28 — End: 2024-03-01

## 2024-02-28 MED ORDER — ACETAMINOPHEN 500 MG PO TABS
1000.0000 mg | ORAL_TABLET | Freq: Four times a day (QID) | ORAL | Status: DC
  Administered 2024-02-28 – 2024-02-29 (×4): 1000 mg via ORAL
  Filled 2024-02-28 (×6): qty 2

## 2024-02-28 MED ORDER — SIMETHICONE 80 MG PO CHEW
80.0000 mg | CHEWABLE_TABLET | Freq: Four times a day (QID) | ORAL | Status: DC | PRN
Start: 2024-02-28 — End: 2024-03-01
  Administered 2024-02-28: 80 mg via ORAL
  Filled 2024-02-28: qty 1

## 2024-02-28 MED ORDER — POLYETHYLENE GLYCOL 3350 17 G PO PACK
17.0000 g | PACK | Freq: Two times a day (BID) | ORAL | Status: DC
  Administered 2024-02-29: 17 g via ORAL
  Filled 2024-02-28 (×2): qty 1

## 2024-02-28 MED ORDER — HYDRALAZINE HCL 20 MG/ML IJ SOLN: 10.0000 mg | INTRAMUSCULAR | Status: DC | PRN

## 2024-02-28 MED ORDER — DOCUSATE SODIUM 100 MG PO CAPS
100.0000 mg | ORAL_CAPSULE | Freq: Two times a day (BID) | ORAL | Status: DC
  Administered 2024-02-29 – 2024-03-01 (×2): 100 mg via ORAL
  Filled 2024-02-28 (×3): qty 1

## 2024-02-28 MED ORDER — ONDANSETRON HCL 4 MG/2ML IJ SOLN: 4.0000 mg | Freq: Four times a day (QID) | INTRAMUSCULAR | Status: DC | PRN

## 2024-02-28 MED ORDER — ENOXAPARIN SODIUM 40 MG/0.4ML IJ SOSY
40.0000 mg | PREFILLED_SYRINGE | INTRAMUSCULAR | Status: DC
  Administered 2024-02-28 – 2024-02-29 (×2): 40 mg via SUBCUTANEOUS
  Filled 2024-02-28 (×2): qty 0.4

## 2024-02-28 MED ORDER — IOHEXOL 350 MG/ML SOLN
75.0000 mL | Freq: Once | INTRAVENOUS | Status: AC | PRN
  Administered 2024-02-28: 75 mL via INTRAVENOUS

## 2024-02-28 MED ORDER — DIPHENHYDRAMINE HCL 50 MG/ML IJ SOLN: 25.0000 mg | Freq: Four times a day (QID) | INTRAMUSCULAR | Status: DC | PRN

## 2024-02-28 MED ORDER — SODIUM CHLORIDE 0.9 % IV SOLN: 2.0000 g | INTRAVENOUS | Status: DC

## 2024-02-28 MED ORDER — DIPHENHYDRAMINE HCL 25 MG PO CAPS: 25.0000 mg | ORAL_CAPSULE | Freq: Four times a day (QID) | ORAL | Status: DC | PRN

## 2024-02-28 NOTE — Sepsis Progress Note (Signed)
 Following for sepsis monitoring ?

## 2024-02-28 NOTE — ED Notes (Signed)
 Patient c/o itching, red whelps noted to back. Patient scratching all over. MD notified, new orders placed.

## 2024-02-28 NOTE — Plan of Care (Signed)
   Problem: Education: Goal: Knowledge of General Education information will improve Description: Including pain rating scale, medication(s)/side effects and non-pharmacologic comfort measures Outcome: Progressing   Problem: Clinical Measurements: Goal: Will remain free from infection Outcome: Progressing Goal: Diagnostic test results will improve Outcome: Progressing

## 2024-02-28 NOTE — Sepsis Progress Note (Signed)
 Verified with bedside nurse that blood cultures were drawn prior to antibiotic administration.

## 2024-02-28 NOTE — ED Provider Notes (Signed)
 [facility]PT [facility] Emergency Department Provider Note MRN:  [id number]  Arrival date & time: 02/28/24     Chief Complaint   Post-op Problem   History of Present Illness   Catherine Park is a 28 y.o. year-old female presents to the ED with chief complaint of fever and chills, and worsening abdominal pain.  Patient reports having appendectomy 3 days ago.  She states that when she was discharged, she felt well for the next 2 days.  States that today she began running fevers and chills and having increasing lower abdominal pain, more so on the left than on the right.  She states that she has been taking antibiotics as prescribed.  She has also taken her scheduled oxycodone.  She reports elevated fever at home.  Noted to be febrile to 102 in triage and tachycardic to 124.  She does have some residual tenderness to palpation.  Surgical wounds look good.  She denies any productive cough.  Denies dysuria.  She does not think that she had a catheter placed.Marland Kitchen  History provided by patient.   Review of Systems  Pertinent positive and negative review of systems noted in HPI.    Physical Exam   Vitals:   02/28/24 0345 02/28/24 0400  BP:  113/83  Pulse: 92 92  Resp:    Temp:    SpO2: 95% 95%    CONSTITUTIONAL:  non toxic-appearing, NAD NEURO:  Alert and oriented x 3, CN 3-12 grossly intact EYES:  eyes equal and reactive ENT/NECK:  Supple, no stridor  CARDIO:  tachycardic, regular rhythm, appears well-perfused  PULM:  No respiratory distress, CTAB GI/GU:  non-distended, mild generalized discomfort, post op wounds are CDI MSK/SPINE:  No gross deformities, no edema, moves all extremities  SKIN:  no rash, atraumatic   *Additional and/or pertinent findings included in MDM below  Diagnostic and Interventional Summary    EKG Interpretation Date/Time:    Ventricular Rate:    PR Interval:    QRS Duration:    QT Interval:    QTC Calculation:   R Axis:      Text  Interpretation:         Labs Reviewed  URINALYSIS, ROUTINE W REFLEX MICROSCOPIC - Abnormal; Notable for the following components:      Result Value   Specific Gravity, Urine 1.033 (*)    Ketones, ur 5 (*)    Bacteria, UA RARE (*)    All other components within normal limits  CBC WITH DIFFERENTIAL/PLATELET - Abnormal; Notable for the following components:   WBC 11.8 (*)    Neutro Abs 9.9 (*)    All other components within normal limits  COMPREHENSIVE METABOLIC PANEL - Abnormal; Notable for the following components:   Potassium 3.4 (*)    Glucose, Bld 119 (*)    Alkaline Phosphatase 140 (*)    Total Bilirubin 1.3 (*)    Anion gap 17 (*)    All other components within normal limits  CBC WITH DIFFERENTIAL/PLATELET - Abnormal; Notable for the following components:   RBC 3.75 (*)    Hemoglobin 10.0 (*)    HCT 30.7 (*)    All other components within normal limits  COMPREHENSIVE METABOLIC PANEL - Abnormal; Notable for the following components:   Potassium 3.4 (*)    CO2 21 (*)    Glucose, Bld 101 (*)    Calcium 8.1 (*)    Total Protein 5.9 (*)    Albumin 2.6 (*)    All other  components within normal limits  RESP PANEL BY RT-PCR (RSV, FLU A&B, COVID)  RVPGX2  CULTURE, BLOOD (SINGLE)  PREGNANCY, URINE  I-STAT CG4 LACTIC ACID, ED    CT ABDOMEN PELVIS W CONTRAST  Final Result    DG Chest Port 1 View  Final Result      Medications  lactated ringers infusion ( Intravenous New Bag/Given 02/28/24 0519)  acetaminophen (TYLENOL) tablet 650 mg (650 mg Oral Given 02/27/24 2246)  lactated ringers bolus 1,000 mL (0 mLs Intravenous Stopped 02/28/24 0151)    And  lactated ringers bolus 1,000 mL (0 mLs Intravenous Stopped 02/28/24 0309)    And  lactated ringers bolus 1,000 mL (0 mLs Intravenous Stopped 02/28/24 0411)  ceFEPIme (MAXIPIME) 2 g in sodium chloride 0.9 % 100 mL IVPB (0 g Intravenous Stopped 02/28/24 0053)  metroNIDAZOLE (FLAGYL) IVPB 500 mg (0 mg Intravenous Stopped 02/28/24 0208)   vancomycin (VANCOREADY) IVPB 2000 mg/400 mL (0 mg Intravenous Stopped 02/28/24 0254)  iohexol (OMNIPAQUE) 350 MG/ML injection 75 mL (75 mLs Intravenous Contrast Given 02/28/24 0039)  acetaminophen (TYLENOL) tablet 1,000 mg (1,000 mg Oral Given 02/28/24 0246)  diphenhydrAMINE (BENADRYL) injection 25 mg (25 mg Intravenous Given 02/28/24 0246)     Procedures  /  Critical Care Procedures  ED Course and Medical Decision Making  I have reviewed the triage vital signs, the nursing notes, and pertinent available records from the EMR.  Social Determinants Affecting Complexity of Care: Patient has no clinically significant social determinants affecting this chief complaint..   ED Course:    Medical Decision Making Patient here with gradually worsening abdominal pain that started yesterday.  She had appendectomy 3 days ago.  She was also noted to be febrile to 102 and tachycardic.  She denies dysuria or cough.  Chest x-ray and UA are not consistent with infection.  Initial labs notable for leukocytosis of 11.8.    Patient given fluids and broad-spectrum antibiotics.  CT shows no fluid collection.  There is some presumed normal postoperative free air.  Patient does have some persistent tenderness on exam.  I consulted with Dr. Andrey Campanile, who saw the patient and recommends that the patient continue with fluids and observation the ED.  Recommends repeat evaluation by the morning surgery team.  Patient to be seen by morning team.  Amount and/or Complexity of Data Reviewed Labs: ordered. Radiology: ordered.  Risk OTC drugs. Prescription drug management.         Consultants: I consulted with Dr. Andrey Campanile, who recommends that we continue with fluids and observe until morning and he will have the day team round on patient.   Treatment and Plan: Patient signed out at shift change.   Plan: General surgery to round on patient and offer formal recs.  Dispo per general surgery.  Patient seen by  and discussed with attending physician, Dr. Bebe Shaggy, who agrees with plan for general surgery consult.  Final Clinical Impressions(s) / ED Diagnoses     ICD-10-CM   1. Post-operative pain  G89.18     2. Post-procedural fever  R50.82       ED Discharge Orders     None         Discharge Instructions Discussed with and Provided to Patient:   Discharge Instructions   None      Roxy Horseman, PA-C 02/28/24 0600    Zadie Rhine, MD 02/28/24 [phone]

## 2024-02-28 NOTE — H&P (Signed)
 Subjective: CC: Known to our service.   She reports doing well on day of discharge. Yesterday she had return of RLQ abdominal pain that felt like pressure. Was still tolerating diet but didn't have an appetite. No n/v. Passing flatus. No BM prior to arrival. Reports fever to 102. Was voiding without issues. No CP, sob, cough but felt like she had "phlegm in her chest.   Since presentation she has been able to have a liquid BM and now reports her RLQ pain is greatly improved. Denies n/v.   Fever and tachycardia resolved. Last BP 105/70. WBC 8.6 (11.8). LFT's normalized. K 3.4. Hgb 10 (12).  Objective: Vital signs in last 24 hours: Temp:  [98.3 F (36.8 C)-102 F (38.9 C)] 98.3 F (36.8 C) (03/07 0649) Pulse Rate:  [92-124] 97 (03/07 0700) Resp:  [16-18] 17 (03/07 0649) BP: (102-137)/(53-91) 105/70 (03/07 0700) SpO2:  [95 %-100 %] 96 % (03/07 0700) Weight:  [85.7 kg] 85.7 kg (03/06 1925)    Intake/Output from previous day: No intake/output data recorded. Intake/Output this shift: No intake/output data recorded.  PE: Gen:  Alert, NAD, pleasant Card:  Reg Pulm:  CTAB, no W/R/R, effort normal Abd: Soft, no distension, mild RLQ ttp and appropriately tender around laparoscopic incisions, no rigidity or guarding and otherwise NT, +BS. Incisions with glue intact appears well and are without drainage, bleeding, or signs of infection  Ext:  No LE edema or calf ttp Psych: A&Ox3   Lab Results:  Recent Labs    02/27/24 2243 02/28/24 0428  WBC 11.8* 8.6  HGB 12.0 10.0*  HCT 37.0 30.7*  PLT 271 219   BMET Recent Labs    02/27/24 2243 02/28/24 0428  NA 142 138  K 3.4* 3.4*  CL 102 106  CO2 23 21*  GLUCOSE 119* 101*  BUN 8 6  CREATININE 0.85 0.67  CALCIUM 9.2 8.1*   PT/INR No results for input(s): "LABPROT", "INR" in the last 72 hours. CMP     Component Value Date/Time   NA 138 02/28/2024 0428   K 3.4 (L) 02/28/2024 0428   CL 106 02/28/2024 0428   CO2 21  (L) 02/28/2024 0428   GLUCOSE 101 (H) 02/28/2024 0428   BUN 6 02/28/2024 0428   CREATININE 0.67 02/28/2024 0428   CREATININE 0.59 04/01/2014 0847   CALCIUM 8.1 (L) 02/28/2024 0428   PROT 5.9 (L) 02/28/2024 0428   ALBUMIN 2.6 (L) 02/28/2024 0428   AST 17 02/28/2024 0428   ALT 16 02/28/2024 0428   ALKPHOS 104 02/28/2024 0428   BILITOT 1.0 02/28/2024 0428   GFRNONAA >60 02/28/2024 0428   Lipase     Component Value Date/Time   LIPASE 32 02/25/2024 0956    Studies/Results: CT ABDOMEN PELVIS W CONTRAST Result Date: 02/28/2024 CLINICAL DATA:  History of recent appendectomy with fever and right lower quadrant pain, initial encounter EXAM: CT ABDOMEN AND PELVIS WITH CONTRAST TECHNIQUE: Multidetector CT imaging of the abdomen and pelvis was performed using the standard protocol following bolus administration of intravenous contrast. RADIATION DOSE REDUCTION: This exam was performed according to the departmental dose-optimization program which includes automated exposure control, adjustment of the mA and/or kV according to patient size and/or use of iterative reconstruction technique. CONTRAST:  75mL OMNIPAQUE IOHEXOL 350 MG/ML SOLN COMPARISON:  02/24/2025 FINDINGS: Lower chest: Mild bibasilar atelectatic changes are seen. Hepatobiliary: Dependent density within the gallbladder which may simply represent gallbladder sludge. Multiple small poorly calcified stones could not be  totally excluded. The liver is within normal limits. Pancreas: Unremarkable. No pancreatic ductal dilatation or surrounding inflammatory changes. Spleen: Normal in size without focal abnormality. Adrenals/Urinary Tract: Bladder is partially distended. No obstructive changes of the kidneys are seen. No renal calculi are noted. Stomach/Bowel: Fecal material is noted throughout the colon. Changes consistent with prior appendectomy are noted. Minimal inflammatory changes are noted in the surgical bed consistent with the recent  postoperative state. Air is noted in the subcutaneous tissues as well as within the peritoneal cavity and abdominal musculature consistent with the prior laparoscopic procedure. Stomach is unremarkable. The small-bowel shows no acute abnormality. Vascular/Lymphatic: No significant vascular findings are present. No enlarged abdominal or pelvic lymph nodes. Reproductive: Uterus and bilateral adnexa are unremarkable. Other: No abdominal wall hernia or abnormality. No abdominopelvic ascites. Musculoskeletal: No acute or significant osseous findings. IMPRESSION: Postsurgical changes consistent with the recent appendectomy. The degree of free air is within normal limits for the recent surgery. Dependent density within the gallbladder which may represent gallbladder sludge or tiny a calcified stones. No new acute abnormality noted. Electronically Signed   By: Alcide Clever M.D.   On: 02/28/2024 01:01   DG Chest Port 1 View Result Date: 02/28/2024 CLINICAL DATA:  History of recent appendectomy with postop fever, initial encounter EXAM: PORTABLE CHEST 1 VIEW COMPARISON:  None Available. FINDINGS: Cardiac shadow is within normal limits. Lungs are well aerated bilaterally. Minimal free air is noted under the right hemidiaphragm consistent with the recent surgical history. No focal infiltrate or effusion is noted. IMPRESSION: No acute abnormality noted. Electronically Signed   By: Alcide Clever M.D.   On: 02/28/2024 00:54    Anti-infectives: Anti-infectives (From admission, onward)    Start     Dose/Rate Route Frequency Ordered Stop   02/28/24 0000  ceFEPIme (MAXIPIME) 2 g in sodium chloride 0.9 % 100 mL IVPB        2 g 200 mL/hr over 30 Minutes Intravenous  Once 02/27/24 2351 02/28/24 0053   02/28/24 0000  metroNIDAZOLE (FLAGYL) IVPB 500 mg        500 mg 100 mL/hr over 60 Minutes Intravenous  Once 02/27/24 2351 02/28/24 0208   02/28/24 0000  vancomycin (VANCOCIN) IVPB 1000 mg/200 mL premix  Status:  Discontinued         1,000 mg 200 mL/hr over 60 Minutes Intravenous  Once 02/27/24 2351 02/27/24 2356   02/28/24 0000  vancomycin (VANCOREADY) IVPB 2000 mg/400 mL        2,000 mg 200 mL/hr over 120 Minutes Intravenous  Once 02/27/24 2356 02/28/24 0254        Assessment/Plan POD 3 s/p laparoscopic appendectomy by Dr. Dossie Der for acute appendicits on 02/25/24 for perforated appendicitis - D/c on Augmentin. Returned with fever. CT with post operative changes without obvious abscess or other post operative complication. Admit for observation on IV abx. Bcx has been sent.  - Diet as tolerated.  - AM labs - Mobilize - Pulm toilet   FEN - Reg, replace hypokalemia (K 3.4). Bowel regimen.  VTE - SCDs, Lovenox ID - Cefepime/Flagyl/Vanc in ED. Zosyn >> Fever and leukocytosis resolved.    She has 85 week old daughter. She reports she has her breast pump with her and I asked RN to get her a cooler. Will talk with pharmacy if she needs to pump and dump with any of the medications she is on.    LOS: 0 days    Jacinto Halim, PA-C [facility] 02/28/2024, 8:23 AM Please see Amion for pager number during day hours 7:00am-4:30pm

## 2024-02-28 NOTE — Progress Notes (Addendum)
 Patient ID: Catherine Park, female   DOB: 1996-04-09, 28 y.o.   MRN: [id number]   Acute Care Surgery Service Progress Note:    Chief Complaint/Subjective:  Patient underwent laparoscopic appendectomy on March 4.  She was discharged on March 5 and continued on Augmentin.  She states that she was doing well until around 4:00 on March 6.  She had return of lower abdominal pain.  She tells me that it was right-sided but she told the ER staff that she had left sided abdominal pain.  She also complained of fever and chills.  No nausea or vomiting.  No dysuria.  Has not had a bowel movement since surgery.  Because of the fever and chills and return of abdominal pain she came back to the emergency room.  She was found to have a fever of 102 and tachycardia and met SIRS criteria.  She was started on antibiotics and a CT abdomen pelvis was performed.  Her CT was unremarkable but I was called to evaluate the patient because of her persistent complaints of lower abdominal discomfort and tachycardia   Objective: Vital signs in last 24 hours: Temp:  [98.8 F (37.1 C)-102 F (38.9 C)] 98.8 F (37.1 C) (03/07 0248) Pulse Rate:  [92-124] 92 (03/07 0345) Resp:  [16-18] 16 (03/07 0248) BP: (104-137)/(61-91) 107/70 (03/07 0300) SpO2:  [95 %-100 %] 95 % (03/07 0345) Weight:  [85.7 kg] 85.7 kg (03/06 1925)   Currently afebrile and not tachycardic Intake/Output from previous day: No intake/output data recorded. Intake/Output this shift: No intake/output data recorded.  Lungs: cta, nonlabored  Cardiovascular: reg  Abd: Obese, soft, tender to palpation in right mid abdomen to lower abdomen, no rebound or peritonitis.  Mild tenderness around left lower quadrant incision.  No cellulitis.  Extremities: no edema, +SCDs  Neuro: alert, nonfocal  Gen: Not terribly ill-appearing  Lab Results: CBC  Recent Labs    02/26/24 0640 02/27/24 2243  WBC 14.8* 11.8*  HGB 12.0 12.0  HCT 36.5 37.0  PLT 251  271   BMET Recent Labs    02/26/24 0640 02/27/24 2243  NA 133* 142  K 3.5 3.4*  CL 103 102  CO2 23 23  GLUCOSE 122* 119*  BUN 8 8  CREATININE 0.79 0.85  CALCIUM 8.1* 9.2   LFT    Latest Ref Rng & Units 02/27/2024   10:43 PM 02/25/2024    9:56 AM 04/01/2014    8:47 AM  Hepatic Function  Total Protein 6.5 - 8.1 g/dL 8.0  8.0  7.7   Albumin 3.5 - 5.0 g/dL 3.5  4.1  4.8   AST 15 - 41 U/L 29  20  16    ALT 0 - 44 U/L 22  16  9    Alk Phosphatase 38 - 126 U/L 140  98  73   Total Bilirubin 0.0 - 1.2 mg/dL 1.3  0.6  0.5    PT/INR No results for input(s): "LABPROT", "INR" in the last 72 hours. ABG No results for input(s): "PHART", "HCO3" in the last 72 hours.  Invalid input(s): "PCO2", "PO2"  Studies/Results:  Anti-infectives: Anti-infectives (From admission, onward)    Start     Dose/Rate Route Frequency Ordered Stop   02/28/24 0000  ceFEPIme (MAXIPIME) 2 g in sodium chloride 0.9 % 100 mL IVPB        2 g 200 mL/hr over 30 Minutes Intravenous  Once 02/27/24 2351 02/28/24 0053   02/28/24 0000  metroNIDAZOLE (FLAGYL) IVPB 500 mg  500 mg 100 mL/hr over 60 Minutes Intravenous  Once 02/27/24 2351 02/28/24 0208   02/28/24 0000  vancomycin (VANCOCIN) IVPB 1000 mg/200 mL premix  Status:  Discontinued        1,000 mg 200 mL/hr over 60 Minutes Intravenous  Once 02/27/24 2351 02/27/24 2356   02/28/24 0000  vancomycin (VANCOREADY) IVPB 2000 mg/400 mL        2,000 mg 200 mL/hr over 120 Minutes Intravenous  Once 02/27/24 2356 02/28/24 0254       Medications: Scheduled Meds: Continuous Infusions:  lactated ringers     PRN Meds:.  Assessment/Plan: Patient Active Problem List   Diagnosis Date Noted   Acute appendicitis 02/25/2024   Status post laparoscopic appendectomy March 3/4 by Dr. Dossie Der  Postoperative lower abdominal pain right greater than left Fever Tachycardia-improved Mild elevation of T. bili and alkaline phosphatase  I reviewed her CT.  There is no  intra-abdominal free air.  There is no signs of a postoperative abscess.  There is some soft tissue subcu air which is not uncommon.  There is no free fluid in the abdomen.  There is no signs of perforation.  I think the tenderness in the left lower quadrant is due to the extraction site which has a suture and deep in the muscle.  Not sure what to make of the fever.  She was tested for respiratory viruses which was negative.  She does have a little bit of stool burden in her colon. Gb not impressive on ct  I think she can probably be discharged and finish her oral antibiotic as an outpatient, bowel regimen  We will repeat her labs in a few hours and have the day team reassess her if she remains afebrile and improved pulse I think she can go home Disposition:  LOS: 0 days    Mary Sella. Andrey Campanile, MD, FACS General, Bariatric, & Minimally Invasive Surgery [phone] [facility]

## 2024-02-28 NOTE — ED Provider Notes (Signed)
  Physical Exam  BP (!) 102/53 (BP Location: Right Arm)   Pulse 92   Temp 98.3 F (36.8 C) (Oral)   Resp 17   Ht 1.575 m (5\' 2" )   Wt 85.7 kg   LMP  (LMP Unknown)   SpO2 99%   BMI 34.57 kg/m   Physical Exam  Procedures  Procedures  ED Course / MDM    Medical Decision Making Amount and/or Complexity of Data Reviewed Labs: ordered. Radiology: ordered.  Risk OTC drugs. Prescription drug management.   28 yo post appendectomy 3 days ago.  Repeat CT with expected post op free air. Received broad spectrum abx Dr. Andrey Campanile saw and surgery to see today.       Margarita Grizzle, MD 02/28/24 [phone]

## 2024-02-28 NOTE — Progress Notes (Signed)
 Patient arrived to room. Alert and oriented x4. Ambulated independently to the bathroom.  Patient inquired if her 17 month old and 33.28 year old children could come visit her in her room. I went to the director, Raymond Gurney, to confirm the visitation policy of no one 12 or under allowed to visit.  Raymond Gurney stated that no children are allowed to the floor to visit but that we could request an order from the doctor to leave the floor and if staff was available we could take the patient to the lobby to visit her children.  Patient also had questions about breast feeding and pumping. She brought her own breast pump and supplies. Ambar, patient's nurse, secure chatted the doctor to confirm that patient could pump for the baby and Ambar is going to confirm with the pharmacy that patient can breast feed while taking her medications.

## 2024-02-29 DIAGNOSIS — N3289 Other specified disorders of bladder: Secondary | ICD-10-CM | POA: Diagnosis not present

## 2024-02-29 DIAGNOSIS — Z883 Allergy status to other anti-infective agents status: Secondary | ICD-10-CM | POA: Diagnosis not present

## 2024-02-29 DIAGNOSIS — A419 Sepsis, unspecified organism: Secondary | ICD-10-CM | POA: Diagnosis present

## 2024-02-29 DIAGNOSIS — G8918 Other acute postprocedural pain: Secondary | ICD-10-CM | POA: Diagnosis present

## 2024-02-29 DIAGNOSIS — Z9049 Acquired absence of other specified parts of digestive tract: Secondary | ICD-10-CM | POA: Diagnosis not present

## 2024-02-29 DIAGNOSIS — R5082 Postprocedural fever: Secondary | ICD-10-CM | POA: Diagnosis present

## 2024-02-29 DIAGNOSIS — R109 Unspecified abdominal pain: Secondary | ICD-10-CM | POA: Diagnosis not present

## 2024-02-29 DIAGNOSIS — R509 Fever, unspecified: Secondary | ICD-10-CM | POA: Diagnosis not present

## 2024-02-29 DIAGNOSIS — E876 Hypokalemia: Secondary | ICD-10-CM | POA: Diagnosis present

## 2024-02-29 LAB — CBC
HCT: 31.8 % — ABNORMAL LOW (ref 36.0–46.0)
Hemoglobin: 9.9 g/dL — ABNORMAL LOW (ref 12.0–15.0)
MCH: 26.1 pg (ref 26.0–34.0)
MCHC: 31.1 g/dL (ref 30.0–36.0)
MCV: 83.9 fL (ref 80.0–100.0)
Platelets: 209 10*3/uL (ref 150–400)
RBC: 3.79 MIL/uL — ABNORMAL LOW (ref 3.87–5.11)
RDW: 15.1 % (ref 11.5–15.5)
WBC: 7.5 10*3/uL (ref 4.0–10.5)
nRBC: 0 % (ref 0.0–0.2)

## 2024-02-29 LAB — BASIC METABOLIC PANEL
Anion gap: 9 (ref 5–15)
BUN: 6 mg/dL (ref 6–20)
CO2: 23 mmol/L (ref 22–32)
Calcium: 8.3 mg/dL — ABNORMAL LOW (ref 8.9–10.3)
Chloride: 107 mmol/L (ref 98–111)
Creatinine, Ser: 0.79 mg/dL (ref 0.44–1.00)
GFR, Estimated: >60 mL/min (ref >60)
Glucose, Bld: 97 mg/dL (ref 70–99)
Potassium: 3.5 mmol/L (ref 3.5–5.1)
Sodium: 139 mmol/L (ref 135–145)

## 2024-02-29 NOTE — ED Provider Notes (Signed)
 [facility]    CSN: [id number] Arrival date & time: 02/25/24  0801      History   Chief Complaint No chief complaint on file.   HPI Catherine Park is a 28 y.o. female.   Presenting today with 1 day history of initially periumbilical pain now localized to RLQ and severe 10/10 per patient. Also now having N/V, fever, chills. Denies diarrhea, new foods or medications, recent sick contacts, URI sxs, hx of chronic GI issues. So far not trying anything OTC for sxs. Is 5 weeks postpartum and breastfeeding.     Past Medical History:  Diagnosis Date   Hyperlipidemia    Medical history non-contributory     Patient Active Problem List   Diagnosis Date Noted   Status post surgery 02/28/2024   Acute appendicitis 02/25/2024    Past Surgical History:  Procedure Laterality Date   LAPAROSCOPIC APPENDECTOMY N/A 02/25/2024   Procedure: APPENDECTOMY, LAPAROSCOPIC;  Surgeon: Quentin Ore, MD;  Location: [facility] OR;  Service: General;  Laterality: N/A;   WISDOM TOOTH EXTRACTION      OB History     Gravida  2   Para  2   Term  2   Preterm  0   AB  0   Living  2      SAB  0   IAB  0   Ectopic  0   Multiple  0   Live Births  2            Home Medications    Prior to Admission medications   Medication Sig Start Date End Date Taking? Authorizing Provider  amoxicillin-clavulanate (AUGMENTIN) 875-125 MG tablet Take 1 tablet by mouth every 12 (twelve) hours for 4 days. 02/26/24 03/01/24  Stechschulte, Hyman Hopes, MD  oxyCODONE-acetaminophen (PERCOCET) 5-325 MG tablet Take 1 tablet by mouth every 4 (four) hours as needed for severe pain (pain score 7-10). 02/25/24 02/24/25  Stechschulte, Hyman Hopes, MD    Family History Family History  Problem Relation Age of Onset   Hyperlipidemia Mother     Social History Social History   Tobacco Use   Smoking status: Never   Smokeless tobacco: Never  Vaping Use   Vaping status: Never Used  Substance Use Topics    Alcohol use: No   Drug use: No     Allergies   Flagyl [metronidazole]   Review of Systems Review of Systems PER HPI  Physical Exam Triage Vital Signs ED Triage Vitals  Encounter Vitals Group     BP 02/25/24 0817 121/84     Systolic BP Percentile --      Diastolic BP Percentile --      Pulse Rate 02/25/24 0817 73     Resp 02/25/24 0817 16     Temp 02/25/24 0817 99 F (37.2 C)     Temp Source 02/25/24 0817 Oral     SpO2 02/25/24 0817 95 %     Weight --      Height --      Head Circumference --      Peak Flow --      Pain Score 02/25/24 0818 8     Pain Loc --      Pain Education --      Exclude from Growth Chart --    No data found.  Updated Vital Signs BP 121/84 (BP Location: Right Arm)   Pulse 73   Temp 99 F (37.2 C) (Oral)   Resp 16  SpO2 95%   Breastfeeding Yes   Visual Acuity Right Eye Distance:   Left Eye Distance:   Bilateral Distance:    Right Eye Near:   Left Eye Near:    Bilateral Near:     Physical Exam Vitals and nursing note reviewed.  Constitutional:      Appearance: She is ill-appearing.  HENT:     Head: Atraumatic.     Mouth/Throat:     Mouth: Mucous membranes are moist.  Eyes:     Extraocular Movements: Extraocular movements intact.     Conjunctiva/sclera: Conjunctivae normal.  Cardiovascular:     Rate and Rhythm: Normal rate and regular rhythm.     Heart sounds: Normal heart sounds.  Pulmonary:     Effort: Pulmonary effort is normal.     Breath sounds: Normal breath sounds.  Abdominal:     Comments: Bowel sounds mildly hyperactive. Severe RLQ ttp with guarding, positive Rosvings sign  Musculoskeletal:        General: Normal range of motion.     Cervical back: Normal range of motion and neck supple.  Skin:    General: Skin is warm and dry.  Neurological:     Mental Status: She is alert and oriented to person, place, and time.  Psychiatric:        Mood and Affect: Mood normal.        Thought Content: Thought content  normal.        Judgment: Judgment normal.      UC Treatments / Results  Labs (all labs ordered are listed, but only abnormal results are displayed) Labs Reviewed  POCT URINALYSIS DIP (MANUAL ENTRY)    EKG   Radiology CT ABDOMEN PELVIS W CONTRAST Result Date: 02/28/2024 CLINICAL DATA:  History of recent appendectomy with fever and right lower quadrant pain, initial encounter EXAM: CT ABDOMEN AND PELVIS WITH CONTRAST TECHNIQUE: Multidetector CT imaging of the abdomen and pelvis was performed using the standard protocol following bolus administration of intravenous contrast. RADIATION DOSE REDUCTION: This exam was performed according to the departmental dose-optimization program which includes automated exposure control, adjustment of the mA and/or kV according to patient size and/or use of iterative reconstruction technique. CONTRAST:  75mL OMNIPAQUE IOHEXOL 350 MG/ML SOLN COMPARISON:  02/24/2025 FINDINGS: Lower chest: Mild bibasilar atelectatic changes are seen. Hepatobiliary: Dependent density within the gallbladder which may simply represent gallbladder sludge. Multiple small poorly calcified stones could not be totally excluded. The liver is within normal limits. Pancreas: Unremarkable. No pancreatic ductal dilatation or surrounding inflammatory changes. Spleen: Normal in size without focal abnormality. Adrenals/Urinary Tract: Bladder is partially distended. No obstructive changes of the kidneys are seen. No renal calculi are noted. Stomach/Bowel: Fecal material is noted throughout the colon. Changes consistent with prior appendectomy are noted. Minimal inflammatory changes are noted in the surgical bed consistent with the recent postoperative state. Air is noted in the subcutaneous tissues as well as within the peritoneal cavity and abdominal musculature consistent with the prior laparoscopic procedure. Stomach is unremarkable. The small-bowel shows no acute abnormality. Vascular/Lymphatic: No  significant vascular findings are present. No enlarged abdominal or pelvic lymph nodes. Reproductive: Uterus and bilateral adnexa are unremarkable. Other: No abdominal wall hernia or abnormality. No abdominopelvic ascites. Musculoskeletal: No acute or significant osseous findings. IMPRESSION: Postsurgical changes consistent with the recent appendectomy. The degree of free air is within normal limits for the recent surgery. Dependent density within the gallbladder which may represent gallbladder sludge or tiny a calcified stones. No  new acute abnormality noted. Electronically Signed   By: Alcide Clever M.D.   On: 02/28/2024 01:01    Procedures Procedures (including critical care time)  Medications Ordered in UC Medications  ondansetron (ZOFRAN-ODT) disintegrating tablet 4 mg (4 mg Oral Given 02/25/24 0904)  acetaminophen (TYLENOL) tablet 1,000 mg (1,000 mg Oral Given 02/25/24 0904)    Initial Impression / Assessment and Plan / UC Course  I have reviewed the triage vital signs and the nursing notes.  Pertinent labs & imaging results that were available during my care of the patient were reviewed by me and considered in my medical decision making (see chart for details).     Vitals WNL, but she appears in mild distress and is in 10/10 abdominal pain with concerning features for appendicitis in addition to being newly postpartum. Discussed importance of further evaluation and management in the ED and she is agreeable at this time. Declines EMS, will call family member to drive her POV. Zofran and tylenol given prior to discharge.   Final Clinical Impressions(s) / UC Diagnoses   Final diagnoses:  RLQ abdominal pain  Nausea and vomiting, unspecified vomiting type     Discharge Instructions      Go to the emergency department for further evaluation of your severe abdominal pain.  We do not recommend that you drive yourself, recommend calling someone to drive you.  We have given you nausea  medication and Tylenol prior to discharge today.    ED Prescriptions   None    PDMP not reviewed this encounter.   Particia Nearing, [place] 02/29/24 2259

## 2024-02-29 NOTE — Plan of Care (Signed)

## 2024-02-29 NOTE — Plan of Care (Signed)

## 2024-02-29 NOTE — Progress Notes (Signed)
 Subjective/Chief Complaint: No complaints, afebrile   Objective: Vital signs in last 24 hours: Temp:  [97.9 F (36.6 C)-99.5 F (37.5 C)] 98.6 F (37 C) (03/08 0723) Pulse Rate:  [61-95] 73 (03/08 0723) Resp:  [16-20] 16 (03/08 0723) BP: (106-125)/(66-97) 117/79 (03/08 0723) SpO2:  [97 %-100 %] 98 % (03/08 0723) Last BM Date : 02/28/24  Intake/Output from previous day: 03/07 0701 - 03/08 0700 In: 360 [P.O.:360] Out: -  Intake/Output this shift: No intake/output data recorded.  Ab soft approp tender incisions clean  Lab Results:  Recent Labs    02/28/24 0428 02/29/24 0644  WBC 8.6 7.5  HGB 10.0* 9.9*  HCT 30.7* 31.8*  PLT 219 209   BMET Recent Labs    02/28/24 0428 02/29/24 0644  NA 138 139  K 3.4* 3.5  CL 106 107  CO2 21* 23  GLUCOSE 101* 97  BUN 6 6  CREATININE 0.67 0.79  CALCIUM 8.1* 8.3*   PT/INR No results for input(s): "LABPROT", "INR" in the last 72 hours. ABG No results for input(s): "PHART", "HCO3" in the last 72 hours.  Invalid input(s): "PCO2", "PO2"  Studies/Results: CT ABDOMEN PELVIS W CONTRAST Result Date: 02/28/2024 CLINICAL DATA:  History of recent appendectomy with fever and right lower quadrant pain, initial encounter EXAM: CT ABDOMEN AND PELVIS WITH CONTRAST TECHNIQUE: Multidetector CT imaging of the abdomen and pelvis was performed using the standard protocol following bolus administration of intravenous contrast. RADIATION DOSE REDUCTION: This exam was performed according to the departmental dose-optimization program which includes automated exposure control, adjustment of the mA and/or kV according to patient size and/or use of iterative reconstruction technique. CONTRAST:  75mL OMNIPAQUE IOHEXOL 350 MG/ML SOLN COMPARISON:  02/24/2025 FINDINGS: Lower chest: Mild bibasilar atelectatic changes are seen. Hepatobiliary: Dependent density within the gallbladder which may simply represent gallbladder sludge. Multiple small poorly calcified  stones could not be totally excluded. The liver is within normal limits. Pancreas: Unremarkable. No pancreatic ductal dilatation or surrounding inflammatory changes. Spleen: Normal in size without focal abnormality. Adrenals/Urinary Tract: Bladder is partially distended. No obstructive changes of the kidneys are seen. No renal calculi are noted. Stomach/Bowel: Fecal material is noted throughout the colon. Changes consistent with prior appendectomy are noted. Minimal inflammatory changes are noted in the surgical bed consistent with the recent postoperative state. Air is noted in the subcutaneous tissues as well as within the peritoneal cavity and abdominal musculature consistent with the prior laparoscopic procedure. Stomach is unremarkable. The small-bowel shows no acute abnormality. Vascular/Lymphatic: No significant vascular findings are present. No enlarged abdominal or pelvic lymph nodes. Reproductive: Uterus and bilateral adnexa are unremarkable. Other: No abdominal wall hernia or abnormality. No abdominopelvic ascites. Musculoskeletal: No acute or significant osseous findings. IMPRESSION: Postsurgical changes consistent with the recent appendectomy. The degree of free air is within normal limits for the recent surgery. Dependent density within the gallbladder which may represent gallbladder sludge or tiny a calcified stones. No new acute abnormality noted. Electronically Signed   By: Alcide Clever M.D.   On: 02/28/2024 01:01   DG Chest Port 1 View Result Date: 02/28/2024 CLINICAL DATA:  History of recent appendectomy with postop fever, initial encounter EXAM: PORTABLE CHEST 1 VIEW COMPARISON:  None Available. FINDINGS: Cardiac shadow is within normal limits. Lungs are well aerated bilaterally. Minimal free air is noted under the right hemidiaphragm consistent with the recent surgical history. No focal infiltrate or effusion is noted. IMPRESSION: No acute abnormality noted. Electronically Signed   By: Loraine Leriche  Lukens M.D.   On: 02/28/2024 00:54    Anti-infectives: Anti-infectives (From admission, onward)    Start     Dose/Rate Route Frequency Ordered Stop   02/28/24 0845  cefTRIAXone (ROCEPHIN) 2 g in sodium chloride 0.9 % 100 mL IVPB  Status:  Discontinued       Placed in "And" Linked Group   2 g 200 mL/hr over 30 Minutes Intravenous Every 24 hours 02/28/24 0832 02/28/24 0835   02/28/24 0845  metroNIDAZOLE (FLAGYL) IVPB 500 mg  Status:  Discontinued       Placed in "And" Linked Group   500 mg 100 mL/hr over 60 Minutes Intravenous Every 12 hours 02/28/24 0832 02/28/24 0835   02/28/24 0845  piperacillin-tazobactam (ZOSYN) IVPB 3.375 g        3.375 g 12.5 mL/hr over 240 Minutes Intravenous Every 8 hours 02/28/24 0835     02/28/24 0000  ceFEPIme (MAXIPIME) 2 g in sodium chloride 0.9 % 100 mL IVPB        2 g 200 mL/hr over 30 Minutes Intravenous  Once 02/27/24 2351 02/28/24 0053   02/28/24 0000  metroNIDAZOLE (FLAGYL) IVPB 500 mg        500 mg 100 mL/hr over 60 Minutes Intravenous  Once 02/27/24 2351 02/28/24 0208   02/28/24 0000  vancomycin (VANCOCIN) IVPB 1000 mg/200 mL premix  Status:  Discontinued        1,000 mg 200 mL/hr over 60 Minutes Intravenous  Once 02/27/24 2351 02/27/24 2356   02/28/24 0000  vancomycin (VANCOREADY) IVPB 2000 mg/400 mL        2,000 mg 200 mL/hr over 120 Minutes Intravenous  Once 02/27/24 2356 02/28/24 0254       Assessment/Plan: POD 4 s/p laparoscopic appendectomy by Dr. Dossie Der for acute appendicits on 02/25/24 for perforated appendicitis - D/c on Augmentin. Returned with fever. CT with post operative changes without obvious abscess or other post operative complication. Admit for observation on IV abx. Bcx has been sent.  - Diet as tolerated.  - wbc normal, afebrile, bcx neg to date -if bcx neg at 48 hours will dc home   FEN - Regular VTE - SCDs, Lovenox ID - Cefepime/Flagyl/Vanc in ED. Zosyn >> Fever and leukocytosis resolved.     Catherine Park 02/29/2024

## 2024-03-01 MED ORDER — AMOXICILLIN-POT CLAVULANATE 875-125 MG PO TABS: 1.0000 | ORAL_TABLET | Freq: Two times a day (BID) | ORAL | 0 refills | Status: AC

## 2024-03-01 NOTE — Discharge Instructions (Signed)

## 2024-03-01 NOTE — Discharge Summary (Signed)
 Physician Discharge Summary  Patient ID: Dwan Fennel MRN: [id number] DOB/AGE: September 21, 1996 28 y.o.  Admit date: 02/27/2024 Discharge date: 03/01/2024  Admission Diagnoses: S/p lap appy for perforated appendicitis Fever  Discharge Diagnoses:  Principal Problem:   Status post surgery   Discharged Condition: good  Hospital Course: 9 yof s/p lap appy 3/4 for locally perforated appendicitis. Placed on augmentin postop and discharged home.  She returned with fever and negative CT scan.  She has normalized wbc, afebrile, bcx negative at 48 hours. Plan for dc home  Consults: None  Significant Diagnostic Studies: radiology: CT scan: no abscess  Treatments: antibiotics    Disposition: Discharge disposition: 01-Home or Self Care        Allergies as of 03/01/2024       Reactions   Flagyl [metronidazole] Hives, Itching        Medication List     TAKE these medications    amoxicillin-clavulanate 875-125 MG tablet Commonly known as: AUGMENTIN Take 1 tablet by mouth every 12 (twelve) hours for 6 days.   oxyCODONE-acetaminophen 5-325 MG tablet Commonly known as: Percocet Take 1 tablet by mouth every 4 (four) hours as needed for severe pain (pain score 7-10).        Follow-up Information     [facility] Follow up.   Specialty: General Surgery Why: as already scheduled Contact information: [address] [place] [address] [phone]                Signed: Emelia Loron 03/01/2024, 10:38 AM

## 2024-03-01 NOTE — Progress Notes (Signed)
   Subjective/Chief Complaint: Doing well, would like to go home   Objective: Vital signs in last 24 hours: Temp:  [98 F (36.7 C)-99.9 F (37.7 C)] 98 F (36.7 C) (03/09 0801) Pulse Rate:  [76-91] 78 (03/09 0801) Resp:  [15-17] 16 (03/09 0801) BP: (113-127)/(68-92) 118/71 (03/09 0801) SpO2:  [96 %-99 %] 98 % (03/09 0801) Last BM Date : 02/29/24  Intake/Output from previous day: 03/08 0701 - 03/09 0700 In: 689 [P.O.:480; IV Piggyback:209] Out: -  Intake/Output this shift: No intake/output data recorded.  Ab soft   Lab Results:  Recent Labs    02/28/24 0428 02/29/24 0644  WBC 8.6 7.5  HGB 10.0* 9.9*  HCT 30.7* 31.8*  PLT 219 209   BMET Recent Labs    02/28/24 0428 02/29/24 0644  NA 138 139  K 3.4* 3.5  CL 106 107  CO2 21* 23  GLUCOSE 101* 97  BUN 6 6  CREATININE 0.67 0.79  CALCIUM 8.1* 8.3*   PT/INR No results for input(s): "LABPROT", "INR" in the last 72 hours. ABG No results for input(s): "PHART", "HCO3" in the last 72 hours.  Invalid input(s): "PCO2", "PO2"  Studies/Results: No results found.  Anti-infectives: Anti-infectives (From admission, onward)    Start     Dose/Rate Route Frequency Ordered Stop   02/28/24 0845  cefTRIAXone (ROCEPHIN) 2 g in sodium chloride 0.9 % 100 mL IVPB  Status:  Discontinued       Placed in "And" Linked Group   2 g 200 mL/hr over 30 Minutes Intravenous Every 24 hours 02/28/24 0832 02/28/24 0835   02/28/24 0845  metroNIDAZOLE (FLAGYL) IVPB 500 mg  Status:  Discontinued       Placed in "And" Linked Group   500 mg 100 mL/hr over 60 Minutes Intravenous Every 12 hours 02/28/24 0832 02/28/24 0835   02/28/24 0845  piperacillin-tazobactam (ZOSYN) IVPB 3.375 g        3.375 g 12.5 mL/hr over 240 Minutes Intravenous Every 8 hours 02/28/24 0835     02/28/24 0000  ceFEPIme (MAXIPIME) 2 g in sodium chloride 0.9 % 100 mL IVPB        2 g 200 mL/hr over 30 Minutes Intravenous  Once 02/27/24 2351 02/28/24 0053   02/28/24  0000  metroNIDAZOLE (FLAGYL) IVPB 500 mg        500 mg 100 mL/hr over 60 Minutes Intravenous  Once 02/27/24 2351 02/28/24 0208   02/28/24 0000  vancomycin (VANCOCIN) IVPB 1000 mg/200 mL premix  Status:  Discontinued        1,000 mg 200 mL/hr over 60 Minutes Intravenous  Once 02/27/24 2351 02/27/24 2356   02/28/24 0000  vancomycin (VANCOREADY) IVPB 2000 mg/400 mL        2,000 mg 200 mL/hr over 120 Minutes Intravenous  Once 02/27/24 2356 02/28/24 0254       Assessment/Plan: POD 5 s/p laparoscopic appendectomy by Dr. Dossie Der for acute appendicits on 02/25/24 for perforated appendicitis - D/c'd on Augmentin. Returned with fever. CT with post operative changes without obvious abscess or other post operative complication. Admit for observation on IV abx. Bcx has been sent.  - Diet as tolerated.  - wbc normal, afebrile, bcx neg to date -if bcx neg today will dc home   FEN - Regular VTE - SCDs, Lovenox ID - Cefepime/Flagyl/Vanc in ED. Zosyn >> Fever and leukocytosis resolved.    Emelia Loron 03/01/2024

## 2024-03-01 NOTE — Progress Notes (Signed)
 Patient verbalized understanding of dc instructions.all belongings given to patient.

## 2024-03-04 LAB — CULTURE, BLOOD (SINGLE)
Culture: NO GROWTH
Culture: NO GROWTH
Special Requests: ADEQUATE

## 2024-04-04 DIAGNOSIS — Z419 Encounter for procedure for purposes other than remedying health state, unspecified: Secondary | ICD-10-CM | POA: Diagnosis not present

## 2024-04-13 DIAGNOSIS — M5412 Radiculopathy, cervical region: Secondary | ICD-10-CM | POA: Diagnosis not present

## 2024-04-13 DIAGNOSIS — M9902 Segmental and somatic dysfunction of thoracic region: Secondary | ICD-10-CM | POA: Diagnosis not present

## 2024-04-13 DIAGNOSIS — M9903 Segmental and somatic dysfunction of lumbar region: Secondary | ICD-10-CM | POA: Diagnosis not present

## 2024-04-13 DIAGNOSIS — M62830 Muscle spasm of back: Secondary | ICD-10-CM | POA: Diagnosis not present

## 2024-04-13 DIAGNOSIS — M546 Pain in thoracic spine: Secondary | ICD-10-CM | POA: Diagnosis not present

## 2024-04-13 DIAGNOSIS — M9901 Segmental and somatic dysfunction of cervical region: Secondary | ICD-10-CM | POA: Diagnosis not present

## 2024-04-15 DIAGNOSIS — M9902 Segmental and somatic dysfunction of thoracic region: Secondary | ICD-10-CM | POA: Diagnosis not present

## 2024-04-15 DIAGNOSIS — M62830 Muscle spasm of back: Secondary | ICD-10-CM | POA: Diagnosis not present

## 2024-04-15 DIAGNOSIS — M9903 Segmental and somatic dysfunction of lumbar region: Secondary | ICD-10-CM | POA: Diagnosis not present

## 2024-04-15 DIAGNOSIS — M5412 Radiculopathy, cervical region: Secondary | ICD-10-CM | POA: Diagnosis not present

## 2024-04-15 DIAGNOSIS — M9901 Segmental and somatic dysfunction of cervical region: Secondary | ICD-10-CM | POA: Diagnosis not present

## 2024-04-15 DIAGNOSIS — M546 Pain in thoracic spine: Secondary | ICD-10-CM | POA: Diagnosis not present

## 2024-04-20 DIAGNOSIS — M9903 Segmental and somatic dysfunction of lumbar region: Secondary | ICD-10-CM | POA: Diagnosis not present

## 2024-04-20 DIAGNOSIS — M5412 Radiculopathy, cervical region: Secondary | ICD-10-CM | POA: Diagnosis not present

## 2024-04-20 DIAGNOSIS — M9901 Segmental and somatic dysfunction of cervical region: Secondary | ICD-10-CM | POA: Diagnosis not present

## 2024-04-20 DIAGNOSIS — M62830 Muscle spasm of back: Secondary | ICD-10-CM | POA: Diagnosis not present

## 2024-04-20 DIAGNOSIS — M9902 Segmental and somatic dysfunction of thoracic region: Secondary | ICD-10-CM | POA: Diagnosis not present

## 2024-04-20 DIAGNOSIS — M546 Pain in thoracic spine: Secondary | ICD-10-CM | POA: Diagnosis not present

## 2024-05-04 DIAGNOSIS — Z419 Encounter for procedure for purposes other than remedying health state, unspecified: Secondary | ICD-10-CM | POA: Diagnosis not present

## 2024-05-30 ENCOUNTER — Ambulatory Visit
Admission: EM | Admit: 2024-05-30 | Discharge: 2024-05-30 | Disposition: A | Discharge status: Home / Self Care | Attending: Nurse Practitioner | Admitting: Nurse Practitioner

## 2024-05-30 DIAGNOSIS — H6592 Unspecified nonsuppurative otitis media, left ear: Secondary | ICD-10-CM

## 2024-05-30 DIAGNOSIS — J069 Acute upper respiratory infection, unspecified: Secondary | ICD-10-CM | POA: Diagnosis not present

## 2024-05-30 LAB — POCT RAPID STREP A (OFFICE): Rapid Strep A Screen: NEGATIVE

## 2024-05-30 LAB — POC SARS CORONAVIRUS 2 AG -  ED: SARS Coronavirus 2 Ag: NEGATIVE

## 2024-05-30 MED ORDER — AMOXICILLIN 500 MG PO CAPS: 500.0000 mg | ORAL_CAPSULE | Freq: Two times a day (BID) | ORAL | 0 refills | Status: AC

## 2024-05-30 NOTE — Discharge Instructions (Signed)
 The rapid strep test and COVID test were negative. Take medication as prescribed. Increase fluids and allow for plenty of rest. You may continue over-the-counter Tylenol  as needed for pain, fever, or general discomfort. Recommend the use of normal saline nasal spray throughout the day for nasal congestion and runny nose. For your cough, recommend use of a humidifier in your bedroom at nighttime during sleep and sleeping elevated on pillows while symptoms persist. Recommend applying warm compresses to the affected ear to help with pain or discomfort.  Avoid getting water inside of the ear while symptoms persist. Follow-up with your primary care physician if symptoms fail to improve with this treatment. Follow-up as needed.

## 2024-05-30 NOTE — ED Triage Notes (Signed)
 Pt reports she has a cough, sore throat, and left ear pain x 4 days

## 2024-05-30 NOTE — ED Provider Notes (Signed)
 [facility]    CSN: [id number] Arrival date & time: 05/30/24  0840      History   Chief Complaint No chief complaint on file.   HPI Catherine Park is a 28 y.o. female.   The history is provided by the patient.   Patient presents with a 4-day history of cough, sore throat, and nasal congestion.  Patient states she developed left ear pain over the past 24 hours.  Denies fever, chills, headache, ear drainage, wheezing, difficulty breathing, shortness of breath, abdominal pain, nausea, vomiting, diarrhea, or rash.  Patient states she has been taking Tylenol  for her symptoms.  Reports that she is currently breast-feeding.  She denies any obvious known sick contacts.  Past Medical History:  Diagnosis Date   Hyperlipidemia    Medical history non-contributory     Patient Active Problem List   Diagnosis Date Noted   Status post surgery 02/28/2024   Acute appendicitis 02/25/2024    Past Surgical History:  Procedure Laterality Date   LAPAROSCOPIC APPENDECTOMY N/A 02/25/2024   Procedure: APPENDECTOMY, LAPAROSCOPIC;  Surgeon: Junie Olds, MD;  Location: [facility] OR;  Service: General;  Laterality: N/A;   WISDOM TOOTH EXTRACTION      OB History     Gravida  2   Para  2   Term  2   Preterm  0   AB  0   Living  2      SAB  0   IAB  0   Ectopic  0   Multiple  0   Live Births  2            Home Medications    Prior to Admission medications   Medication Sig Start Date End Date Taking? Authorizing Provider  amoxicillin  (AMOXIL ) 500 MG capsule Take 1 capsule (500 mg total) by mouth 2 (two) times daily for 10 days. 05/30/24 06/09/24 Yes Leath-Warren, Belen Bowers, NP  oxyCODONE -acetaminophen  (PERCOCET) 5-325 MG tablet Take 1 tablet by mouth every 4 (four) hours as needed for severe pain (pain score 7-10). 02/25/24 02/24/25  Stechschulte, Avon Boers, MD    Family History Family History  Problem Relation Age of Onset   Hyperlipidemia Mother      Social History Social History   Tobacco Use   Smoking status: Never   Smokeless tobacco: Never  Vaping Use   Vaping status: Never Used  Substance Use Topics   Alcohol use: No   Drug use: No     Allergies   Flagyl  [metronidazole ]   Review of Systems Review of Systems Per HPI  Physical Exam Triage Vital Signs ED Triage Vitals  Encounter Vitals Group     BP 05/30/24 0859 121/73     Systolic BP Percentile --      Diastolic BP Percentile --      Pulse Rate 05/30/24 0859 (!) 127     Resp 05/30/24 0859 20     Temp 05/30/24 0859 98 F (36.7 C)     Temp Source 05/30/24 0859 Oral     SpO2 05/30/24 0859 95 %     Weight --      Height --      Head Circumference --      Peak Flow --      Pain Score 05/30/24 0904 7     Pain Loc --      Pain Education --      Exclude from Growth Chart --    No data found.  Updated Vital Signs BP 121/73 (BP Location: Right Arm)   Pulse (!) 127   Temp 98 F (36.7 C) (Oral)   Resp 20   SpO2 95%   Breastfeeding Yes   Visual Acuity Right Eye Distance:   Left Eye Distance:   Bilateral Distance:    Right Eye Near:   Left Eye Near:    Bilateral Near:     Physical Exam Vitals and nursing note reviewed.  Constitutional:      General: She is not in acute distress.    Appearance: Normal appearance.  HENT:     Head: Normocephalic.     Right Ear: Tympanic membrane, ear canal and external ear normal.     Left Ear: Ear canal and external ear normal. Tympanic membrane is erythematous and bulging.     Nose: Congestion present.     Mouth/Throat:     Lips: Pink.     Mouth: Mucous membranes are moist.     Pharynx: Uvula midline. Posterior oropharyngeal erythema and postnasal drip present. No pharyngeal swelling, oropharyngeal exudate or uvula swelling.     Comments: Cobblestoning present to posterior oropharynx  Eyes:     Extraocular Movements: Extraocular movements intact.     Conjunctiva/sclera: Conjunctivae normal.     Pupils:  Pupils are equal, round, and reactive to light.  Cardiovascular:     Rate and Rhythm: Normal rate and regular rhythm.     Pulses: Normal pulses.     Heart sounds: Normal heart sounds.  Pulmonary:     Effort: Pulmonary effort is normal. No respiratory distress.     Breath sounds: Normal breath sounds. No stridor. No wheezing, rhonchi or rales.  Abdominal:     General: Bowel sounds are normal.     Palpations: Abdomen is soft.     Tenderness: There is no abdominal tenderness.  Musculoskeletal:     Cervical back: Normal range of motion.  Skin:    General: Skin is warm and dry.  Neurological:     General: No focal deficit present.     Mental Status: She is alert and oriented to person, place, and time.  Psychiatric:        Mood and Affect: Mood normal.        Behavior: Behavior normal.      UC Treatments / Results  Labs (all labs ordered are listed, but only abnormal results are displayed) Labs Reviewed  POCT RAPID STREP A (OFFICE) - Normal  POC SARS CORONAVIRUS 2 AG -  ED    EKG   Radiology No results found.  Procedures Procedures (including critical care time)  Medications Ordered in UC Medications - No data to display  Initial Impression / Assessment and Plan / UC Course  I have reviewed the triage vital signs and the nursing notes.  Pertinent labs & imaging results that were available during my care of the patient were reviewed by me and considered in my medical decision making (see chart for details).  The rapid strep test and COVID test were negative.  On exam, lung sounds are clear throughout, room air sats at 95%.  Patient with bulging and erythema of the left tympanic membrane, consistent with left otitis media with effusion.  Symptoms consistent with a viral URI with left otitis media.  Will treat with amoxicillin  500 mg twice daily for the next 10 days.  This is safe while patient is breast-feeding.  Supportive care recommendations were provided and discussed  with the patient to include  over-the-counter analgesics, fluids, rest, normal saline nasal spray, and use of a humidifier during sleep.  Discussed indications with patient regarding follow-up.  Patient was in agreement with this plan of care and verbalizes understanding.  All questions were answered.  Patient stable for discharge.  Final Clinical Impressions(s) / UC Diagnoses   Final diagnoses:  Left otitis media with effusion  Viral URI with cough     Discharge Instructions      The rapid strep test and COVID test were negative. Take medication as prescribed. Increase fluids and allow for plenty of rest. You may continue over-the-counter Tylenol  as needed for pain, fever, or general discomfort. Recommend the use of normal saline nasal spray throughout the day for nasal congestion and runny nose. For your cough, recommend use of a humidifier in your bedroom at nighttime during sleep and sleeping elevated on pillows while symptoms persist. Recommend applying warm compresses to the affected ear to help with pain or discomfort.  Avoid getting water inside of the ear while symptoms persist. Follow-up with your primary care physician if symptoms fail to improve with this treatment. Follow-up as needed.   ED Prescriptions     Medication Sig Dispense Auth. Provider   amoxicillin  (AMOXIL ) 500 MG capsule Take 1 capsule (500 mg total) by mouth 2 (two) times daily for 10 days. 20 capsule Leath-Warren, Belen Bowers, NP      PDMP not reviewed this encounter.   Hardy Lia, NP 05/30/24 [phone]

## 2024-06-04 DIAGNOSIS — Z419 Encounter for procedure for purposes other than remedying health state, unspecified: Secondary | ICD-10-CM | POA: Diagnosis not present

## 2024-07-04 DIAGNOSIS — Z419 Encounter for procedure for purposes other than remedying health state, unspecified: Secondary | ICD-10-CM | POA: Diagnosis not present

## 2024-07-08 DIAGNOSIS — M9903 Segmental and somatic dysfunction of lumbar region: Secondary | ICD-10-CM | POA: Diagnosis not present

## 2024-07-08 DIAGNOSIS — M546 Pain in thoracic spine: Secondary | ICD-10-CM | POA: Diagnosis not present

## 2024-07-08 DIAGNOSIS — M9905 Segmental and somatic dysfunction of pelvic region: Secondary | ICD-10-CM | POA: Diagnosis not present

## 2024-07-08 DIAGNOSIS — M62830 Muscle spasm of back: Secondary | ICD-10-CM | POA: Diagnosis not present

## 2024-07-08 DIAGNOSIS — M9902 Segmental and somatic dysfunction of thoracic region: Secondary | ICD-10-CM | POA: Diagnosis not present

## 2024-08-03 DIAGNOSIS — M5442 Lumbago with sciatica, left side: Secondary | ICD-10-CM | POA: Diagnosis not present

## 2024-08-03 DIAGNOSIS — M9903 Segmental and somatic dysfunction of lumbar region: Secondary | ICD-10-CM | POA: Diagnosis not present

## 2024-08-03 DIAGNOSIS — M9902 Segmental and somatic dysfunction of thoracic region: Secondary | ICD-10-CM | POA: Diagnosis not present

## 2024-08-03 DIAGNOSIS — M9905 Segmental and somatic dysfunction of pelvic region: Secondary | ICD-10-CM | POA: Diagnosis not present

## 2024-08-04 DIAGNOSIS — Z419 Encounter for procedure for purposes other than remedying health state, unspecified: Secondary | ICD-10-CM | POA: Diagnosis not present

## 2024-08-07 DIAGNOSIS — M9903 Segmental and somatic dysfunction of lumbar region: Secondary | ICD-10-CM | POA: Diagnosis not present

## 2024-08-07 DIAGNOSIS — M9902 Segmental and somatic dysfunction of thoracic region: Secondary | ICD-10-CM | POA: Diagnosis not present

## 2024-08-07 DIAGNOSIS — M9905 Segmental and somatic dysfunction of pelvic region: Secondary | ICD-10-CM | POA: Diagnosis not present

## 2024-08-07 DIAGNOSIS — M5442 Lumbago with sciatica, left side: Secondary | ICD-10-CM | POA: Diagnosis not present

## 2024-08-10 DIAGNOSIS — M5442 Lumbago with sciatica, left side: Secondary | ICD-10-CM | POA: Diagnosis not present

## 2024-08-10 DIAGNOSIS — M9902 Segmental and somatic dysfunction of thoracic region: Secondary | ICD-10-CM | POA: Diagnosis not present

## 2024-08-10 DIAGNOSIS — M9905 Segmental and somatic dysfunction of pelvic region: Secondary | ICD-10-CM | POA: Diagnosis not present

## 2024-08-10 DIAGNOSIS — M9903 Segmental and somatic dysfunction of lumbar region: Secondary | ICD-10-CM | POA: Diagnosis not present

## 2024-09-04 DIAGNOSIS — Z419 Encounter for procedure for purposes other than remedying health state, unspecified: Secondary | ICD-10-CM | POA: Diagnosis not present

## 2024-10-26 ENCOUNTER — Ambulatory Visit: Admission: EM | Admit: 2024-10-26 | Discharge: 2024-10-26 | Disposition: A | Discharge status: Home / Self Care

## 2024-10-26 DIAGNOSIS — K921 Melena: Secondary | ICD-10-CM

## 2024-10-26 NOTE — ED Provider Notes (Signed)
 [facility]    CSN: [id number] Arrival date & time: 10/26/24  1814      History   Chief Complaint No chief complaint on file.   HPI Catherine Park is a 28 y.o. female.   The history is provided by the patient.   Patient presents for complaints of blood in her stool.  Patient states she was sitting on the floor playing with her daughter.  States that she experienced a pain in her stomach.  She states shortly thereafter, she had a bowel movement that looked like it had mucus and was blood-tinged.  States that she noticed the blood in her underwear.  Patient states after she wiped, she did not notice any more blood and has not noticed any more blood since.  Patient states that she has had intermittent soft stools over the past several months.  She denies fever, chills, abdominal pain, gas, bloating, diarrhea, or constipation.  Patient reports that she is 10 months postpartum.  States that she does have a history of hemorrhoids, but they only bother her during pregnancy.  Patient denies further history of gastrointestinal disease.  Past Medical History:  Diagnosis Date   Hyperlipidemia    Medical history non-contributory     Patient Active Problem List   Diagnosis Date Noted   Status post surgery 02/28/2024   Acute appendicitis 02/25/2024    Past Surgical History:  Procedure Laterality Date   LAPAROSCOPIC APPENDECTOMY N/A 02/25/2024   Procedure: APPENDECTOMY, LAPAROSCOPIC;  Surgeon: Lyndel Deward PARAS, MD;  Location: [facility] OR;  Service: General;  Laterality: N/A;   WISDOM TOOTH EXTRACTION      OB History     Gravida  2   Para  2   Term  2   Preterm  0   AB  0   Living  2      SAB  0   IAB  0   Ectopic  0   Multiple  0   Live Births  2            Home Medications    Prior to Admission medications   Not on File    Family History Family History  Problem Relation Age of Onset   Hyperlipidemia Mother     Social  History Social History   Tobacco Use   Smoking status: Never   Smokeless tobacco: Never  Vaping Use   Vaping status: Never Used  Substance Use Topics   Alcohol use: No   Drug use: No     Allergies   Flagyl  [metronidazole ]   Review of Systems Review of Systems Per HPI  Physical Exam Triage Vital Signs ED Triage Vitals  Encounter Vitals Group     BP 10/26/24 1824 (!) 124/90     Girls Systolic BP Percentile --      Girls Diastolic BP Percentile --      Boys Systolic BP Percentile --      Boys Diastolic BP Percentile --      Pulse Rate 10/26/24 1824 80     Resp 10/26/24 1824 20     Temp 10/26/24 1824 98.3 F (36.8 C)     Temp Source 10/26/24 1824 Oral     SpO2 --      Weight --      Height --      Head Circumference --      Peak Flow --      Pain Score 10/26/24 1827 0  Pain Loc --      Pain Education --      Exclude from Growth Chart --    No data found.  Updated Vital Signs BP (!) 124/90 (BP Location: Right Arm)   Pulse 80   Temp 98.3 F (36.8 C) (Oral)   Resp 20   Breastfeeding Yes   Visual Acuity Right Eye Distance:   Left Eye Distance:   Bilateral Distance:    Right Eye Near:   Left Eye Near:    Bilateral Near:     Physical Exam Vitals and nursing note reviewed.  Constitutional:      General: She is not in acute distress.    Appearance: Normal appearance.  HENT:     Head: Normocephalic.  Eyes:     Extraocular Movements: Extraocular movements intact.     Pupils: Pupils are equal, round, and reactive to light.  Cardiovascular:     Rate and Rhythm: Normal rate and regular rhythm.     Pulses: Normal pulses.     Heart sounds: Normal heart sounds.  Pulmonary:     Effort: Pulmonary effort is normal. No respiratory distress.     Breath sounds: Normal breath sounds. No stridor. No wheezing, rhonchi or rales.  Abdominal:     General: Bowel sounds are normal. There is no distension.     Palpations: Abdomen is soft.     Tenderness: There is  no abdominal tenderness. There is no guarding or rebound.  Musculoskeletal:     Cervical back: Normal range of motion.  Skin:    General: Skin is warm.  Neurological:     General: No focal deficit present.     Mental Status: She is alert and oriented to person, place, and time.  Psychiatric:        Mood and Affect: Mood normal.        Behavior: Behavior normal.      UC Treatments / Results  Labs (all labs ordered are listed, but only abnormal results are displayed) Labs Reviewed - No data to display  EKG   Radiology No results found.  Procedures Procedures (including critical care time)  Medications Ordered in UC Medications - No data to display  Initial Impression / Assessment and Plan / UC Course  I have reviewed the triage vital signs and the nursing notes.  Pertinent labs & imaging results that were available during my care of the patient were reviewed by me and considered in my medical decision making (see chart for details).  On exam, the patient is well-appearing, she is in no acute distress, vital signs are stable.  She does not exhibit any abdominal tenderness, fever, chills, or ongoing episodes of blood in her stool.  Do not suspect acute abdomen at this time.  Cannot rule out hemorrhoids as she does have a 46-month-old at this time.  Supportive care recommendations were provided discussed with the patient to include increasing the fiber in her diet to add bulk to her stool, fluids, rest, and to monitor for worsening.  Patient was given strict ER follow-up precautions.  She currently does not have a PCP, patient was assisted in scheduling appointment with primary care for ongoing follow-up.  Patient was advised if symptoms fail to improve, it is recommended that she follow-up with primary care or with gastroenterology for further evaluation.  Patient was in agreement with this plan of care and verbalizes understanding.  All questions were answered.  Patient stable for  discharge.  Final Clinical  Impressions(s) / UC Diagnoses   Final diagnoses:  None   Discharge Instructions   None    ED Prescriptions   None    PDMP not reviewed this encounter.   Gilmer Etta PARAS, NP 10/26/24 1845

## 2024-10-26 NOTE — Discharge Instructions (Addendum)
 Continue to monitor your symptoms for worsening. As discussed, if you develop worsening rectal bleeding, abdominal pain, nausea, vomiting, or other concerns, please go to the emergency department immediately for further evaluation. Increase fluids.  Make sure you drink at least 8-10 8 ounce glasses of water daily. Recommended diet to help increase the fiber in your stool.  I have given you some information that you can refer to. Follow-up with primary care as scheduled. Follow-up as needed.

## 2024-10-26 NOTE — ED Triage Notes (Signed)
 Pt reports blood in stool after BM, pt states she had abdominal pain, then she went to the bathroom, and notice a clear mucus, stool and blood mix in her underwear.

## 2024-11-12 ENCOUNTER — Ambulatory Visit
Admission: EM | Admit: 2024-11-12 | Discharge: 2024-11-12 | Disposition: A | Discharge status: Home / Self Care | Attending: Family Medicine | Admitting: Family Medicine

## 2024-11-12 DIAGNOSIS — S39012A Strain of muscle, fascia and tendon of lower back, initial encounter: Secondary | ICD-10-CM

## 2024-11-12 MED ORDER — KETOROLAC TROMETHAMINE 30 MG/ML IJ SOLN
30.0000 mg | Freq: Once | INTRAMUSCULAR | Status: AC
  Administered 2024-11-12: 30 mg via INTRAMUSCULAR

## 2024-11-12 MED ORDER — TIZANIDINE HCL 4 MG PO TABS: 4.0000 mg | ORAL_TABLET | Freq: Three times a day (TID) | ORAL | 0 refills | Status: AC | PRN

## 2024-11-12 NOTE — ED Provider Notes (Signed)
 [facility]    CSN: [id number] Arrival date & time: 11/12/24  1517      History   Chief Complaint No chief complaint on file.   HPI Catherine Park is a 28 y.o. female.   Patient presenting today with new onset bilateral lower back pain, stiffness, soreness following bending down to get her daughter out of the tub this morning around 10 AM.  She states she has had this flareup multiple times in the past and usually can wait to go to the chiropractor but the pain was so severe that she did not feel she could wait today.  Denies radiation of pain down legs, numbness, tingling, weakness, bowel or bladder incontinence, saddle anesthesia, fever, urinary symptoms.  So far not trying anything over-the-counter for symptoms.    Past Medical History:  Diagnosis Date   Hyperlipidemia    Medical history non-contributory     Patient Active Problem List   Diagnosis Date Noted   Status post surgery 02/28/2024   Acute appendicitis 02/25/2024    Past Surgical History:  Procedure Laterality Date   LAPAROSCOPIC APPENDECTOMY N/A 02/25/2024   Procedure: APPENDECTOMY, LAPAROSCOPIC;  Surgeon: Lyndel Deward PARAS, MD;  Location: [facility] OR;  Service: General;  Laterality: N/A;   WISDOM TOOTH EXTRACTION      OB History     Gravida  2   Para  2   Term  2   Preterm  0   AB  0   Living  2      SAB  0   IAB  0   Ectopic  0   Multiple  0   Live Births  2            Home Medications    Prior to Admission medications   Medication Sig Start Date End Date Taking? Authorizing Provider  tiZANidine (ZANAFLEX) 4 MG tablet Take 1 tablet (4 mg total) by mouth every 8 (eight) hours as needed for muscle spasms. Do not drink alcohol or drive while taking this medication.  May cause drowsiness. 11/12/24  Yes Stuart Vernell Norris, PA-C    Family History Family History  Problem Relation Age of Onset   Hyperlipidemia Mother     Social History Social History    Tobacco Use   Smoking status: Never   Smokeless tobacco: Never  Vaping Use   Vaping status: Never Used  Substance Use Topics   Alcohol use: No   Drug use: No     Allergies   Flagyl  [metronidazole ]   Review of Systems Review of Systems PER HPI  Physical Exam Triage Vital Signs ED Triage Vitals  Encounter Vitals Group     BP 11/12/24 1521 107/68     Girls Systolic BP Percentile --      Girls Diastolic BP Percentile --      Boys Systolic BP Percentile --      Boys Diastolic BP Percentile --      Pulse Rate 11/12/24 1521 82     Resp 11/12/24 1521 18     Temp 11/12/24 1521 98.7 F (37.1 C)     Temp Source 11/12/24 1521 Oral     SpO2 11/12/24 1521 96 %     Weight --      Height --      Head Circumference --      Peak Flow --      Pain Score 11/12/24 1523 10     Pain Loc --  Pain Education --      Exclude from Growth Chart --    No data found.  Updated Vital Signs BP 107/68 (BP Location: Right Arm)   Pulse 82   Temp 98.7 F (37.1 C) (Oral)   Resp 18   SpO2 96%   Breastfeeding No   Visual Acuity Right Eye Distance:   Left Eye Distance:   Bilateral Distance:    Right Eye Near:   Left Eye Near:    Bilateral Near:     Physical Exam Vitals and nursing note reviewed.  Constitutional:      Appearance: Normal appearance. She is not ill-appearing.  HENT:     Head: Atraumatic.  Eyes:     Extraocular Movements: Extraocular movements intact.     Conjunctiva/sclera: Conjunctivae normal.  Cardiovascular:     Rate and Rhythm: Normal rate.  Pulmonary:     Effort: Pulmonary effort is normal.  Musculoskeletal:        General: Tenderness present. No swelling. Normal range of motion.     Cervical back: Normal range of motion and neck supple.     Comments: Negative straight leg raise bilateral lower extremities.  No midline spinal tenderness to palpation diffusely.  Bilateral lumbar paraspinal musculature tender to palpation  Skin:    General: Skin is warm  and dry.  Neurological:     Mental Status: She is alert and oriented to person, place, and time.     Comments: Bilateral lower extremities neurovascularly intact  Psychiatric:        Mood and Affect: Mood normal.        Thought Content: Thought content normal.        Judgment: Judgment normal.      UC Treatments / Results  Labs (all labs ordered are listed, but only abnormal results are displayed) Labs Reviewed - No data to display  EKG   Radiology No results found.  Procedures Procedures (including critical care time)  Medications Ordered in UC Medications  ketorolac  (TORADOL ) 30 MG/ML injection 30 mg (30 mg Intramuscular Given 11/12/24 1558)    Initial Impression / Assessment and Plan / UC Course  I have reviewed the triage vital signs and the nursing notes.  Pertinent labs & imaging results that were available during my care of the patient were reviewed by me and considered in my medical decision making (see chart for details).     Will treat with IM Toradol , Zanaflex , heat, soft, stretches, rest.  Return for worsening or unresolving symptoms.  No red flag findings today.  Final Clinical Impressions(s) / UC Diagnoses   Final diagnoses:  Strain of lumbar region, initial encounter   Discharge Instructions   None    ED Prescriptions     Medication Sig Dispense Auth. Provider   tiZANidine  (ZANAFLEX ) 4 MG tablet Take 1 tablet (4 mg total) by mouth every 8 (eight) hours as needed for muscle spasms. Do not drink alcohol or drive while taking this medication.  May cause drowsiness. 15 tablet Stuart Vernell Norris, [place]      PDMP not reviewed this encounter.   Stuart Vernell Norris, [place] 11/12/24 1601

## 2024-11-12 NOTE — ED Triage Notes (Signed)
 T reports lower back pain, started today around 10:00 am after getting her daughter out of the tub.

## 2024-11-13 DIAGNOSIS — M9915 Subluxation complex (vertebral) of pelvic region: Secondary | ICD-10-CM | POA: Diagnosis not present

## 2024-11-13 DIAGNOSIS — M546 Pain in thoracic spine: Secondary | ICD-10-CM | POA: Diagnosis not present

## 2024-11-13 DIAGNOSIS — M9912 Subluxation complex (vertebral) of thoracic region: Secondary | ICD-10-CM | POA: Diagnosis not present

## 2024-11-13 DIAGNOSIS — M62830 Muscle spasm of back: Secondary | ICD-10-CM | POA: Diagnosis not present

## 2024-11-13 DIAGNOSIS — M9913 Subluxation complex (vertebral) of lumbar region: Secondary | ICD-10-CM | POA: Diagnosis not present

## 2024-11-16 DIAGNOSIS — M62830 Muscle spasm of back: Secondary | ICD-10-CM | POA: Diagnosis not present

## 2024-11-16 DIAGNOSIS — M9913 Subluxation complex (vertebral) of lumbar region: Secondary | ICD-10-CM | POA: Diagnosis not present

## 2024-11-16 DIAGNOSIS — M9912 Subluxation complex (vertebral) of thoracic region: Secondary | ICD-10-CM | POA: Diagnosis not present

## 2024-11-16 DIAGNOSIS — M546 Pain in thoracic spine: Secondary | ICD-10-CM | POA: Diagnosis not present

## 2024-11-16 DIAGNOSIS — M9915 Subluxation complex (vertebral) of pelvic region: Secondary | ICD-10-CM | POA: Diagnosis not present

## 2024-11-30 DIAGNOSIS — M9915 Subluxation complex (vertebral) of pelvic region: Secondary | ICD-10-CM | POA: Diagnosis not present

## 2024-11-30 DIAGNOSIS — M9913 Subluxation complex (vertebral) of lumbar region: Secondary | ICD-10-CM | POA: Diagnosis not present

## 2024-11-30 DIAGNOSIS — M546 Pain in thoracic spine: Secondary | ICD-10-CM | POA: Diagnosis not present

## 2024-11-30 DIAGNOSIS — M62830 Muscle spasm of back: Secondary | ICD-10-CM | POA: Diagnosis not present

## 2024-11-30 DIAGNOSIS — M9912 Subluxation complex (vertebral) of thoracic region: Secondary | ICD-10-CM | POA: Diagnosis not present

## 2024-12-04 DIAGNOSIS — Z419 Encounter for procedure for purposes other than remedying health state, unspecified: Secondary | ICD-10-CM | POA: Diagnosis not present

## 2024-12-14 DIAGNOSIS — M546 Pain in thoracic spine: Secondary | ICD-10-CM | POA: Diagnosis not present

## 2024-12-14 DIAGNOSIS — M9915 Subluxation complex (vertebral) of pelvic region: Secondary | ICD-10-CM | POA: Diagnosis not present

## 2024-12-14 DIAGNOSIS — M9912 Subluxation complex (vertebral) of thoracic region: Secondary | ICD-10-CM | POA: Diagnosis not present

## 2024-12-14 DIAGNOSIS — M62830 Muscle spasm of back: Secondary | ICD-10-CM | POA: Diagnosis not present

## 2024-12-14 DIAGNOSIS — M9913 Subluxation complex (vertebral) of lumbar region: Secondary | ICD-10-CM | POA: Diagnosis not present
